# Patient Record
Sex: Male | Born: 1935 | Race: White | Hispanic: No | Smoking: Never smoker
Health system: Southern US, Community
[De-identification: ages and names within clinical notes are randomized; demographics above are authoritative.]

## PROBLEM LIST (undated history)

## (undated) DIAGNOSIS — R251 Tremor, unspecified: Secondary | ICD-10-CM

## (undated) DIAGNOSIS — R7303 Prediabetes: Secondary | ICD-10-CM

## (undated) DIAGNOSIS — M1712 Unilateral primary osteoarthritis, left knee: Secondary | ICD-10-CM

## (undated) DIAGNOSIS — Z87438 Personal history of other diseases of male genital organs: Secondary | ICD-10-CM

## (undated) DIAGNOSIS — I2109 ST elevation (STEMI) myocardial infarction involving other coronary artery of anterior wall: Secondary | ICD-10-CM

## (undated) DIAGNOSIS — K4020 Bilateral inguinal hernia, without obstruction or gangrene, not specified as recurrent: Secondary | ICD-10-CM

## (undated) DIAGNOSIS — E7800 Pure hypercholesterolemia, unspecified: Secondary | ICD-10-CM

## (undated) DIAGNOSIS — I739 Peripheral vascular disease, unspecified: Secondary | ICD-10-CM

## (undated) DIAGNOSIS — E039 Hypothyroidism, unspecified: Secondary | ICD-10-CM

## (undated) DIAGNOSIS — I2510 Atherosclerotic heart disease of native coronary artery without angina pectoris: Secondary | ICD-10-CM

## (undated) DIAGNOSIS — I071 Rheumatic tricuspid insufficiency: Secondary | ICD-10-CM

## (undated) DIAGNOSIS — N401 Enlarged prostate with lower urinary tract symptoms: Secondary | ICD-10-CM

## (undated) DIAGNOSIS — I10 Essential (primary) hypertension: Secondary | ICD-10-CM

## (undated) DIAGNOSIS — R350 Frequency of micturition: Secondary | ICD-10-CM

## (undated) DIAGNOSIS — Z862 Personal history of diseases of the blood and blood-forming organs and certain disorders involving the immune mechanism: Secondary | ICD-10-CM

## (undated) HISTORY — PX: EYE SURGERY: SHX253

## (undated) HISTORY — DX: Peripheral vascular disease, unspecified: I73.9

## (undated) HISTORY — DX: Unilateral primary osteoarthritis, left knee: M17.12

## (undated) HISTORY — PX: CATARACT EXTRACTION W/ INTRAOCULAR LENS  IMPLANT, BILATERAL: SHX1307

## (undated) HISTORY — DX: Essential (primary) hypertension: I10

## (undated) HISTORY — DX: Atherosclerotic heart disease of native coronary artery without angina pectoris: I25.10

## (undated) HISTORY — DX: Pure hypercholesterolemia, unspecified: E78.00

## (undated) HISTORY — DX: Hypothyroidism, unspecified: E03.9

## (undated) HISTORY — DX: Benign prostatic hyperplasia with lower urinary tract symptoms: N40.1

## (undated) HISTORY — DX: Personal history of diseases of the blood and blood-forming organs and certain disorders involving the immune mechanism: Z86.2

## (undated) HISTORY — DX: Prediabetes: R73.03

## (undated) HISTORY — DX: ST elevation (STEMI) myocardial infarction involving other coronary artery of anterior wall: I21.09

## (undated) HISTORY — DX: Rheumatic tricuspid insufficiency: I07.1

## (undated) HISTORY — DX: Frequency of micturition: R35.0

## (undated) HISTORY — PX: HERNIA REPAIR: SHX51

---

## 2002-05-04 HISTORY — PX: CORONARY ANGIOPLASTY: SHX604

## 2003-03-20 ENCOUNTER — Other Ambulatory Visit: Payer: Self-pay

## 2003-03-21 ENCOUNTER — Other Ambulatory Visit: Payer: Self-pay

## 2005-04-14 ENCOUNTER — Ambulatory Visit: Payer: Self-pay | Admitting: Internal Medicine

## 2005-04-14 IMAGING — US US EXTREM LOW VENOUS*L*
1 series · 17 of 24 positions shown · non-contrast
Comparison: none

REASON FOR EXAM: Left knee pain, evaluate DVT. Call Report  [PHONE_NUMBER] Ext [[phone]]
COMMENTS:

PROCEDURE:     US  - US DOPPLER LOW EXTR LEFT  - [DATE]  [DATE]
RESULT:     Phasic, augmentation, and Valsalva flow waveforms are normal.
The LEFT femoral and popliteal veins show normal compressibility.  Doppler
examination shows no occlusion or evidence of deep venous thrombosis.

[Series 1: us extrem low venous*left* · 17 of 28 slices shown]
[im 1/28]
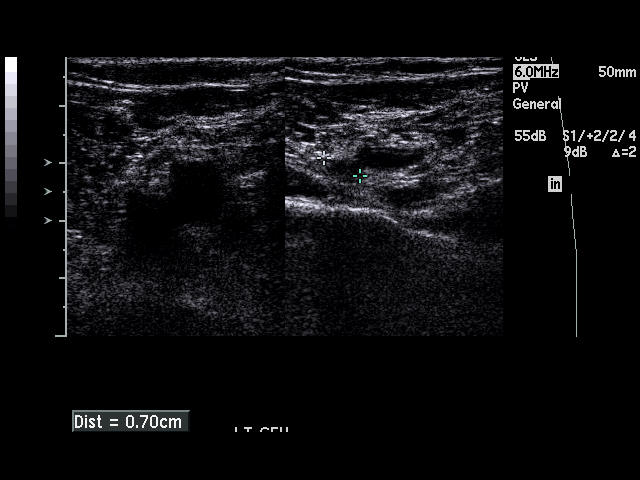
[im 3/28]
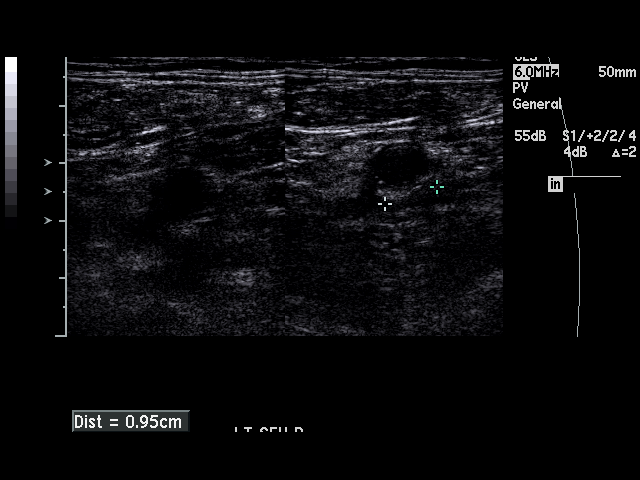
[im 4/28]
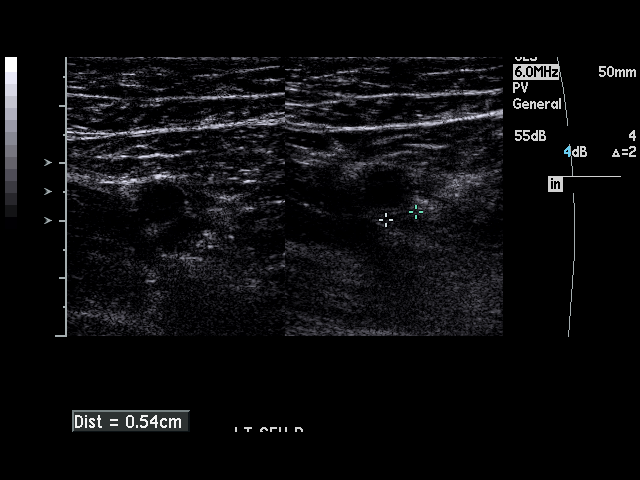
[im 5/28]
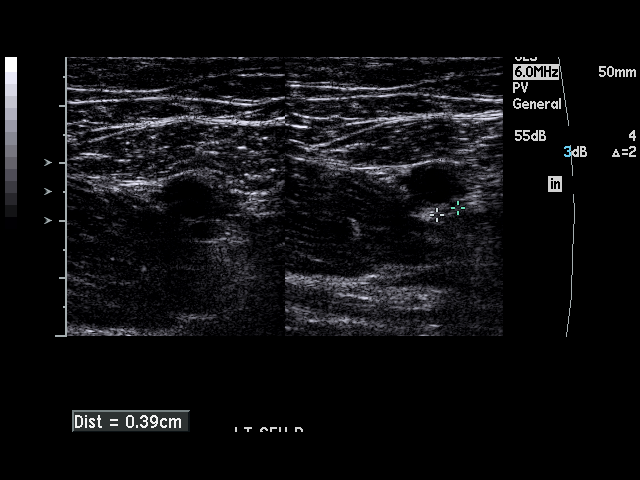
[im 8/28]
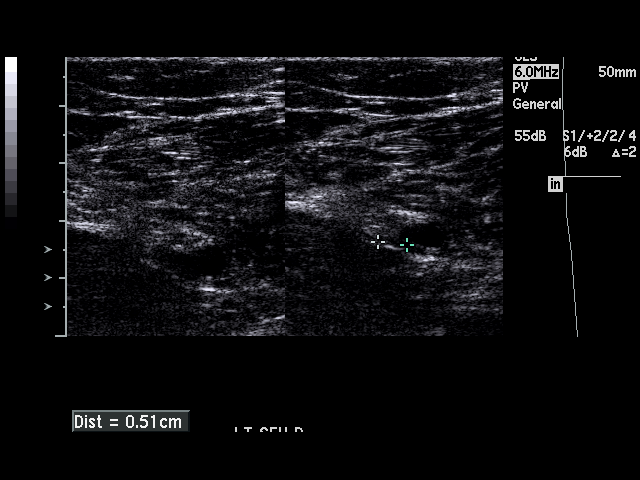
[im 9/28]
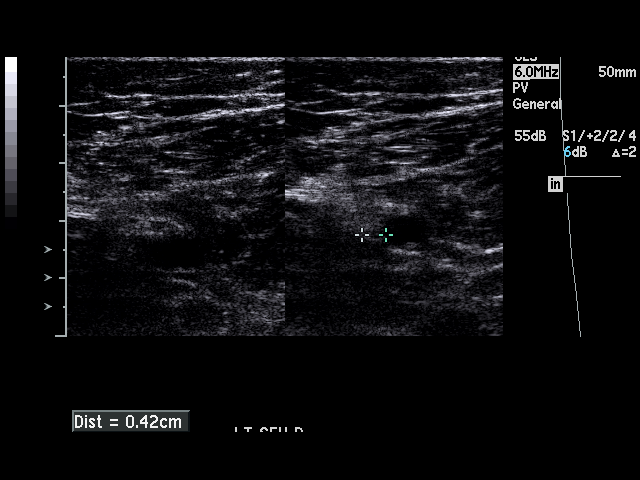
[im 11/28]
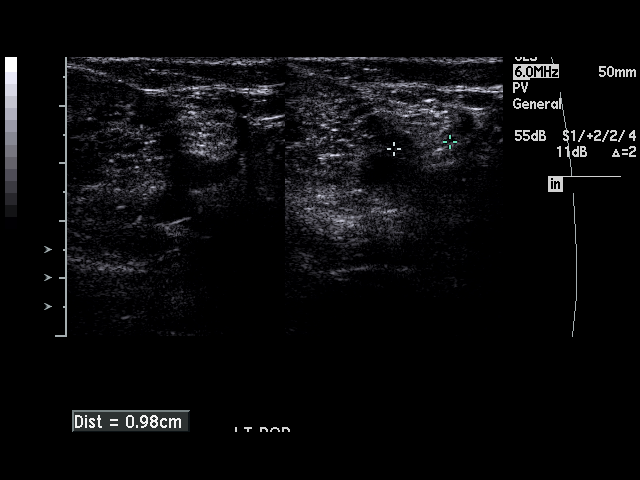
[im 12/28]
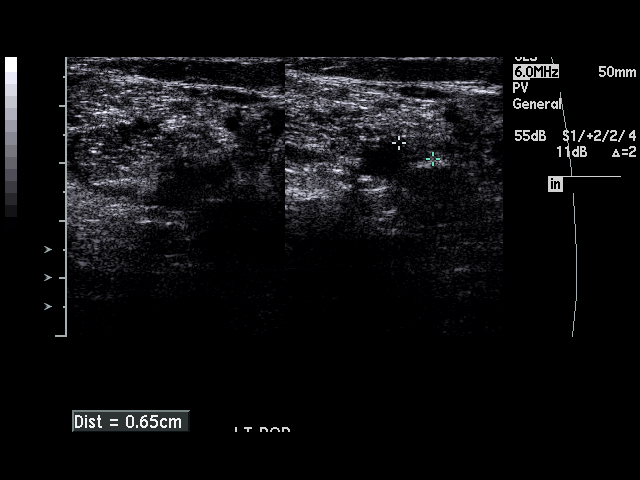
[im 15/28]
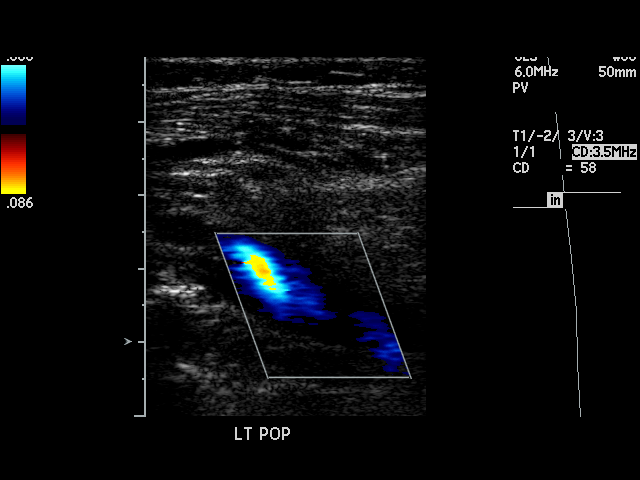
[im 16/28]
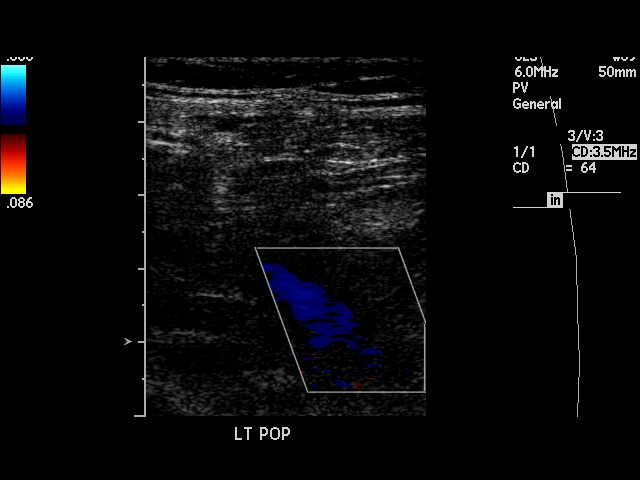
[im 17/28]
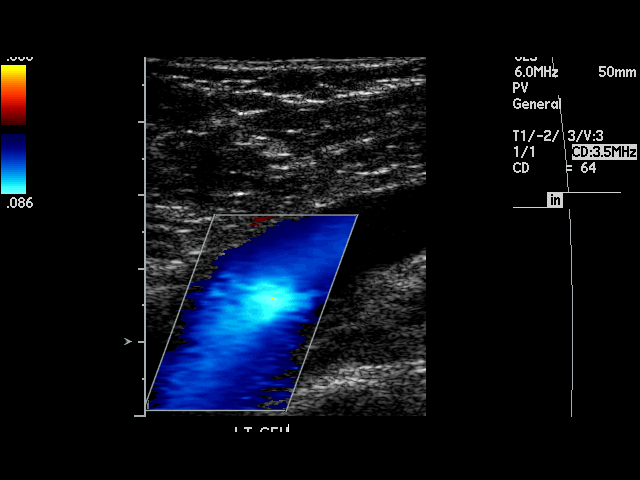
[im 19/28]
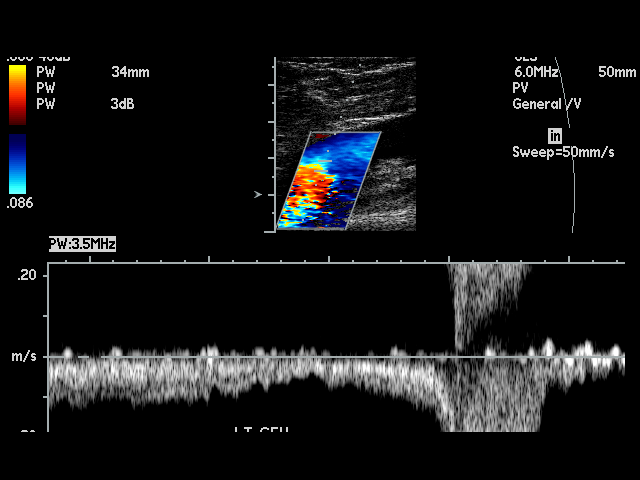
[im 20/28]
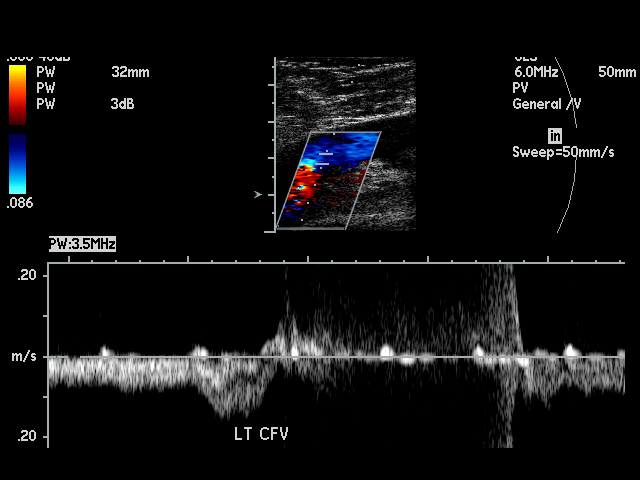
[im 23/28]
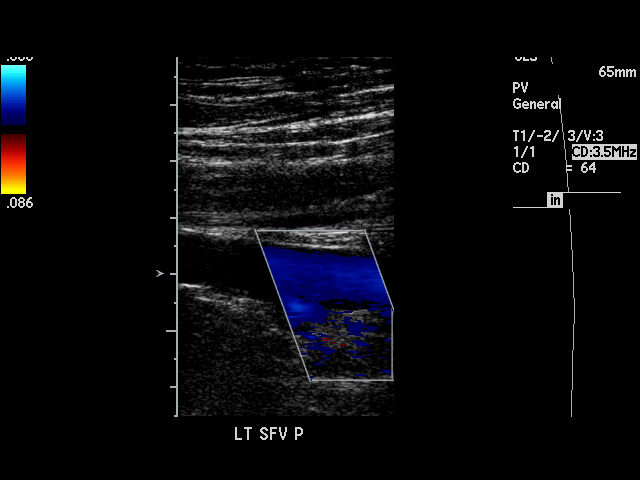
[im 24/28]
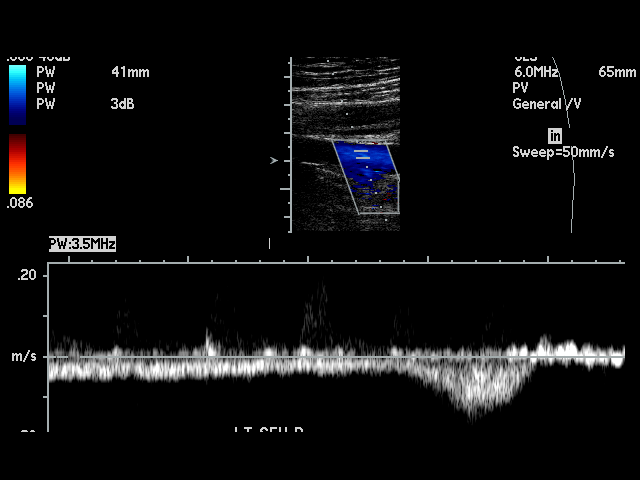
[im 25/28]
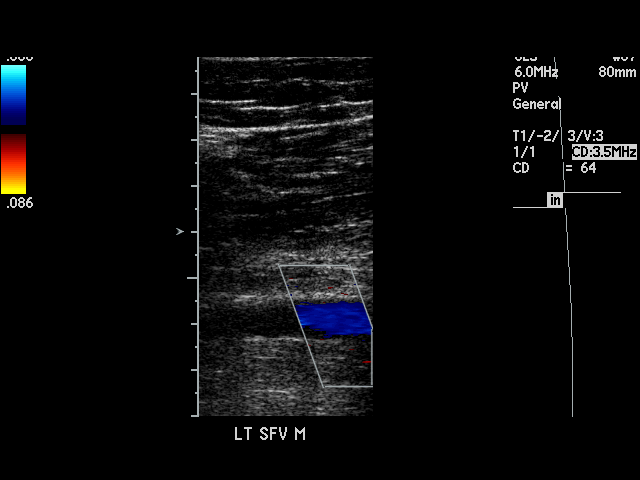
[im 28/28]
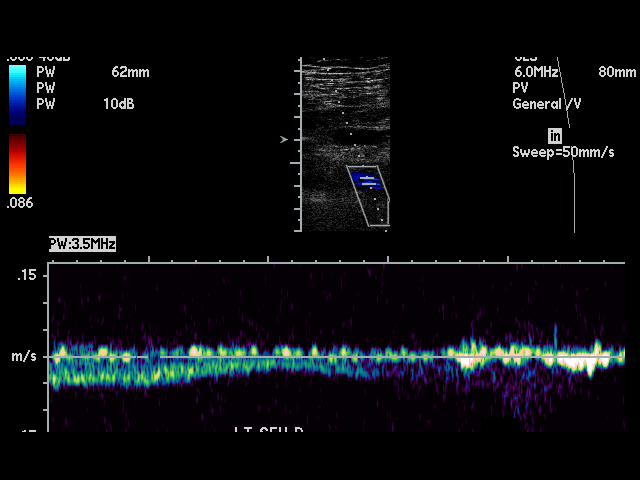

[17 of 24 positions shown; findings below may reference images not displayed]

IMPRESSION: No significant abnormalities are noted.

## 2010-04-10 ENCOUNTER — Emergency Department: Payer: Self-pay

## 2010-04-11 ENCOUNTER — Observation Stay: Payer: Self-pay | Admitting: Internal Medicine

## 2010-05-01 ENCOUNTER — Ambulatory Visit: Payer: Self-pay | Admitting: Unknown Physician Specialty

## 2010-05-01 IMAGING — CT CT ABD-PELV W/ CM
1 of 2 series · 15 of 32 positions shown, 19 images · non-contrast
Comparison: none

REASON FOR EXAM: epigastric pain nausea alone
COMMENTS:

[Series 2: abdomen · axial · 0.76mm/px · z∈[-110,+330]mm · 15 of 98 slices shown, 19 images]
[im 5/98  soft-tissue]
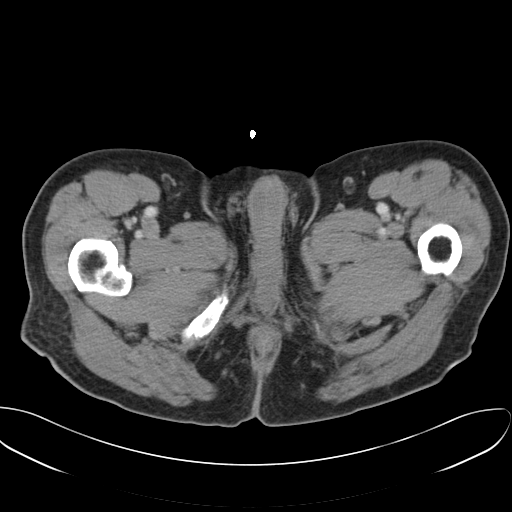
[im 5/98  bone]
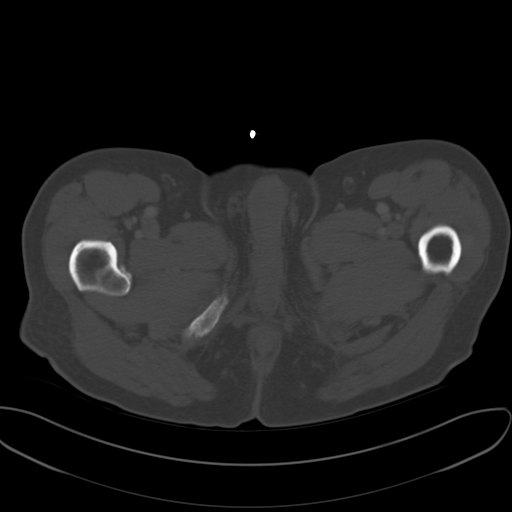
[im 13/98  soft-tissue]
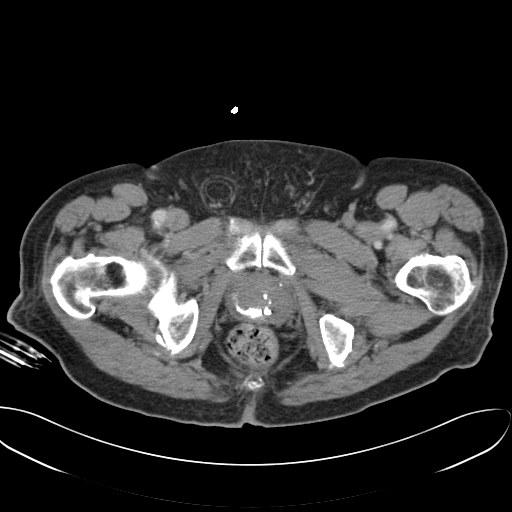
[im 21/98  soft-tissue]
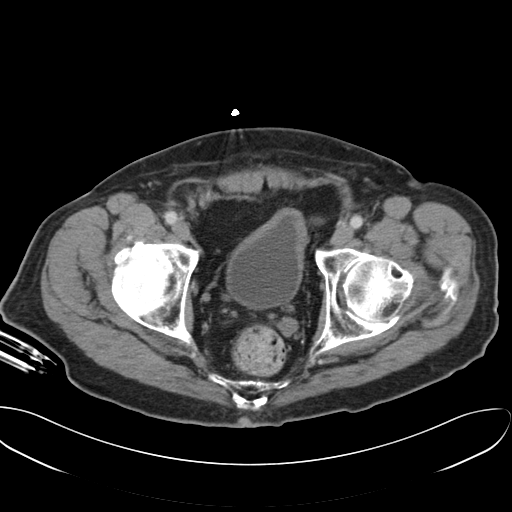
[im 29/98  soft-tissue]
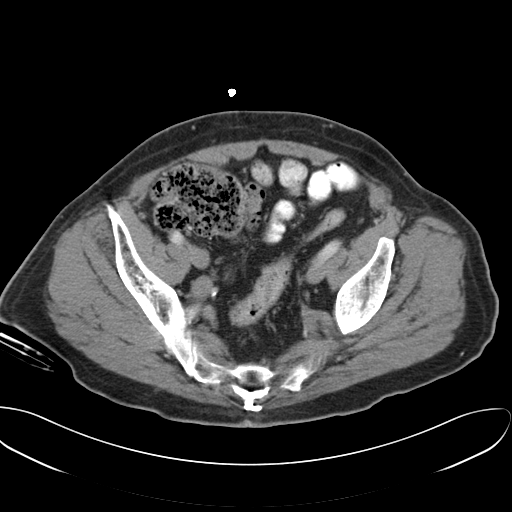
[im 33/98  soft-tissue]
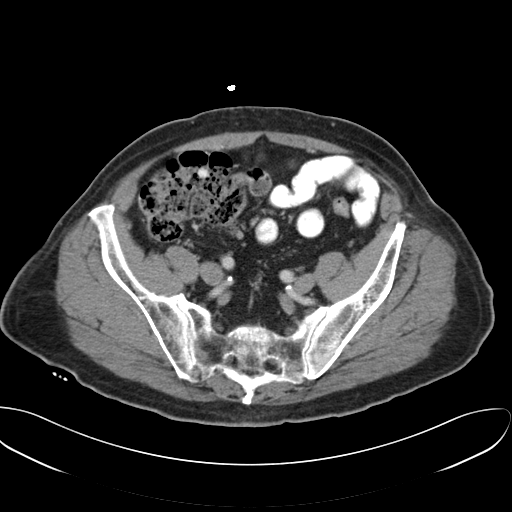
[im 41/98  soft-tissue]
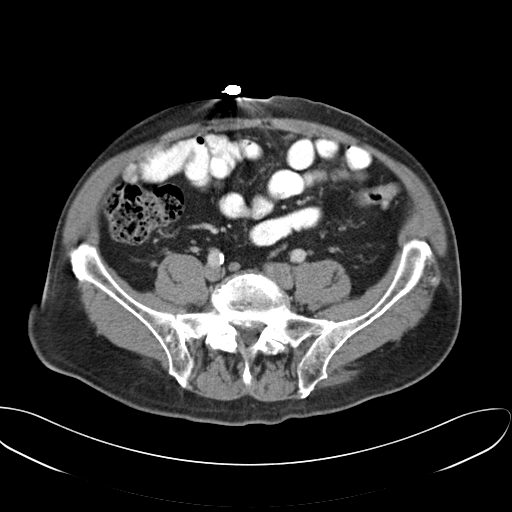
[im 49/98  soft-tissue]
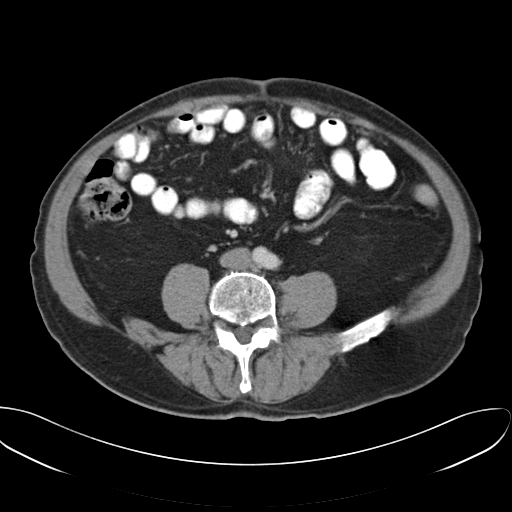
[im 57/98  soft-tissue]
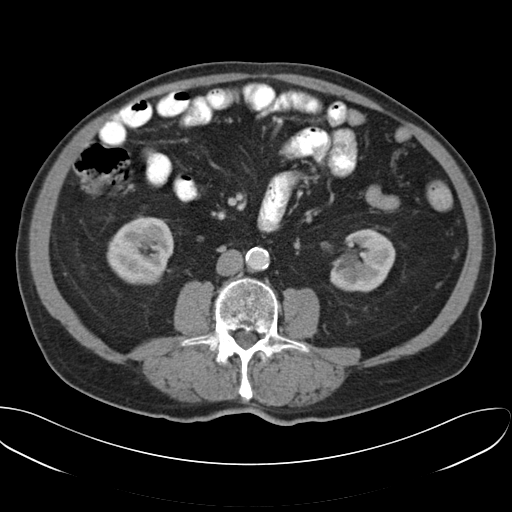
[im 65/98  soft-tissue]
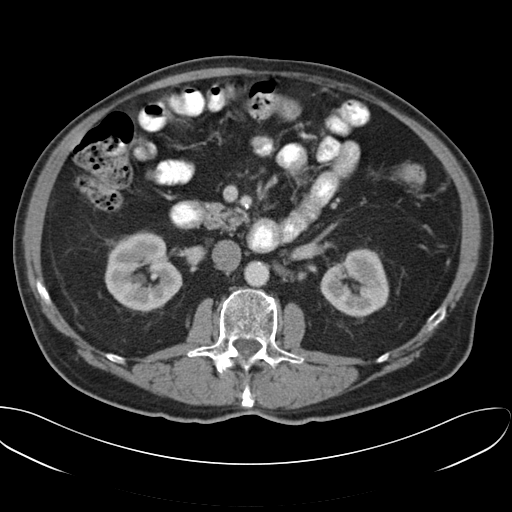
[im 65/98  bone]
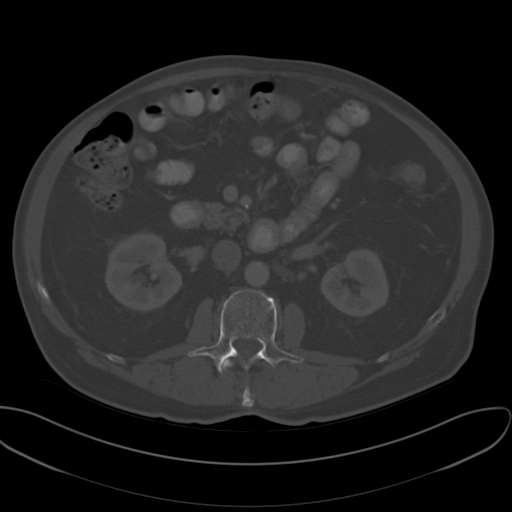
[im 69/98  soft-tissue]
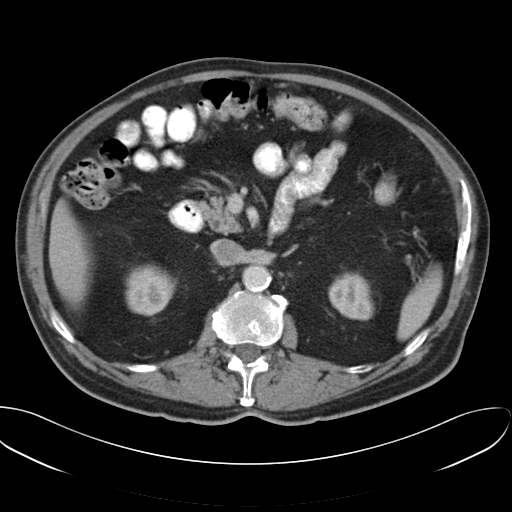
[im 77/98  soft-tissue]
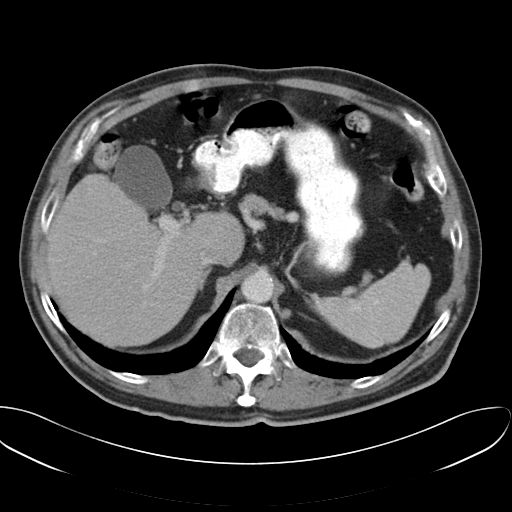
[im 81/98  lung]
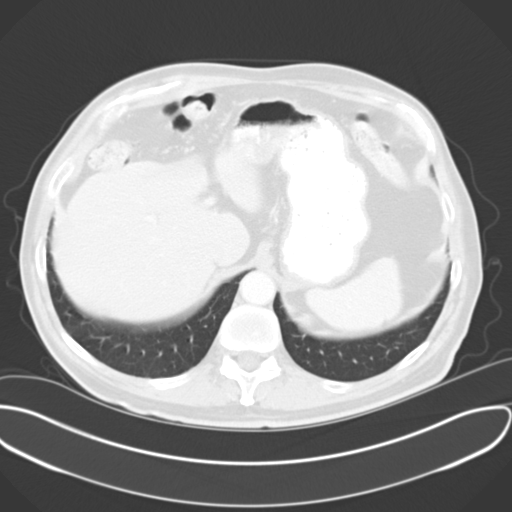
[im 85/98  soft-tissue]
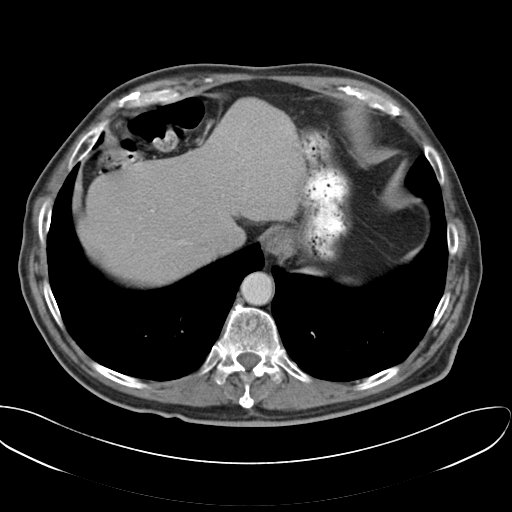
[im 85/98  lung]
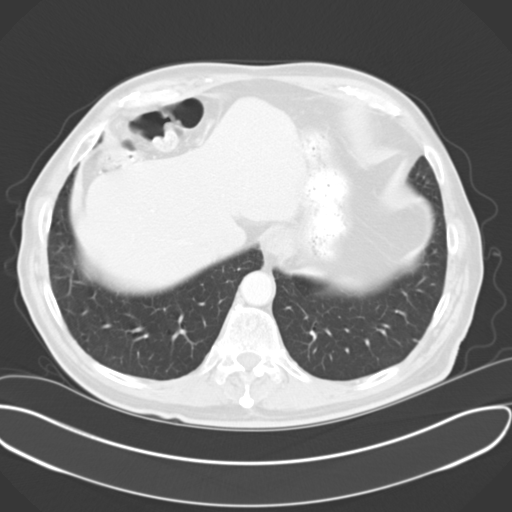
[im 89/98  lung]
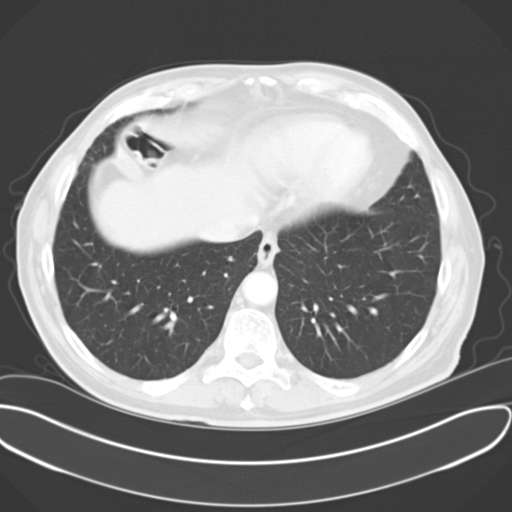
[im 93/98  soft-tissue]
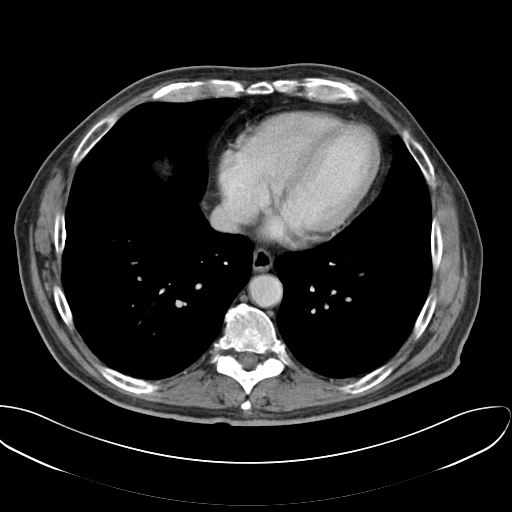
[im 93/98  lung]
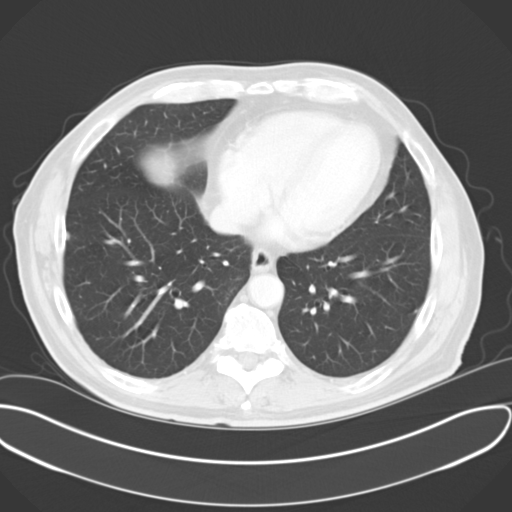

[15 of 32 positions shown; findings below may reference images not displayed]

PROCEDURE:     CT  - CT ABDOMEN / PELVIS  W  - [DATE] [DATE]

RESULT:     Axial CT scanning was performed through the chest at 5 mm
intervals and slice thicknesses following intravenous administration of 85
cc of Isovue 300 and administration of oral contrast material. Review of
multiplanar reconstructed images was performed separately on the via monitor.

The stomach is partially distended with contrast. The orally administered
contrast has traversed much of the small bowel but has not yet reached the
colon. The stool and gas pattern within the colon does not suggest
obstruction. There are bilateral inguinal hernias containing only fat. There
is no  umbilical hernia.

The liver exhibits no focal mass nor ductal dilation. The gallbladder is
adequately distended. There is subtle radiodensity in its dependent portion
which may reflect an enhancing vessel versus a tiny stone. I see no
gallbladder wall thickening or pericholecystic fluid. The pancreas, spleen,
adrenal glands, and kidneys exhibit no acute abnormality. The caliber of the
abdominal aorta is normal. I see no periaortic nor pericaval
lymphadenopathy. Within the pelvis the mildly enlarged prostate gland
produces an impression upon the urinary bladder base. I see no free pelvic
fluid. The seminal vesicles are normal in appearance for age.

The lung bases are clear. The lumbar vertebral bodies are preserved in
height. There is disc space narrowing at L4-L5.
IMPRESSION: 1. I do not see evidence of ileus or bowel obstruction.
2. There is no evidence of acute urinary tract abnormality.
3. There are small bilateral inguinal hernias containing only fat.
4. There is a tiny radiodensity in the dependent portion of the gallbladder
seen on images 27 and 28 which may reflect a tiny adherent stone versus
enhancing vessel. Gallbladder ultrasound would be useful.
4. There is no evidence of an abdominal aortic aneurysm nor of
intra-abdominal or pelvic lymphadenopathy.

## 2010-05-21 ENCOUNTER — Ambulatory Visit: Payer: Self-pay | Admitting: Unknown Physician Specialty

## 2010-05-21 IMAGING — US ABDOMEN ULTRASOUND
1 series · 17 of 25 positions shown · non-contrast
Comparison: none

REASON FOR EXAM: epigastric pain iron deficiency
COMMENTS:

[Series 1: abdomen ultrasound · 17 of 77 slices shown]
[im 1/77]
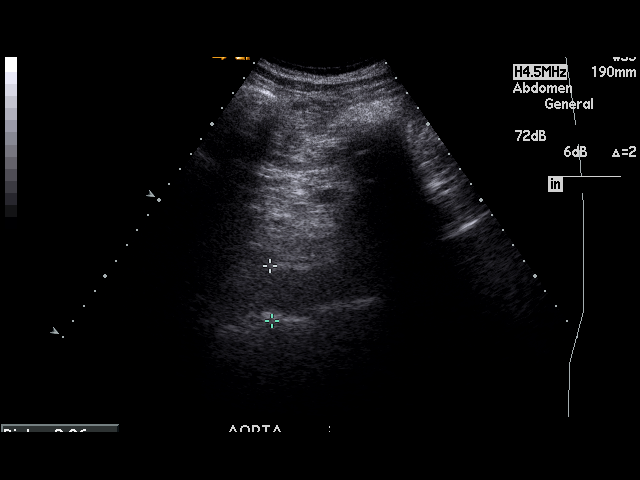
[im 7/77]
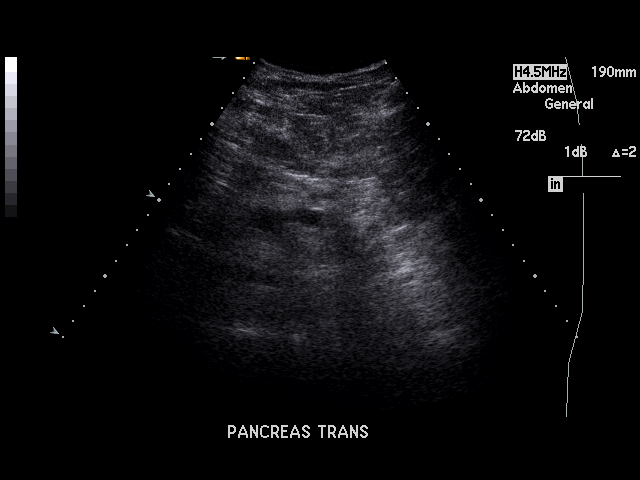
[im 10/77]
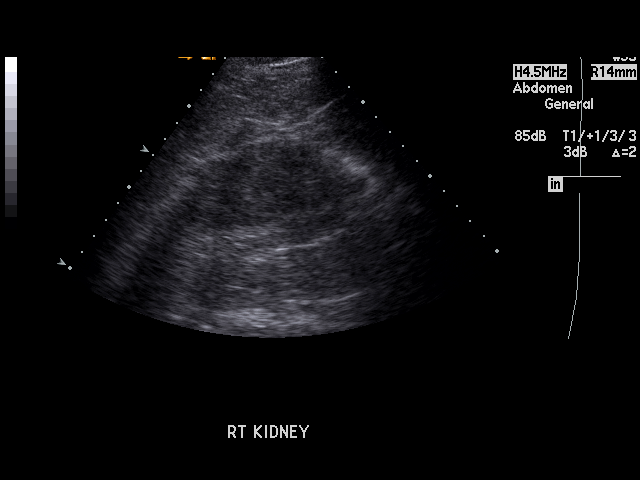
[im 16/77]
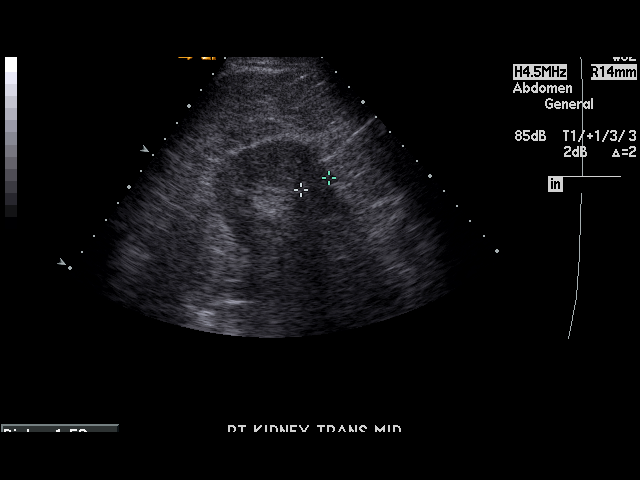
[im 20/77]
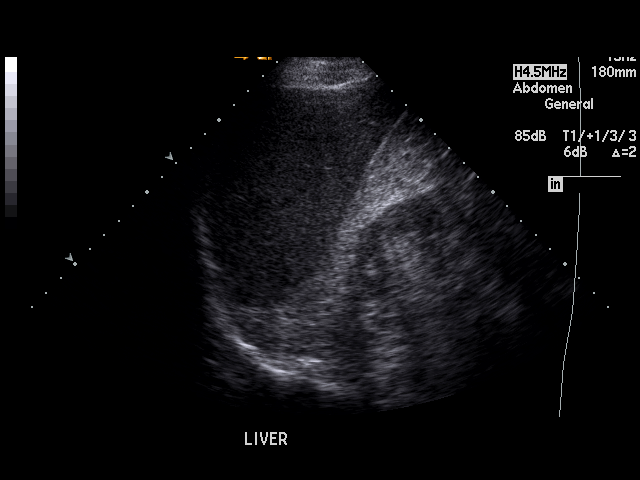
[im 26/77]
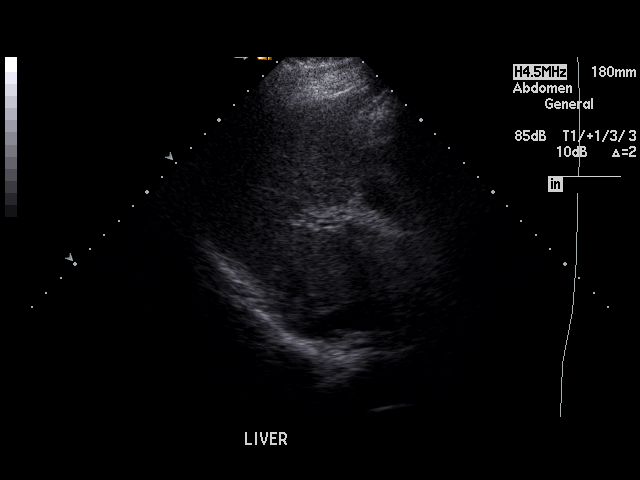
[im 29/77]
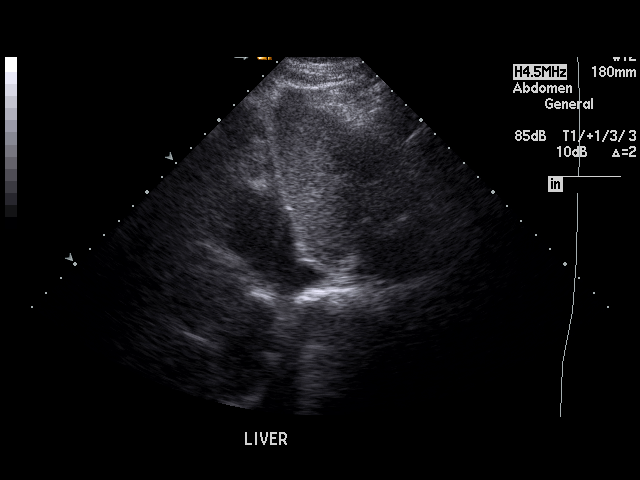
[im 35/77]
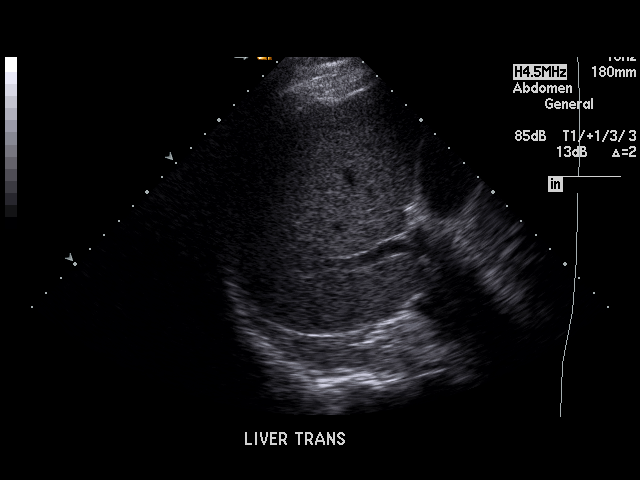
[im 39/77]
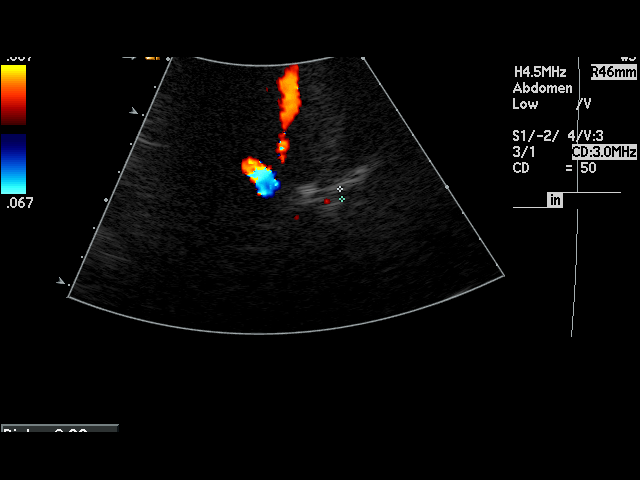
[im 42/77]
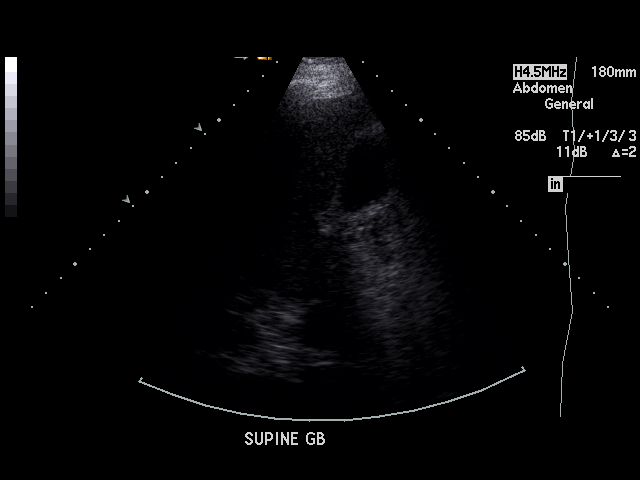
[im 48/77]
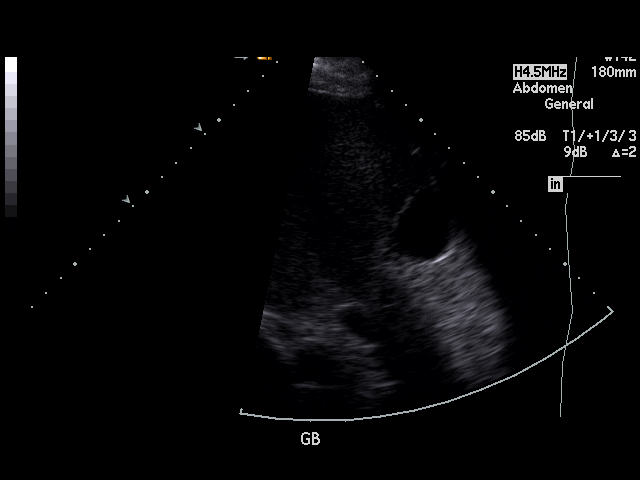
[im 51/77]
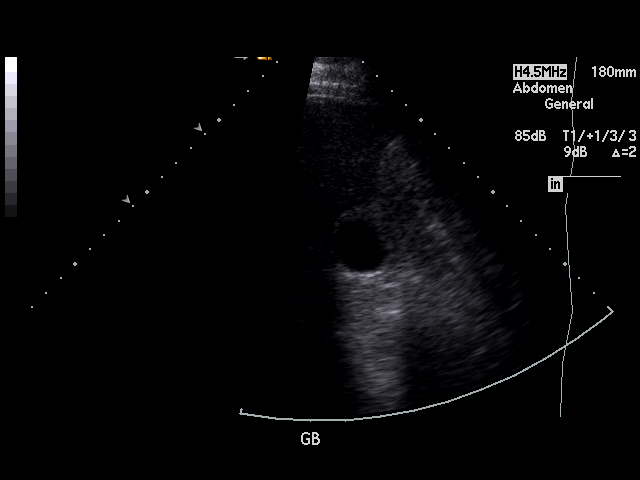
[im 58/77]
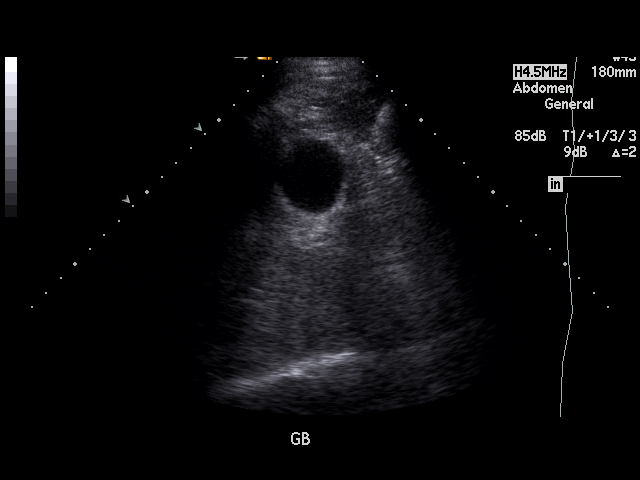
[im 61/77]
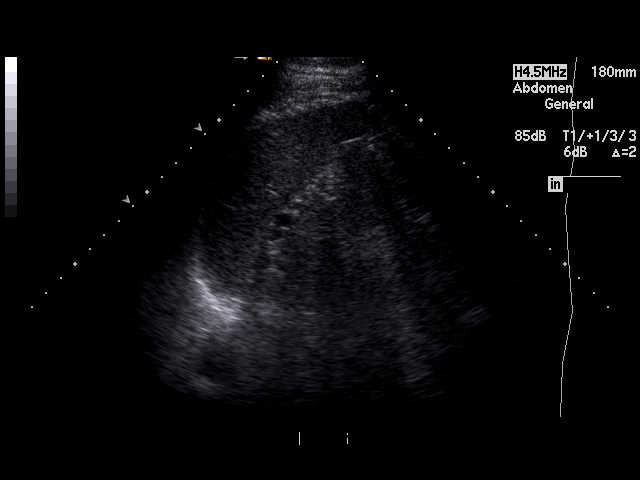
[im 67/77]
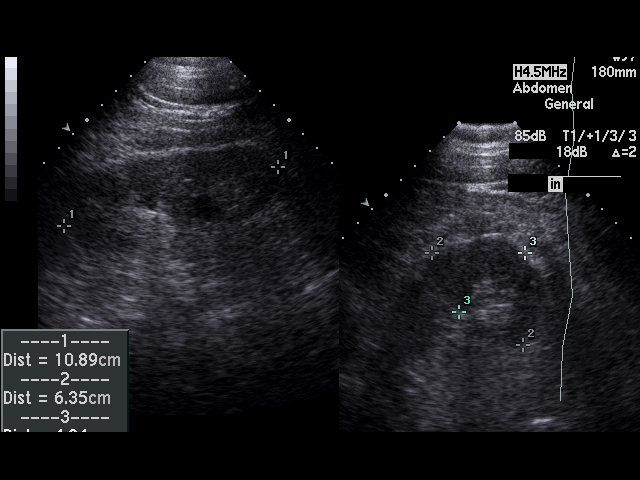
[im 70/77]
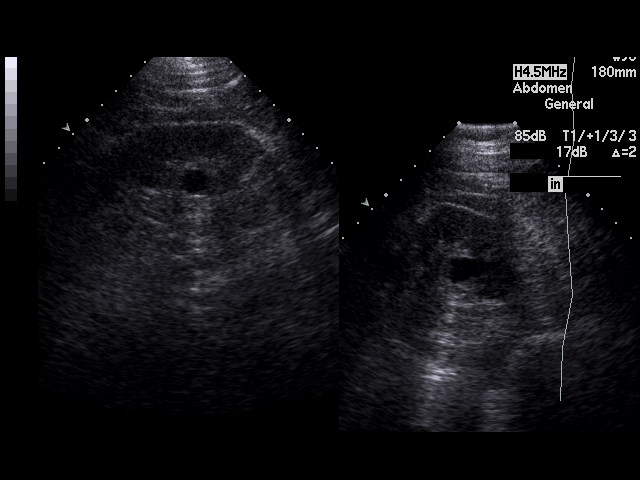
[im 77/77]
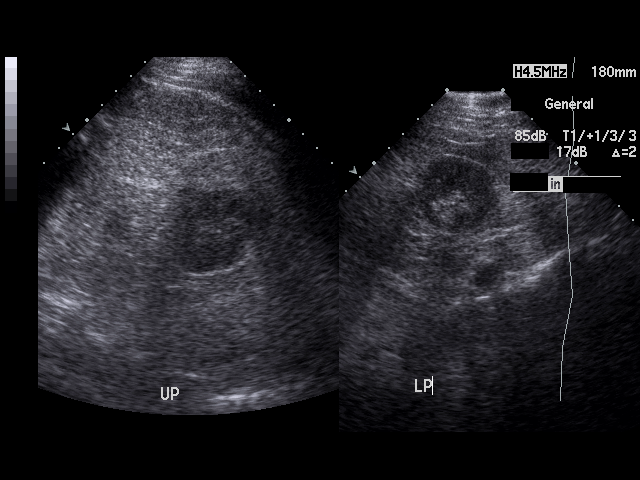

[17 of 25 positions shown; findings below may reference images not displayed]

PROCEDURE:     US  - US ABDOMEN GENERAL SURVEY  - [DATE]  [DATE]

RESULT:     Sonographic evaluation of the abdomen demonstrates the aorta
tapers normally. The pancreas is predominantly obscured by bowel gas. The
right kidney measures 11.28 cm in length possibly containing a
nonobstructing stone in the midpole region. There is a right renal cortical
thickness of 1.52 cm. The hepatic echotexture appears normal. The portal
venous flow is normal. The common bile duct diameter is 3.3 mm. No
gallstones are evident. The gallbladder wall thickness is 2.2 mm. There is
no sonographic Murphy's sign. The spleen is unremarkable. The left kidney
length is 10.89 cm with a mid left renal cyst measuring 1.48 x 1.28 x
cm. The left renal cortical thickness is 1.55 cm.
IMPRESSION: 1. Limited visualization of the pancreas.

2. Mid left renal cyst.

3. Cannot exclude a small, nonobstructing right renal stone.

## 2010-05-30 ENCOUNTER — Ambulatory Visit: Payer: Self-pay | Admitting: Unknown Physician Specialty

## 2012-02-08 ENCOUNTER — Ambulatory Visit: Payer: Self-pay | Admitting: Ophthalmology

## 2012-02-08 LAB — HEMOGLOBIN: HGB: 13.3 g/dL (ref 13.0–18.0)

## 2012-02-23 ENCOUNTER — Ambulatory Visit: Payer: Self-pay | Admitting: Ophthalmology

## 2012-03-16 ENCOUNTER — Ambulatory Visit: Payer: Self-pay | Admitting: Ophthalmology

## 2012-03-16 LAB — HEMOGLOBIN: HGB: 13.2 g/dL (ref 13.0–18.0)

## 2012-03-16 LAB — POTASSIUM: Potassium: 4.5 mmol/L (ref 3.5–5.1)

## 2012-04-05 ENCOUNTER — Ambulatory Visit: Payer: Self-pay | Admitting: Ophthalmology

## 2013-02-09 ENCOUNTER — Emergency Department: Payer: Self-pay | Admitting: Emergency Medicine

## 2013-02-09 IMAGING — US US EXTREM LOW VENOUS*L*
1 series · 14 of 24 positions shown · non-contrast
Comparison: none

REASON FOR EXAM: pain to left upper leg
COMMENTS:

PROCEDURE:     US  - US DOPPLER LOW EXTR LEFT  - [DATE]  [DATE]
RESULT:     Comparison: None

[Series 1: us extrem low venous*left* · 0.11mm/px · 14 of 24 slices shown]
[im 1/24]
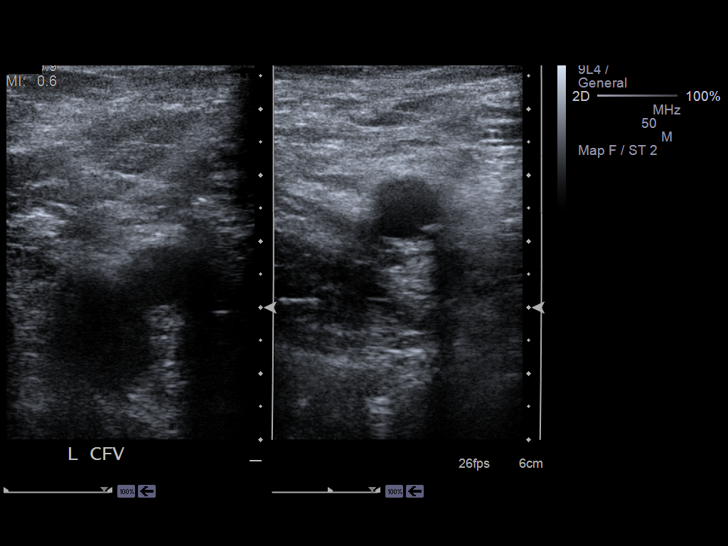
[im 3/24]
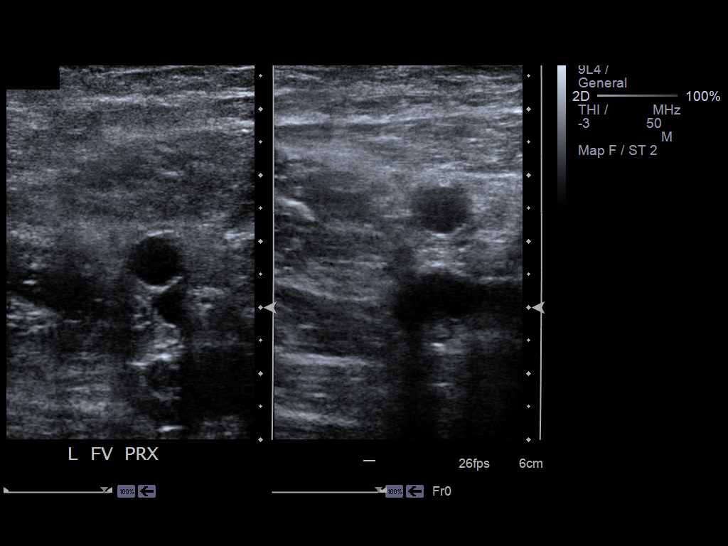
[im 5/24]
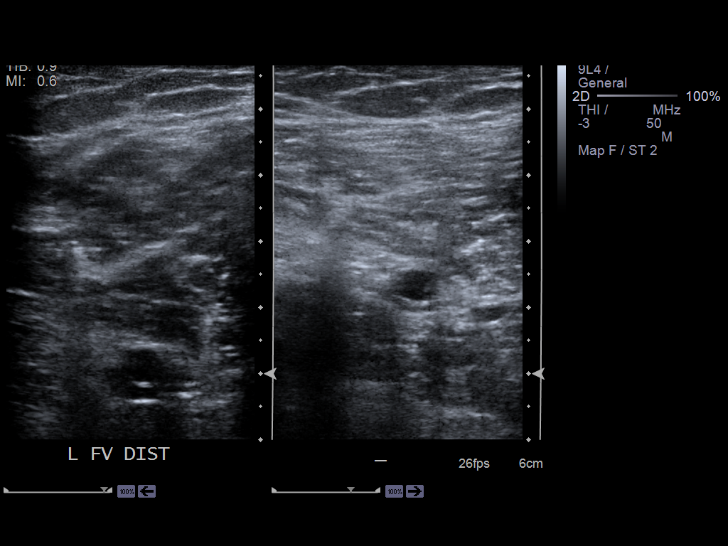
[im 7/24]
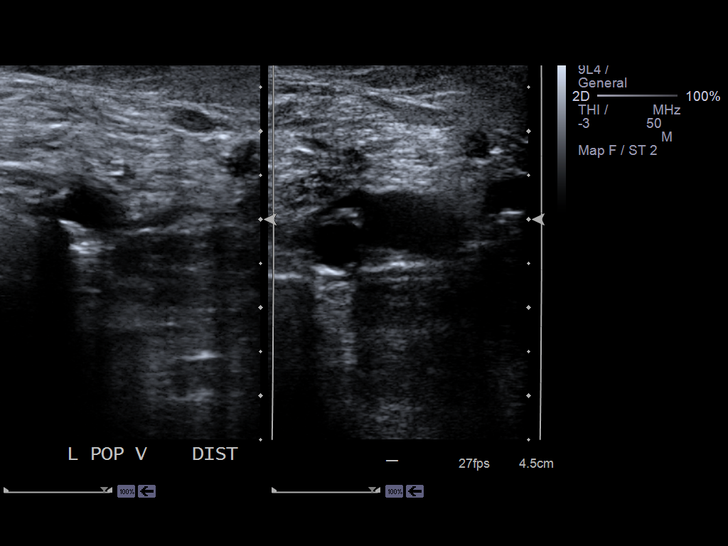
[im 8/24]
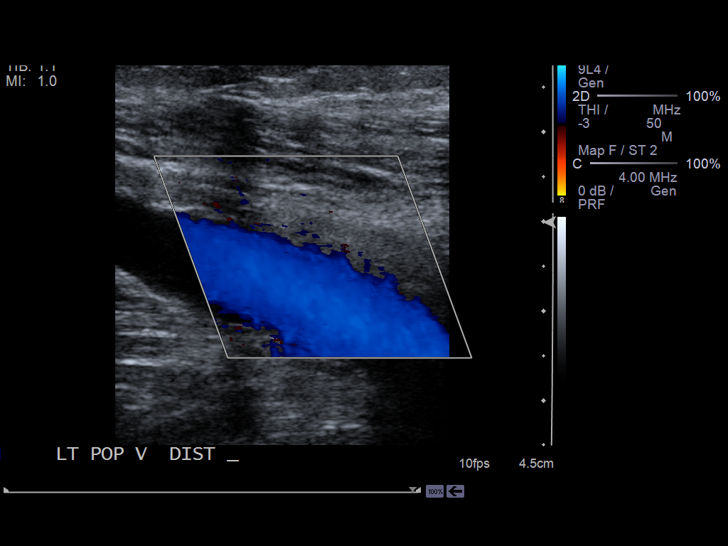
[im 10/24]
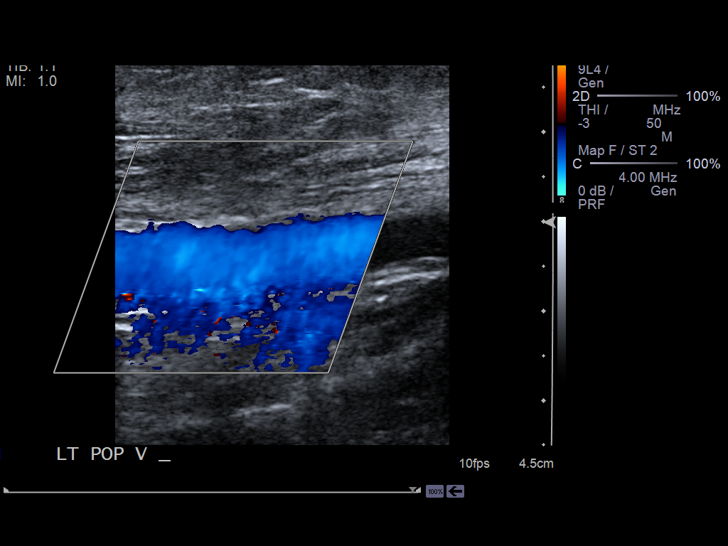
[im 12/24]
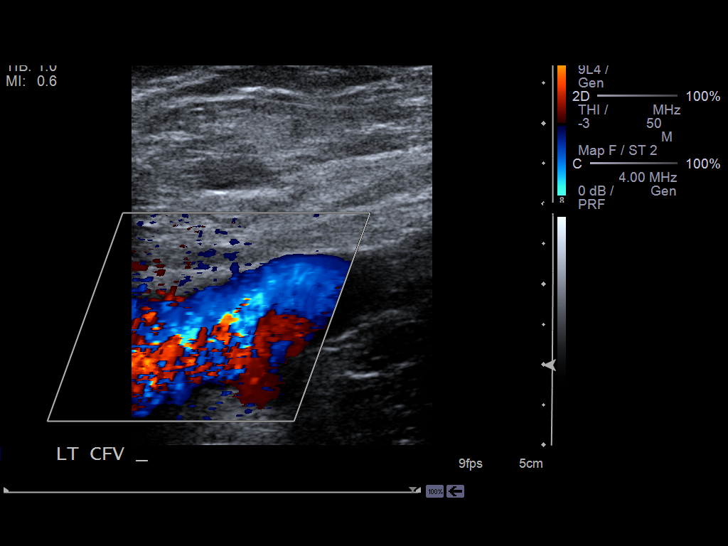
[im 13/24]
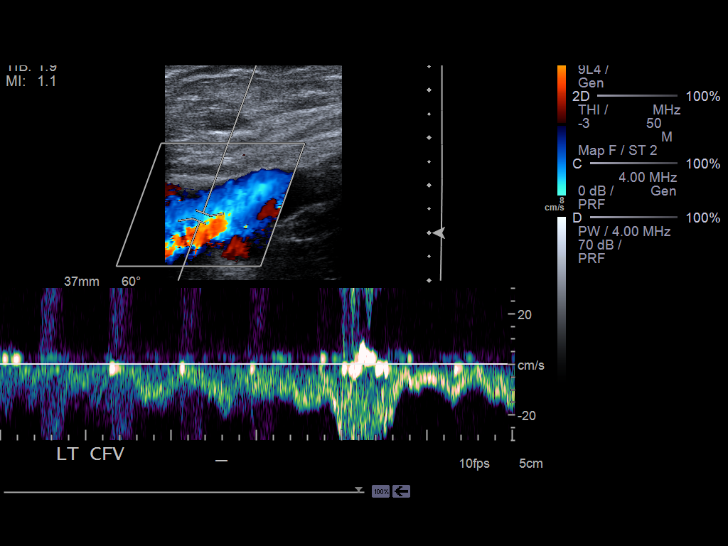
[im 15/24]
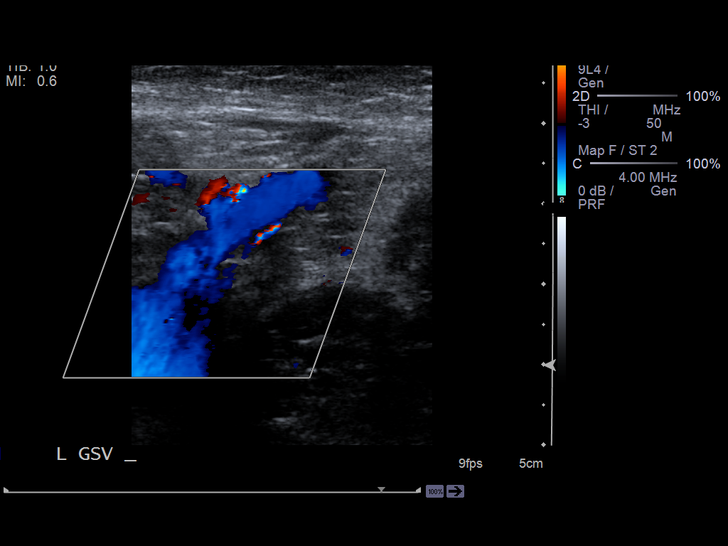
[im 17/24]
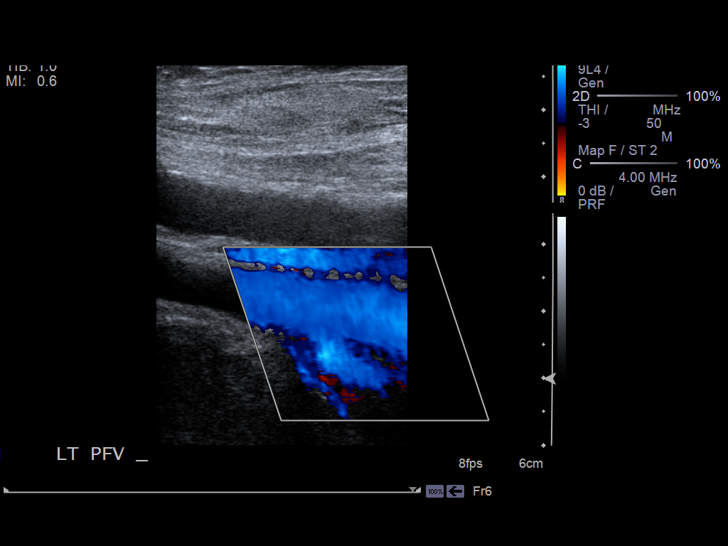
[im 19/24]
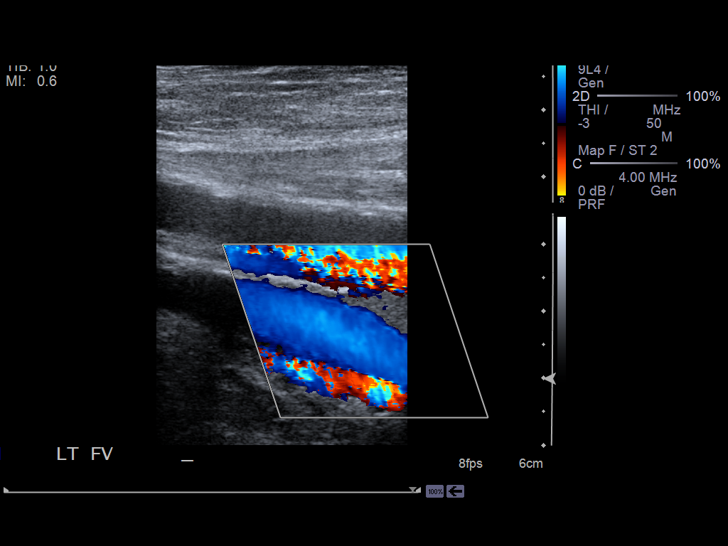
[im 20/24]
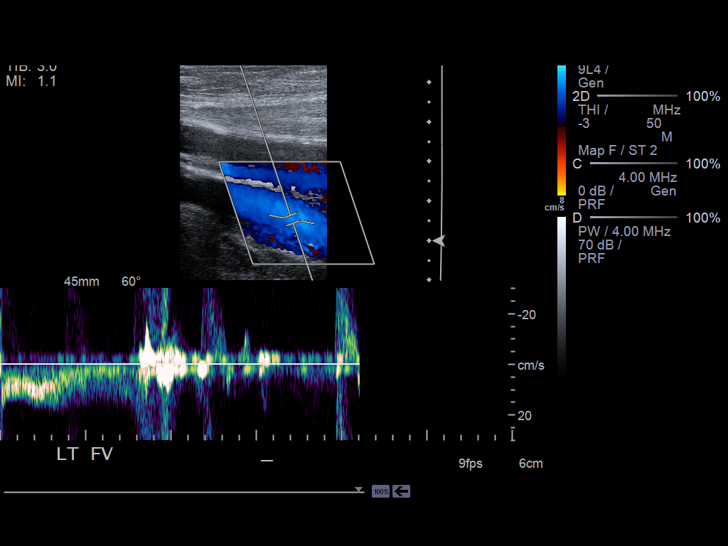
[im 22/24]
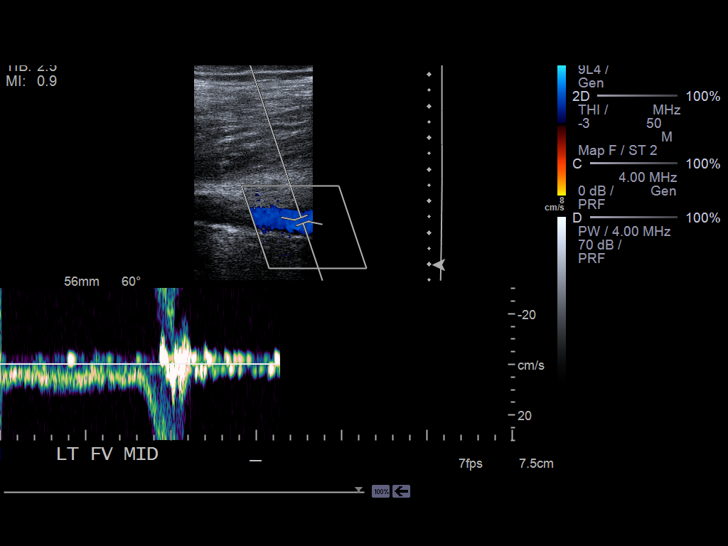
[im 24/24]
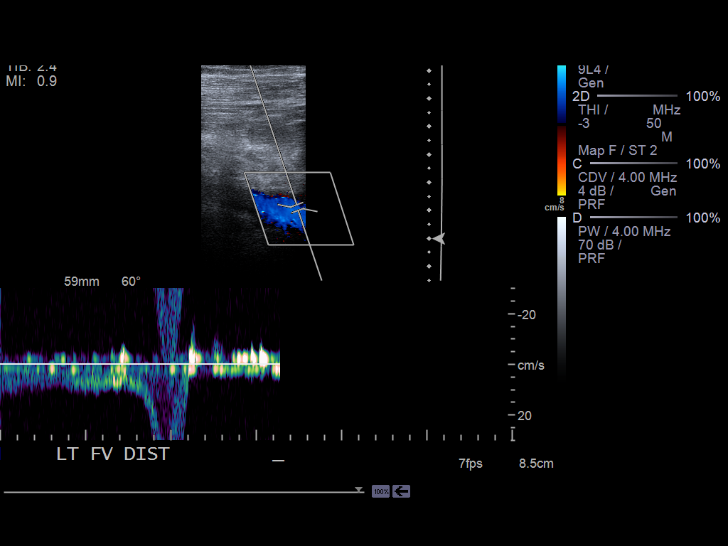

[14 of 24 positions shown; findings below may reference images not displayed]

FINDINGS: Multiple longitudinal and transverse gray-scale as well as color
and spectral Doppler images of the left lower extremity veins were obtained
from the common femoral veins through the popliteal veins.

The left common femoral, greater saphenous, femoral, popliteal veins, and
venous trifurcation are patent, demonstrating normal color-flow and
compressibility. No intraluminal thrombus is identified.There is normal
respiratory variation and augmentation demonstrated at all vein levels.
IMPRESSION: No evidence of DVT in the left lower extremity.

[REDACTED]

## 2013-12-29 DIAGNOSIS — E7800 Pure hypercholesterolemia, unspecified: Secondary | ICD-10-CM

## 2013-12-29 DIAGNOSIS — I2510 Atherosclerotic heart disease of native coronary artery without angina pectoris: Secondary | ICD-10-CM

## 2013-12-29 DIAGNOSIS — I739 Peripheral vascular disease, unspecified: Secondary | ICD-10-CM

## 2013-12-29 DIAGNOSIS — I10 Essential (primary) hypertension: Secondary | ICD-10-CM

## 2013-12-29 DIAGNOSIS — I2109 ST elevation (STEMI) myocardial infarction involving other coronary artery of anterior wall: Secondary | ICD-10-CM

## 2013-12-29 HISTORY — DX: Atherosclerotic heart disease of native coronary artery without angina pectoris: I25.10

## 2013-12-29 HISTORY — DX: Peripheral vascular disease, unspecified: I73.9

## 2013-12-29 HISTORY — DX: Pure hypercholesterolemia, unspecified: E78.00

## 2013-12-29 HISTORY — DX: ST elevation (STEMI) myocardial infarction involving other coronary artery of anterior wall: I21.09

## 2013-12-29 HISTORY — DX: Essential (primary) hypertension: I10

## 2014-08-21 NOTE — Op Note (Signed)
PATIENT NAME:  Richard Castaneda, Richard Castaneda MR#:  [id number] DATE OF BIRTH:  10/16/1929  DATE OF PROCEDURE:  04/05/2012  PREOPERATIVE DIAGNOSIS: Visually significant cataract of the left eye.   POSTOPERATIVE DIAGNOSIS: Visually significant cataract of the left eye.   OPERATIVE PROCEDURE: Cataract extraction by phacoemulsification with implant of intraocular lens to left eye.   SURGEON: Galen ManilaWilliam Ashford Clouse, MD.   ANESTHESIA:  1. Managed anesthesia care.  2. Topical tetracaine drops followed by 2% Xylocaine jelly applied in the preoperative holding area.   COMPLICATIONS: None.   TECHNIQUE:  Stop and chop.   DESCRIPTION OF PROCEDURE: The patient was examined and consented in the preoperative holding area where the aforementioned topical anesthesia was applied to the left eye and then brought back to the Operating Room where the left eye was prepped and draped in the usual sterile ophthalmic fashion and a lid speculum was placed. A paracentesis was created with the side port blade and the anterior chamber was filled with viscoelastic. A near clear corneal incision was performed with the steel keratome. A continuous curvilinear capsulorrhexis was performed with a cystotome followed by the capsulorrhexis forceps. Hydrodissection and hydrodelineation were carried out with BSS on a blunt cannula. The lens was removed in a stop and chop technique and the remaining cortical material was removed with the irrigation-aspiration handpiece. The capsular bag was inflated with viscoelastic and the Tecnis ZCB00 22.0-diopter lens, serial number [id number] was placed in the capsular bag without complication. The remaining viscoelastic was removed from the eye with the irrigation-aspiration handpiece. The wounds were hydrated. The anterior chamber was flushed with Miostat and the eye was inflated to physiologic pressure. The wounds were found to be water tight. The eye was dressed with Vigamox. The patient was given protective glasses  to wear throughout the day and a shield with which to sleep tonight. The patient was also given drops with which to begin a drop regimen today and will follow-up with me in one day.  ____________________________ Jerilee FieldWilliam L. Mashayla Lavin, MD wlp:slb D: 04/05/2012 20:14:16 ET T: 04/06/2012 08:32:18 ET JOB#: [id number]  cc: Rolonda Pontarelli L. Nijae Doyel, MD, <Dictator> Jerilee FieldWILLIAM L Evette Diclemente MD ELECTRONICALLY SIGNED 04/07/2012 15:45

## 2014-08-21 NOTE — Op Note (Signed)
PATIENT NAME:  Richard Castaneda, Richard Castaneda MR#:  [id number] DATE OF BIRTH:  1929/09/03  DATE OF PROCEDURE:  02/23/2012  PREOPERATIVE DIAGNOSIS: Visually significant cataract of the right eye.   POSTOPERATIVE DIAGNOSIS: Visually significant cataract of the right eye.   OPERATIVE PROCEDURE: Cataract extraction by phacoemulsification with implant of intraocular lens to right eye.   SURGEON: Galen ManilaWilliam Dmarcus Decicco, MD.   ANESTHESIA:  1. Managed anesthesia care.  2. Topical tetracaine drops followed by 2% Xylocaine jelly applied in the preoperative holding area.   COMPLICATIONS: None.   TECHNIQUE:  Stop-and-chop    DESCRIPTION OF PROCEDURE: The patient was examined and consented in the preoperative holding area where the aforementioned topical anesthesia was applied to the right eye and then brought back to the Operating Room where the right eye was prepped and draped in the usual sterile ophthalmic fashion and a lid speculum was placed. A paracentesis was created with the side port blade and the anterior chamber was filled with viscoelastic. A near clear corneal incision was performed with the steel keratome. A continuous curvilinear capsulorrhexis was performed with a cystotome followed by the capsulorrhexis forceps. Hydrodissection and hydrodelineation were carried out with BSS on a blunt cannula. The lens was removed in a stop-and-chop technique and the remaining cortical material was removed with the irrigation-aspiration handpiece. The capsular bag was inflated with viscoelastic and the Tecnis ZCB00 21.5-diopter lens, serial number [id number] was placed in the capsular bag without complication. The remaining viscoelastic was removed from the eye with the irrigation-aspiration handpiece. The wounds were hydrated. The anterior chamber was flushed with Miostat and the eye was inflated to physiologic pressure. The wounds were found to be water tight. The eye was dressed with Vigamox. The patient was given protective  glasses to wear throughout the day and a shield with which to sleep tonight. The patient was also given drops with which to begin a drop regimen today and will follow-up with me in one day.   ____________________________ Jerilee FieldWilliam L. Ravinder Lukehart, MD wlp:drc D: 02/23/2012 18:27:42 ET T: 02/24/2012 07:09:54 ET JOB#: [id number]  cc: Azora Bonzo L. Aven Cegielski, MD, <Dictator> Jerilee FieldWILLIAM L Cherice Glennie MD ELECTRONICALLY SIGNED 02/24/2012 15:16

## 2014-09-12 DIAGNOSIS — I071 Rheumatic tricuspid insufficiency: Secondary | ICD-10-CM

## 2014-09-12 DIAGNOSIS — I340 Nonrheumatic mitral (valve) insufficiency: Secondary | ICD-10-CM | POA: Insufficient documentation

## 2014-09-12 HISTORY — DX: Rheumatic tricuspid insufficiency: I07.1

## 2015-09-09 DIAGNOSIS — Z862 Personal history of diseases of the blood and blood-forming organs and certain disorders involving the immune mechanism: Secondary | ICD-10-CM

## 2015-09-09 HISTORY — DX: Personal history of diseases of the blood and blood-forming organs and certain disorders involving the immune mechanism: Z86.2

## 2016-03-09 DIAGNOSIS — Z7185 Encounter for immunization safety counseling: Secondary | ICD-10-CM | POA: Insufficient documentation

## 2016-03-09 DIAGNOSIS — Z7189 Other specified counseling: Secondary | ICD-10-CM | POA: Insufficient documentation

## 2016-03-09 DIAGNOSIS — Z0000 Encounter for general adult medical examination without abnormal findings: Secondary | ICD-10-CM | POA: Insufficient documentation

## 2016-08-26 DIAGNOSIS — M1712 Unilateral primary osteoarthritis, left knee: Secondary | ICD-10-CM

## 2016-08-26 HISTORY — DX: Unilateral primary osteoarthritis, left knee: M17.12

## 2016-10-19 DIAGNOSIS — N401 Enlarged prostate with lower urinary tract symptoms: Secondary | ICD-10-CM

## 2016-10-19 DIAGNOSIS — R350 Frequency of micturition: Secondary | ICD-10-CM

## 2016-10-19 HISTORY — DX: Benign prostatic hyperplasia with lower urinary tract symptoms: N40.1

## 2016-10-27 ENCOUNTER — Other Ambulatory Visit: Payer: Self-pay

## 2016-11-16 ENCOUNTER — Encounter: Payer: Self-pay | Admitting: Urology

## 2016-11-16 ENCOUNTER — Ambulatory Visit (INDEPENDENT_AMBULATORY_CARE_PROVIDER_SITE_OTHER): Payer: Medicare Other | Admitting: Urology

## 2016-11-16 VITALS — BP 160/63 | HR 70 | Ht 64.0 in | Wt 150.0 lb

## 2016-11-16 DIAGNOSIS — N3941 Urge incontinence: Secondary | ICD-10-CM

## 2016-11-16 DIAGNOSIS — E039 Hypothyroidism, unspecified: Secondary | ICD-10-CM

## 2016-11-16 DIAGNOSIS — R7303 Prediabetes: Secondary | ICD-10-CM

## 2016-11-16 HISTORY — DX: Hypothyroidism, unspecified: E03.9

## 2016-11-16 HISTORY — DX: Prediabetes: R73.03

## 2016-11-16 LAB — BLADDER SCAN AMB NON-IMAGING

## 2016-11-16 NOTE — Progress Notes (Signed)
11/16/2016 12:05 PM   Richard Castaneda [birth year] [id number]  Referring provider: Marisue Castaneda, [place], MD [phone] [facility] [place], [place] [id number]  Chief Complaint  Patient presents with  . Urinary Incontinence    New Patient    HPI: 83 year old male who was seen today for further evaluation and management of urinary frequency and urgency. His most bothersome complaint is nocturia. The patient has a history of BPH and instructed voiding symptoms. He is been seen by Dr. Lonna CobbStoioff at least 5 years ago. He takes both Flomax and finasteride for his voiding symptoms. He is only recently been on the Flomax, he feels that this is made his symptoms worse. The patient states that he at times has a difficult time getting his stream started, does not O's feels of the empty his bladder completely, and at night has severe nocturia. At times, he gets up every 30 minutes to void. He is losing significant amounts of sleep. The patient states that he feels like he makes more urine at night, and uses a urinal at the bedside at night which is often full. The patient denies any lower extremity swelling. He also denies obstructive sleep apnea or significant snoring at night. He denies any constipation. No significant Neurological issues. He does have a pending knee replacement on his left knee for osteoarthritis.   PMH: Past Medical History:  Diagnosis Date  . Acquired hypothyroidism 11/16/2016  . Anterior myocardial infarction (HCC) 12/29/2013   Overview:  2004  . Benign essential HTN 12/29/2013  . Benign prostatic hyperplasia with urinary frequency 10/19/2016  . Borderline diabetes mellitus 11/16/2016  . Coronary artery disease 12/29/2013   Overview:  Anterior MI, PCI and stent placement of LAD11/04  . H/O iron deficiency anemia 09/09/2015  . Moderate tricuspid insufficiency 09/12/2014  . Primary osteoarthritis of left knee 08/26/2016  . Pure hypercholesterolemia 12/29/2013  . PVD  (peripheral vascular disease) (HCC) 12/29/2013   Overview:  With carotid atherosclerosis     Surgical History: Past Surgical History:  Procedure Laterality Date  . HERNIA REPAIR     x3    Home Medications:  Allergies as of 11/16/2016   No Known Allergies     Medication List       Accurate as of 11/16/16 12:05 PM. Always use your most recent med list.          amLODipine 5 MG tablet Commonly known as:  NORVASC Take 5 mg by mouth daily.   aspirin EC 81 MG tablet Take by mouth.   finasteride 5 MG tablet Commonly known as:  PROSCAR TAKE 1 TABLET (5 MG TOTAL) BY MOUTH ONCE DAILY.   levothyroxine 137 MCG tablet Commonly known as:  SYNTHROID, LEVOTHROID TAKE 1 TABLET BY MOUTH ONCE DAILY ON EMPTY STOMACH WITH WATER 30-60 MIN BEFORE BREAKFAST   lisinopril 40 MG tablet Commonly known as:  PRINIVIL,ZESTRIL Take 40 mg by mouth daily.   simvastatin 20 MG tablet Commonly known as:  ZOCOR Take by mouth.   tamsulosin 0.4 MG Caps capsule Commonly known as:  FLOMAX TAKE 1 CAPSULE (0.4 MG TOTAL) BY MOUTH ONCE DAILY. TAKE 30 MINUTES AFTER SAME MEAL EACH DAY.       Allergies: No Known Allergies  Family History: Family History  Problem Relation Age of Onset  . Kidney failure Mother   . Prostate cancer Neg Hx     Social History:  reports that he has never smoked. He has never used smokeless tobacco. He reports that he  does not drink alcohol or use drugs.  ROS: UROLOGY Frequent Urination?: Yes Hard to postpone urination?: No Burning/pain with urination?: No Get up at night to urinate?: Yes Leakage of urine?: No Urine stream starts and stops?: No Trouble starting stream?: No Do you have to strain to urinate?: Yes Blood in urine?: No Urinary tract infection?: No Sexually transmitted disease?: Yes Injury to kidneys or bladder?: No Painful intercourse?: No Weak stream?: No Erection problems?: No Penile pain?: No  Gastrointestinal Nausea?: No Vomiting?:  No Indigestion/heartburn?: No Diarrhea?: No Constipation?: No  Constitutional Fever: No Night sweats?: No Weight loss?: No Fatigue?: No  Skin Skin rash/lesions?: No Itching?: No  Eyes Blurred vision?: No Double vision?: No  Ears/Nose/Throat Sore throat?: No Sinus problems?: No  Hematologic/Lymphatic Swollen glands?: No Easy bruising?: Yes  Cardiovascular Leg swelling?: No Chest pain?: No  Respiratory Cough?: No Shortness of breath?: No  Endocrine Excessive thirst?: No  Musculoskeletal Back pain?: No Joint pain?: Yes  Neurological Headaches?: No Dizziness?: No  Psychologic Depression?: No Anxiety?: No  Physical Exam: BP (!) 160/63   Pulse 70   Ht 5\' 4"  (1.626 m)   Wt 68 kg (150 lb)   BMI 25.75 kg/m   Constitutional:  Alert and oriented, No acute distress. HEENT: [place] AT, moist mucus membranes.  Trachea midline, no masses. Cardiovascular: No clubbing, cyanosis, or edema. Respiratory: Normal respiratory effort, no increased work of breathing. GI: Abdomen is soft, nontender, nondistended, no abdominal masses GU: No CVA tenderness.  DRE: Small gland, symmetric Skin: No rashes, bruises or suspicious lesions. Lymph: No cervical or inguinal adenopathy. Neurologic: Grossly intact, no focal deficits, moving all 4 extremities. Psychiatric: Normal mood and affect.  Laboratory Data: Lab Results  Component Value Date   HGB 13.2 03/16/2012    No results found for: CREATININE  No results found for: PSA  No results found for: TESTOSTERONE  No results found for: HGBA1C  Urinalysis No results found for: COLORURINE, APPEARANCEUR, LABSPEC, PHURINE, GLUCOSEU, HGBUR, BILIRUBINUR, KETONESUR, PROTEINUR, UROBILINOGEN, NITRITE, LEUKOCYTESUR  Pertinent Imaging: PVR obtained in the supine position using ultrasound.  PVR: 5mL  Assessment & Plan:  The patient has significant nocturia and maybe nocturnal polyuria based on his history.  HE also has  frequency/urgency during the day.  He does not appear to have benefited from the tamsulosin that he was started on, and as such I recommended that he stop taking this today. I then have given him a voiding diary which he will start in 1 week for 3 days. We will then have him return to clinic at the end of next week to go over the voiding diary and his symptoms off the tamsulosin. At that appointment we will also perform cystoscopy to evaluate for bladder outlet obstruction.  He may benefit from either some form of DDAVP or even overactive bladder medication like myrbetriq depending on the results of his diary as well as cystoscopy. He may also be a good candidate for bladder outlet procedure including TURP or Urolift. The patient is eager to start on some form of treatment that'll help so that he can move onto his knee replacement surgery.  1. Urge incontinence of urine  - Urinalysis, Complete - BLADDER SCAN AMB NON-IMAGING   No Follow-up on file.  Crist Fat, MD  [facility] [address], [address] [place], [place] [address] [phone]

## 2016-11-17 LAB — URINALYSIS, COMPLETE
Bilirubin, UA: NEGATIVE
GLUCOSE, UA: NEGATIVE
KETONES UA: NEGATIVE
NITRITE UA: NEGATIVE
Protein, UA: NEGATIVE
RBC, UA: NEGATIVE
SPEC GRAV UA: 1.01 (ref 1.005–1.030)
Urobilinogen, Ur: 1 mg/dL (ref 0.2–1.0)
pH, UA: 6 (ref 5.0–7.5)

## 2016-11-17 LAB — MICROSCOPIC EXAMINATION
Bacteria, UA: NONE SEEN
RBC, UA: NONE SEEN /hpf (ref 0–?)

## 2016-11-26 ENCOUNTER — Ambulatory Visit: Payer: Medicare Other | Admitting: Urology

## 2016-11-26 VITALS — BP 115/60 | HR 67 | Ht 64.0 in | Wt 150.0 lb

## 2016-11-26 DIAGNOSIS — N3281 Overactive bladder: Secondary | ICD-10-CM

## 2016-11-26 DIAGNOSIS — R351 Nocturia: Secondary | ICD-10-CM

## 2016-11-26 LAB — MICROSCOPIC EXAMINATION

## 2016-11-26 LAB — URINALYSIS, COMPLETE
Bilirubin, UA: NEGATIVE
Glucose, UA: NEGATIVE
KETONES UA: NEGATIVE
NITRITE UA: NEGATIVE
PH UA: 5.5 (ref 5.0–7.5)
Protein, UA: NEGATIVE
Specific Gravity, UA: 1.01 (ref 1.005–1.030)
Urobilinogen, Ur: 1 mg/dL (ref 0.2–1.0)

## 2016-11-26 MED ORDER — MIRABEGRON ER 25 MG PO TB24: 25.0000 mg | ORAL_TABLET | Freq: Every day | ORAL | 11 refills | Status: DC

## 2016-11-26 MED ORDER — CIPROFLOXACIN HCL 500 MG PO TABS
500.0000 mg | ORAL_TABLET | Freq: Once | ORAL | Status: AC
  Administered 2016-11-26: 500 mg via ORAL

## 2016-11-26 MED ORDER — LIDOCAINE HCL 2 % EX GEL
1.0000 "application " | Freq: Once | CUTANEOUS | Status: AC
  Administered 2016-11-26: 1 via URETHRAL

## 2016-11-26 NOTE — Progress Notes (Signed)
   11/26/16  CC:  Chief Complaint  Patient presents with  . Cysto    HPI: The patient is 83 year old male who presents for cystoscpy for further evaluation and management of urinary frequency and urgency. His most bothersome complaint is nocturia. The patient has a history of BPH and instructed voiding symptoms. He is been seen by Dr. Lonna CobbStoioff at least 5 years ago. He takes both Flomax and finasteride for his voiding symptoms. He is only recently been on the Flomax, he feels that this is made his symptoms worse. Therefore this was stopped at his last appointment. The patient states that he at times has a difficult time getting his stream started, does not feel like he empties his bladder completely, and at night has severe nocturia. At times, he gets up every 30 minutes to void. He is losing significant amounts of sleep. The patient states that he feels like he makes more urine at night and uses a urinal at the bedside at night which is often full. The patient denies any lower extremity swelling. He also denies obstructive sleep apnea or significant snoring at night. He denies any constipation. No significant Neurological issues. He does have a pending knee replacement on his left knee for osteoarthritis. PVR: 5 cc  Voiding diary showed voids between 2 and 4 ounces every 3-4 hours during the day. He did tend to void 10-15 ounces once or twice at night as well as once in the morning. He does have some component of nocturnal polyuria.  He presents today for cystoscopy.   There were no vitals taken for this visit. NED. A&Ox3.   No respiratory distress   Abd soft, NT, ND Normal phallus with bilateral descended testicles  Cystoscopy Procedure Note  Patient identification was confirmed, informed consent was obtained, and patient was prepped using Betadine solution.  Lidocaine jelly was administered per urethral meatus.    Preoperative abx where received prior to procedure.     Pre-Procedure: -  Inspection reveals a normal caliber ureteral meatus.  Procedure: The flexible cystoscope was introduced without difficulty - No urethral strictures/lesions are present. - Enlarged prostate Only 2-3 cm in length. Minimally obstructive. - Normal bladder neck - Bilateral ureteral orifices identified - Bladder mucosa  reveals no ulcers, tumors, or lesions - No bladder stones - No trabeculation  Retroflexion shows no intravesical lobe   Post-Procedure: - Patient tolerated the procedure well  Assessment/ Plan:  1. BPH 2. OAB 3. Nocturia I discussed the patient after reviewing his cystoscopy and voiding diary that is symptoms are likely combination of both an overactive bladder and nocturia. For now I recommend starting a medication for overactive bladder. We will start him Mybetriq 25 mg daily. I would like to avoid anticholinergics in the patient due to his age. He does have some component of nocturnal polyuria, so if he does not respond to the Mybetriq we may consider DDAVP in the future. We have to being currently cautious with this medication as he does have a history of hyponatremia. He will also continue the finasteride at this time.

## 2016-12-24 ENCOUNTER — Encounter
Admission: RE | Admit: 2016-12-24 | Discharge: 2016-12-24 | Disposition: A | Discharge status: Home / Self Care | Payer: Medicare Other | Source: Ambulatory Visit | Attending: Surgery | Admitting: Surgery

## 2016-12-24 ENCOUNTER — Ambulatory Visit
Admission: RE | Admit: 2016-12-24 | Discharge: 2016-12-24 | Disposition: A | Discharge status: Home / Self Care | Payer: Medicare Other | Source: Ambulatory Visit | Attending: Surgery | Admitting: Surgery

## 2016-12-24 DIAGNOSIS — Z01818 Encounter for other preprocedural examination: Secondary | ICD-10-CM | POA: Insufficient documentation

## 2016-12-24 DIAGNOSIS — I10 Essential (primary) hypertension: Secondary | ICD-10-CM

## 2016-12-24 DIAGNOSIS — M1712 Unilateral primary osteoarthritis, left knee: Secondary | ICD-10-CM | POA: Insufficient documentation

## 2016-12-24 HISTORY — DX: Personal history of other diseases of male genital organs: Z87.438

## 2016-12-24 HISTORY — DX: Tremor, unspecified: R25.1

## 2016-12-24 HISTORY — DX: Prediabetes: R73.03

## 2016-12-24 LAB — URINALYSIS, COMPLETE (UACMP) WITH MICROSCOPIC
BILIRUBIN URINE: NEGATIVE
Bacteria, UA: NONE SEEN
GLUCOSE, UA: NEGATIVE mg/dL
Ketones, ur: NEGATIVE mg/dL
Leukocytes, UA: NEGATIVE
NITRITE: NEGATIVE
PH: 5 (ref 5.0–8.0)
Protein, ur: NEGATIVE mg/dL
RBC / HPF: NONE SEEN RBC/hpf (ref 0–5)
SPECIFIC GRAVITY, URINE: 1.01 (ref 1.005–1.030)

## 2016-12-24 LAB — TYPE AND SCREEN
ABO/RH(D): A POS
Antibody Screen: NEGATIVE

## 2016-12-24 LAB — SURGICAL PCR SCREEN
MRSA, PCR: NEGATIVE
Staphylococcus aureus: NEGATIVE

## 2016-12-24 LAB — CBC
HCT: 38.8 % — ABNORMAL LOW (ref 40.0–52.0)
Hemoglobin: 13.5 g/dL (ref 13.0–18.0)
MCH: 33.8 pg (ref 26.0–34.0)
MCHC: 34.8 g/dL (ref 32.0–36.0)
MCV: 97.4 fL (ref 80.0–100.0)
PLATELETS: 222 10*3/uL (ref 150–440)
RBC: 3.99 MIL/uL — ABNORMAL LOW (ref 4.40–5.90)
RDW: 12.7 % (ref 11.5–14.5)
WBC: 6 10*3/uL (ref 3.8–10.6)

## 2016-12-24 LAB — BASIC METABOLIC PANEL
ANION GAP: 7 (ref 5–15)
BUN: 25 mg/dL — ABNORMAL HIGH (ref 6–20)
CO2: 26 mmol/L (ref 22–32)
Calcium: 9.1 mg/dL (ref 8.9–10.3)
Chloride: 104 mmol/L (ref 101–111)
Creatinine, Ser: 0.98 mg/dL (ref 0.61–1.24)
GLUCOSE: 105 mg/dL — AB (ref 65–99)
Potassium: 4.2 mmol/L (ref 3.5–5.1)
Sodium: 137 mmol/L (ref 135–145)

## 2016-12-24 LAB — PROTIME-INR
INR: 1.09
Prothrombin Time: 14.1 seconds (ref 11.4–15.2)

## 2016-12-24 IMAGING — CR DG CHEST 2V
1 series · 2 of 2 positions shown · non-contrast
Comparison: None.

CLINICAL DATA: Preadmission evaluation for upcoming knee
replacement, initial encounter

EXAM:
CHEST  2 VIEW

[Series 1: dg chest 2 view · 0.14mm/px · 2 of 2 slices shown]
[im 1/2]
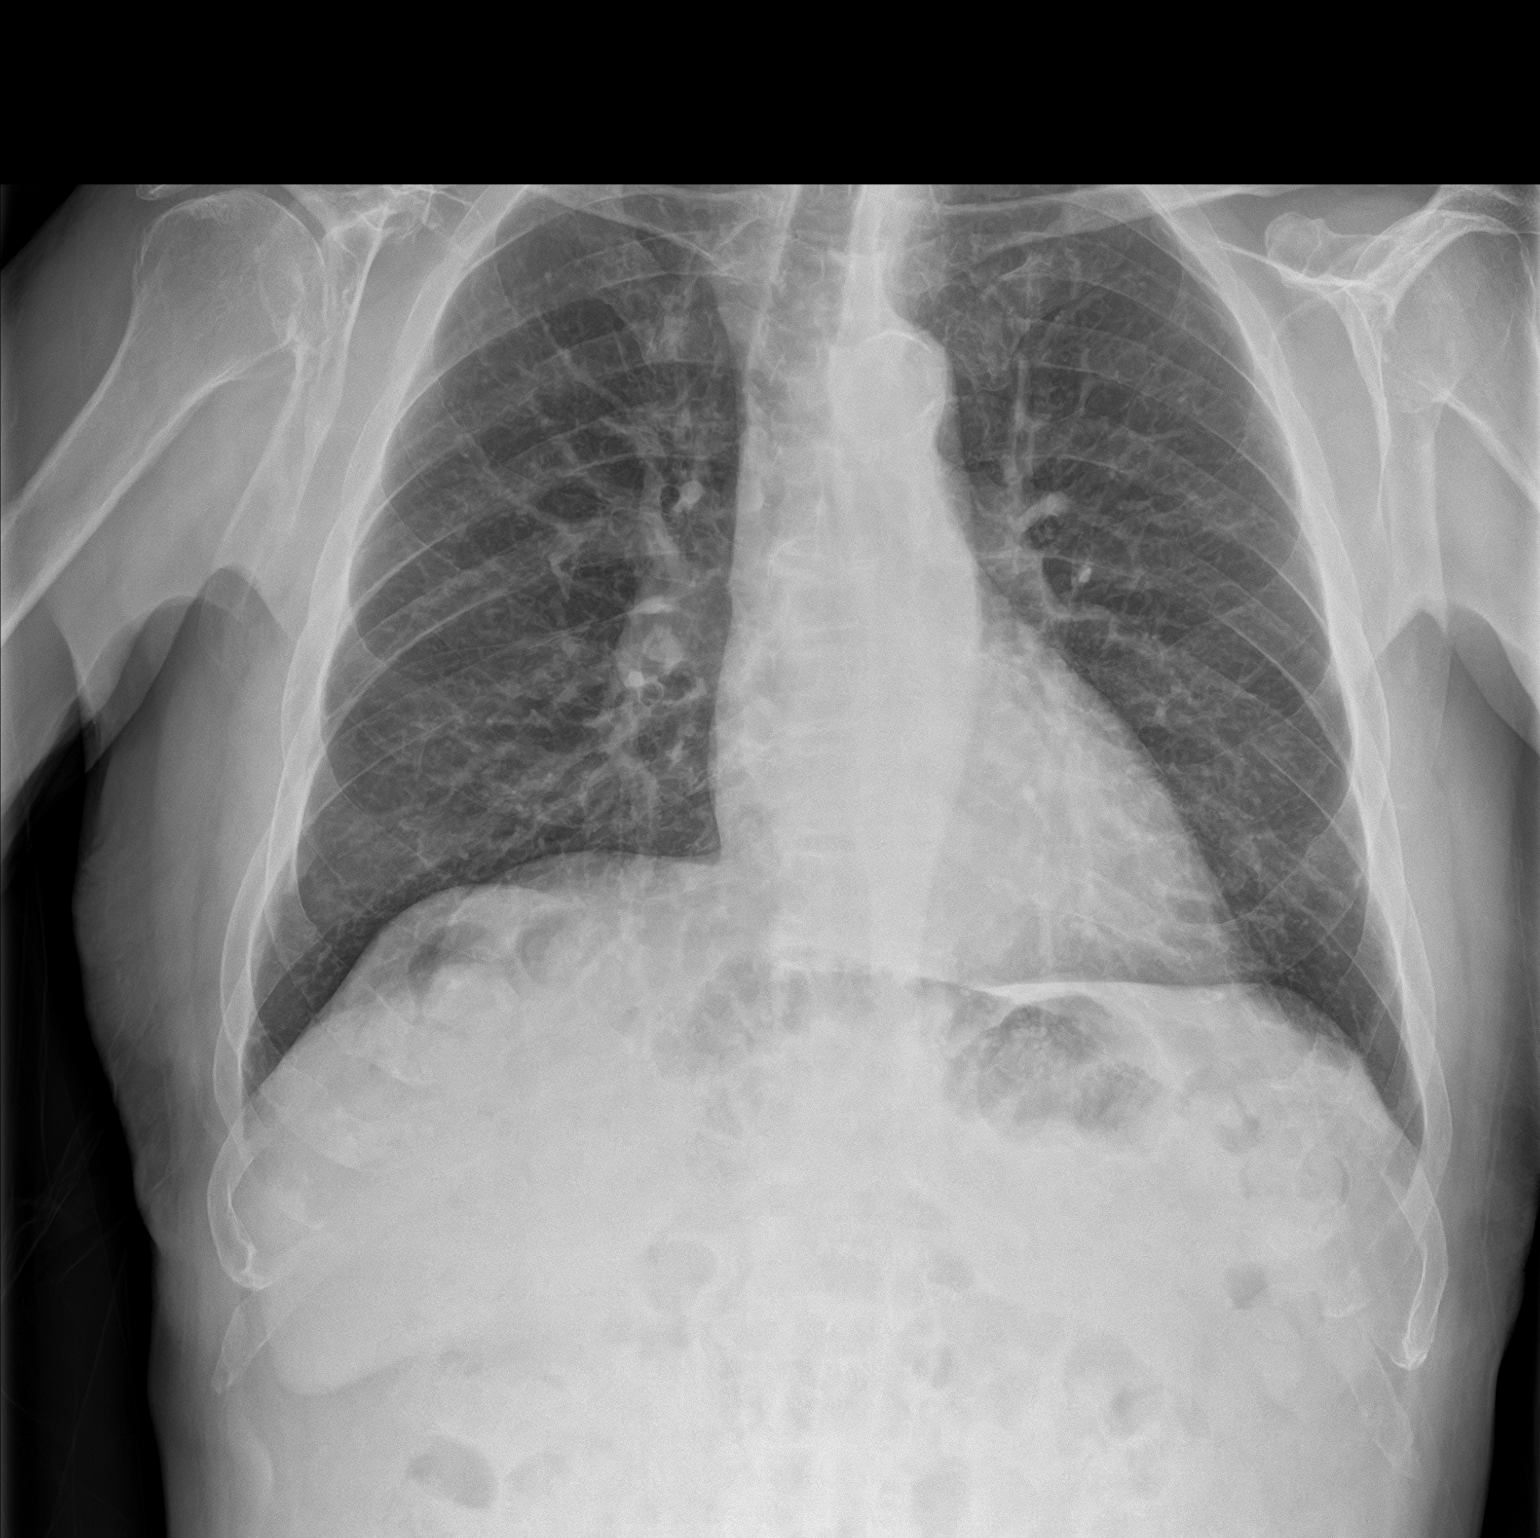
[im 2/2]
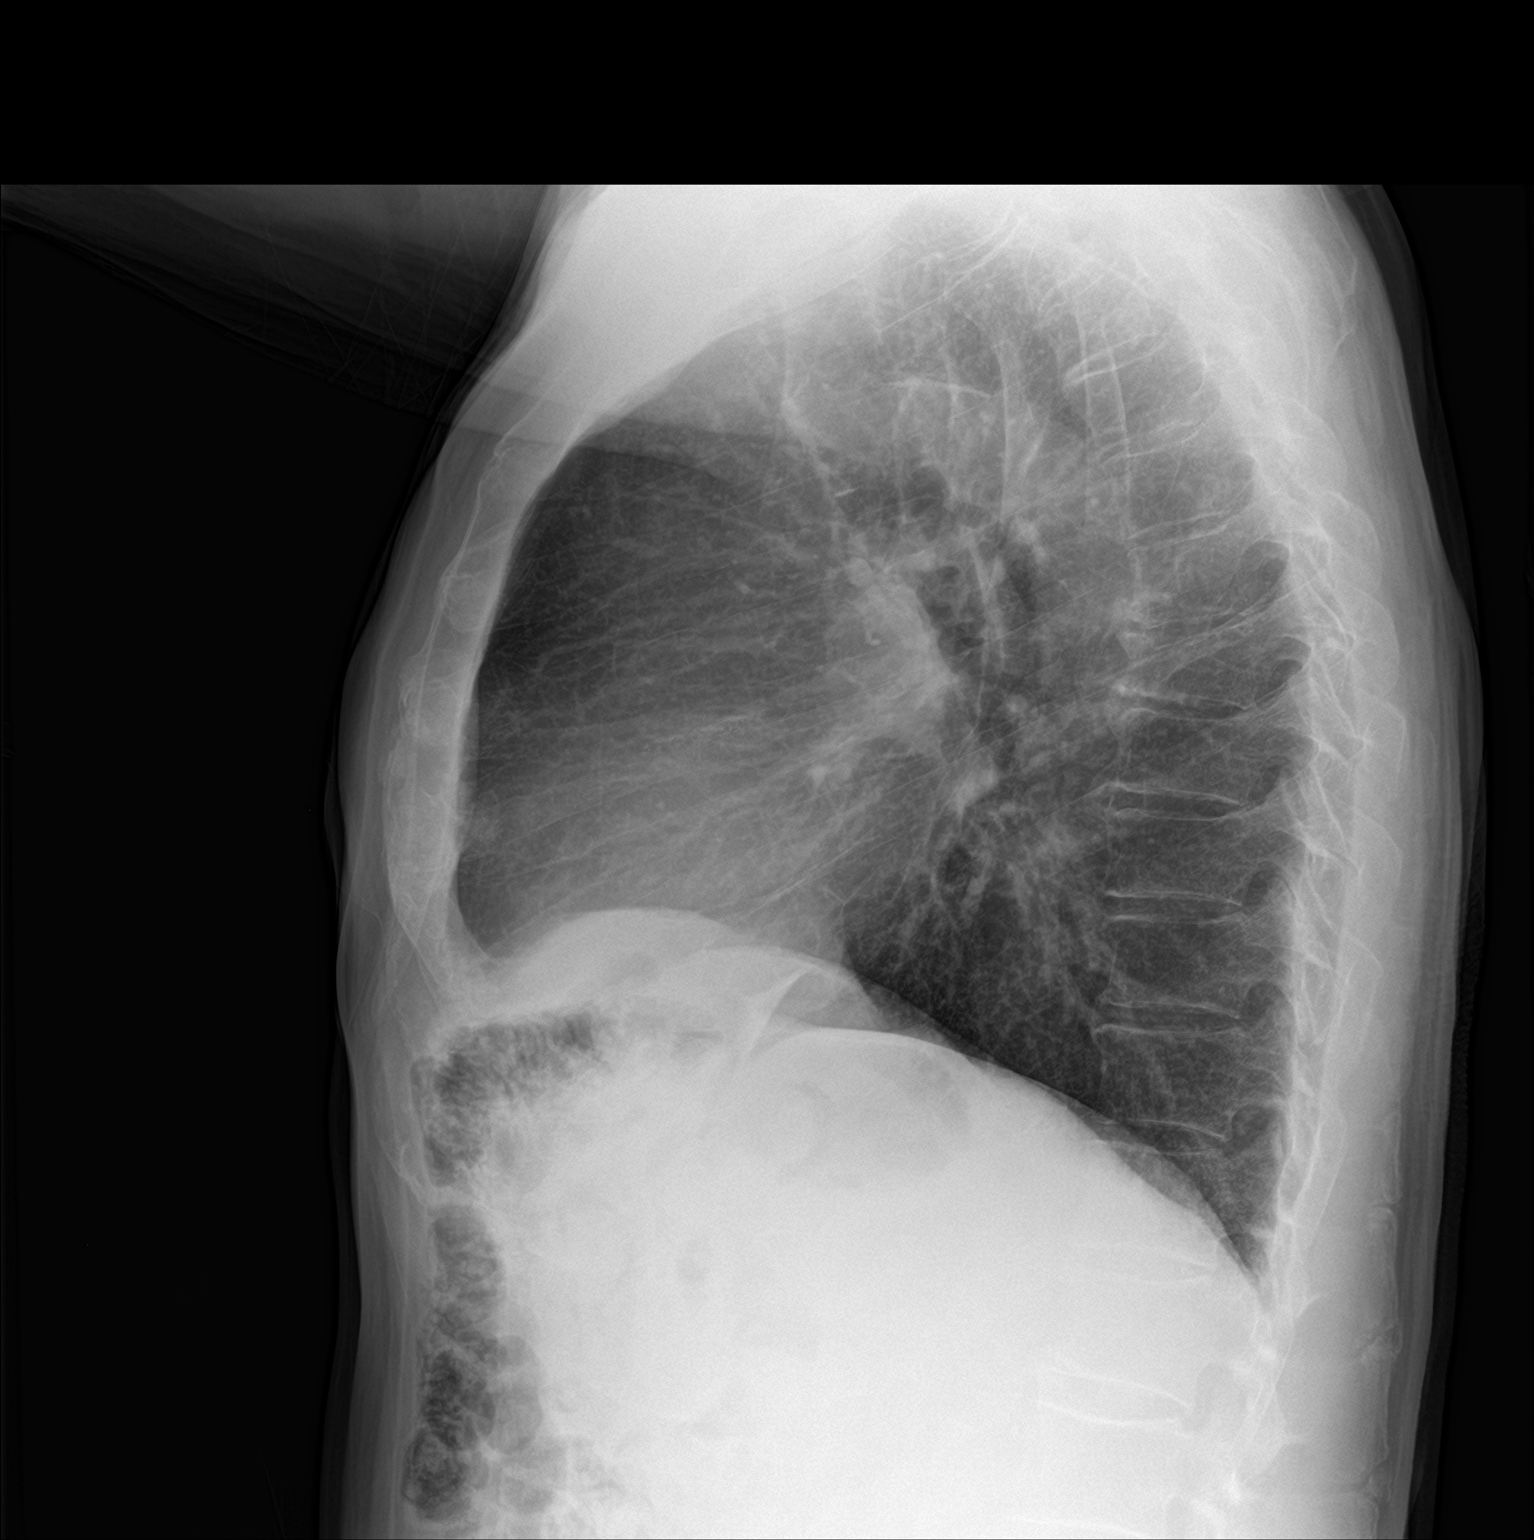

[2 of 2 positions shown; findings below may reference images not displayed]

FINDINGS: The heart size and mediastinal contours are within normal limits.
Both lungs are clear. The visualized skeletal structures are
unremarkable.
IMPRESSION: No active cardiopulmonary disease.

## 2016-12-24 NOTE — Patient Instructions (Signed)
  Your procedure is scheduled on: December 31, 2016 [facility] ) Report to Same Day Surgery 2nd floor medical mall (Medical Mall Entrance-take elevator on left to 2nd floor.  Check in with surgery information desk.) To find out your arrival time please call [phone] between 1PM - 3PM on December 30, 2016 [facility] ) Remember: Instructions that are not followed completely may result in serious medical risk, up to and including death, or upon the discretion of your surgeon and anesthesiologist your surgery may need to be rescheduled.    _x___ 1. Do not eat food or drink liquids after midnight. No gum chewing or hard candies                                  __x__ 2. No Alcohol for 24 hours before or after surgery.   __x__3. No Smoking for 24 prior to surgery.   ____  4. Bring all medications with you on the day of surgery if instructed.    __x__ 5. Notify your doctor if there is any change in your medical condition     (cold, fever, infections).     Do not wear jewelry, make-up, hairpins, clips or nail polish.  Do not wear lotions, powders, or perfumes.   Do not shave 48 hours prior to surgery. Men may shave face and neck.  Do not bring valuables to the hospital.    [facility] is not responsible for any belongings or valuables.               Contacts, dentures or bridgework may not be worn into surgery.  Leave your suitcase in the car. After surgery it may be brought to your room.  For patients admitted to the hospital, discharge time is determined by your  treatment team                    Patients discharged the day of surgery will not be allowed to drive home.  You will need someone to drive you home and stay with you the night of your procedure.    Please read over the following fact sheets that you were given:   [facility] Preparing for Surgery and or MRSA Information   TAKE THE FOLLOWING MEDICATIONS THE MORNING OF SURGERY WITH A SIP OF WATER :  1. AMLODIPINE   2.  LISINOPRIL  3.LEVOTHYROXINE  4.  5.  6.  ____Fleets enema or Magnesium Citrate as directed.   _x___ Use CHG Soap or sage wipes as directed on instruction sheet   ____ Use inhalers on the day of surgery and bring to hospital day of surgery  ____ Stop Metformin and Janumet 2 days prior to surgery.    ____ Take 1/2 of usual insulin dose the night before surgery and none on the morning surgery  .   _x___ Follow recommendations from Cardiologist, Pulmonologist or PCP regarding          stopping Aspirin, Coumadin, Plavix ,Eliquis, Effient, or Pradaxa, and Pletal. (ASK DR. POGGI ABOUT STOPPING ASPIRIN )  X____Stop Anti-inflammatories such as Advil, Aleve, Ibuprofen, Motrin, Naproxen, Naprosyn, Goodies powders or aspirin products. OK to take Tylenol    _x___ Stop supplements until after surgery.  But may continue Vitamin D, Vitamin B, and multivitamin (STOP OSTEO-BI-FLEX AND FISH OIL NOW )    .   ____ Bring C-Pap to the hospital.

## 2016-12-24 NOTE — Pre-Procedure Instructions (Signed)
PATIENT STATED DR POGGI TOLD HIM TO LET CARDIOLOGY KNOW HE IS HAVING SURGERY. SENT REQUEST WITH PREOP EKG TO DR Gwen Pounds AND SPOKE WITH PATRICIA

## 2016-12-28 NOTE — Pre-Procedure Instructions (Signed)
Cardiac clearance received from Dr. Gwen Pounds, pt is low risk for surgery.  See clearance in front of pt's chart.

## 2016-12-31 ENCOUNTER — Encounter: Payer: Self-pay | Admitting: *Deleted

## 2016-12-31 ENCOUNTER — Inpatient Hospital Stay: Payer: Medicare Other

## 2016-12-31 ENCOUNTER — Encounter
Admission: RE | Disposition: A | Discharge status: Home / Self Care | Payer: Self-pay | Source: Ambulatory Visit | Attending: Surgery

## 2016-12-31 ENCOUNTER — Inpatient Hospital Stay: Payer: Medicare Other | Admitting: Registered Nurse

## 2016-12-31 ENCOUNTER — Observation Stay
Admission: RE | Admit: 2016-12-31 | Discharge: 2017-01-01 | Disposition: A | Discharge status: Home / Self Care | Payer: Medicare Other | Source: Ambulatory Visit | Attending: Surgery | Admitting: Surgery

## 2016-12-31 DIAGNOSIS — Z8042 Family history of malignant neoplasm of prostate: Secondary | ICD-10-CM | POA: Insufficient documentation

## 2016-12-31 DIAGNOSIS — I7090 Unspecified atherosclerosis: Secondary | ICD-10-CM | POA: Insufficient documentation

## 2016-12-31 DIAGNOSIS — E039 Hypothyroidism, unspecified: Secondary | ICD-10-CM | POA: Diagnosis not present

## 2016-12-31 DIAGNOSIS — E119 Type 2 diabetes mellitus without complications: Secondary | ICD-10-CM | POA: Diagnosis not present

## 2016-12-31 DIAGNOSIS — M1712 Unilateral primary osteoarthritis, left knee: Principal | ICD-10-CM | POA: Insufficient documentation

## 2016-12-31 DIAGNOSIS — M1990 Unspecified osteoarthritis, unspecified site: Secondary | ICD-10-CM | POA: Diagnosis present

## 2016-12-31 DIAGNOSIS — N400 Enlarged prostate without lower urinary tract symptoms: Secondary | ICD-10-CM | POA: Insufficient documentation

## 2016-12-31 DIAGNOSIS — Z955 Presence of coronary angioplasty implant and graft: Secondary | ICD-10-CM | POA: Diagnosis not present

## 2016-12-31 DIAGNOSIS — Z803 Family history of malignant neoplasm of breast: Secondary | ICD-10-CM | POA: Diagnosis not present

## 2016-12-31 DIAGNOSIS — Z7982 Long term (current) use of aspirin: Secondary | ICD-10-CM | POA: Diagnosis not present

## 2016-12-31 DIAGNOSIS — D509 Iron deficiency anemia, unspecified: Secondary | ICD-10-CM | POA: Diagnosis not present

## 2016-12-31 DIAGNOSIS — E7800 Pure hypercholesterolemia, unspecified: Secondary | ICD-10-CM | POA: Insufficient documentation

## 2016-12-31 DIAGNOSIS — Z9841 Cataract extraction status, right eye: Secondary | ICD-10-CM | POA: Insufficient documentation

## 2016-12-31 DIAGNOSIS — Z79899 Other long term (current) drug therapy: Secondary | ICD-10-CM | POA: Diagnosis not present

## 2016-12-31 DIAGNOSIS — Z96652 Presence of left artificial knee joint: Secondary | ICD-10-CM

## 2016-12-31 DIAGNOSIS — Z8249 Family history of ischemic heart disease and other diseases of the circulatory system: Secondary | ICD-10-CM | POA: Insufficient documentation

## 2016-12-31 DIAGNOSIS — I739 Peripheral vascular disease, unspecified: Secondary | ICD-10-CM | POA: Insufficient documentation

## 2016-12-31 DIAGNOSIS — I2510 Atherosclerotic heart disease of native coronary artery without angina pectoris: Secondary | ICD-10-CM | POA: Insufficient documentation

## 2016-12-31 DIAGNOSIS — I10 Essential (primary) hypertension: Secondary | ICD-10-CM | POA: Insufficient documentation

## 2016-12-31 DIAGNOSIS — I252 Old myocardial infarction: Secondary | ICD-10-CM | POA: Diagnosis not present

## 2016-12-31 DIAGNOSIS — I340 Nonrheumatic mitral (valve) insufficiency: Secondary | ICD-10-CM | POA: Diagnosis not present

## 2016-12-31 HISTORY — PX: PARTIAL KNEE ARTHROPLASTY: SHX2174

## 2016-12-31 LAB — ABO/RH: ABO/RH(D): A POS

## 2016-12-31 LAB — GLUCOSE, CAPILLARY
Glucose-Capillary: 116 mg/dL — ABNORMAL HIGH (ref 65–99)
Glucose-Capillary: 108 mg/dL — ABNORMAL HIGH (ref 65–99)

## 2016-12-31 IMAGING — DX DG KNEE 1-2V PORT*L*
2 series · 2 of 2 positions shown · non-contrast
Comparison: None.

CLINICAL DATA: Postop day lateral unicompartmental left knee
arthroplasty.

EXAM:
PORTABLE LEFT KNEE - 1-2 VIEW

[knee ap]
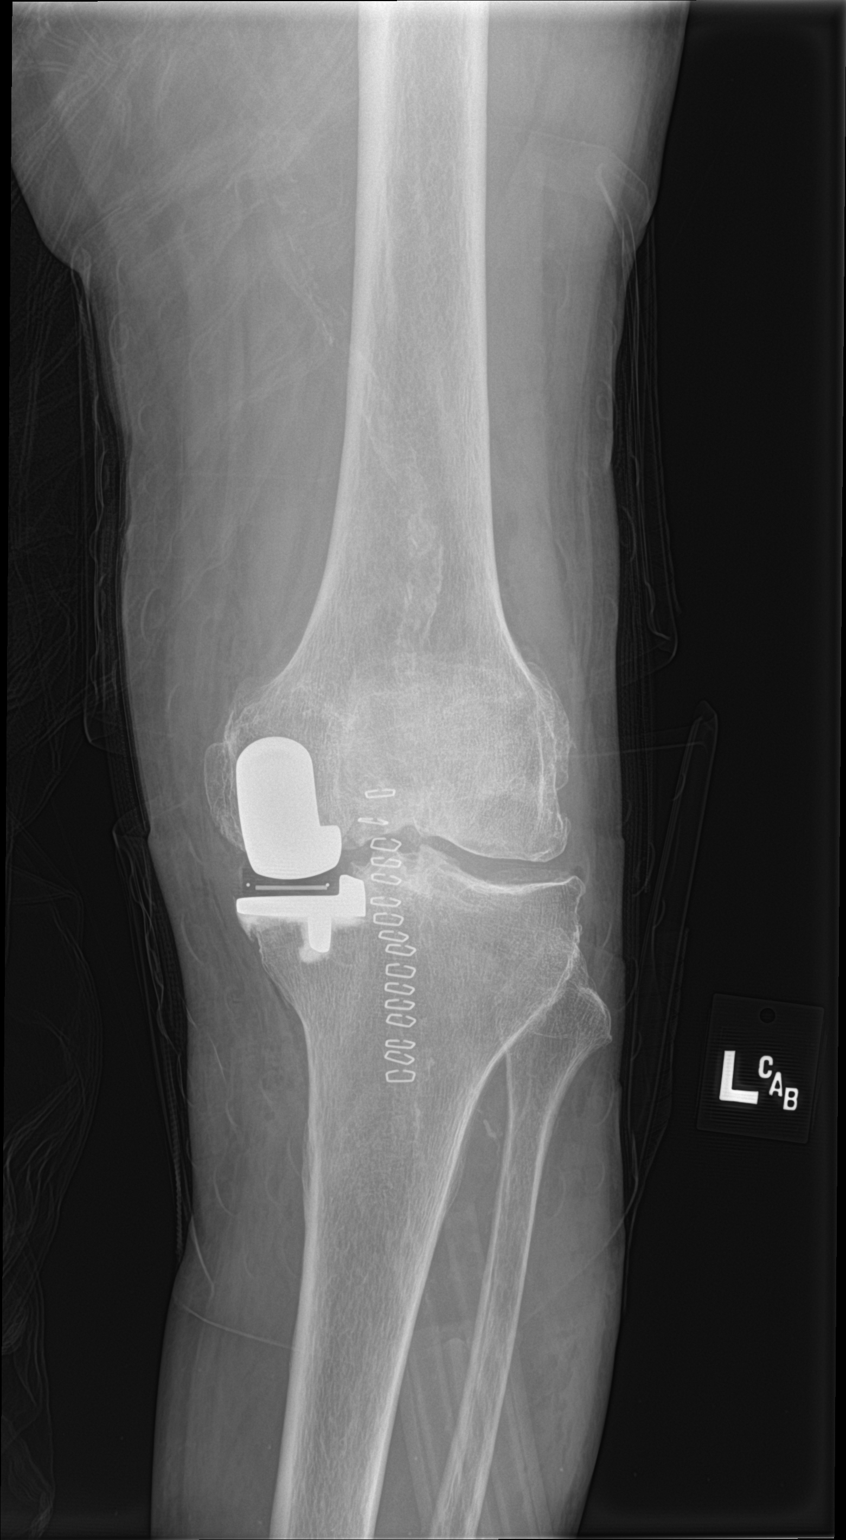

[knee lat]
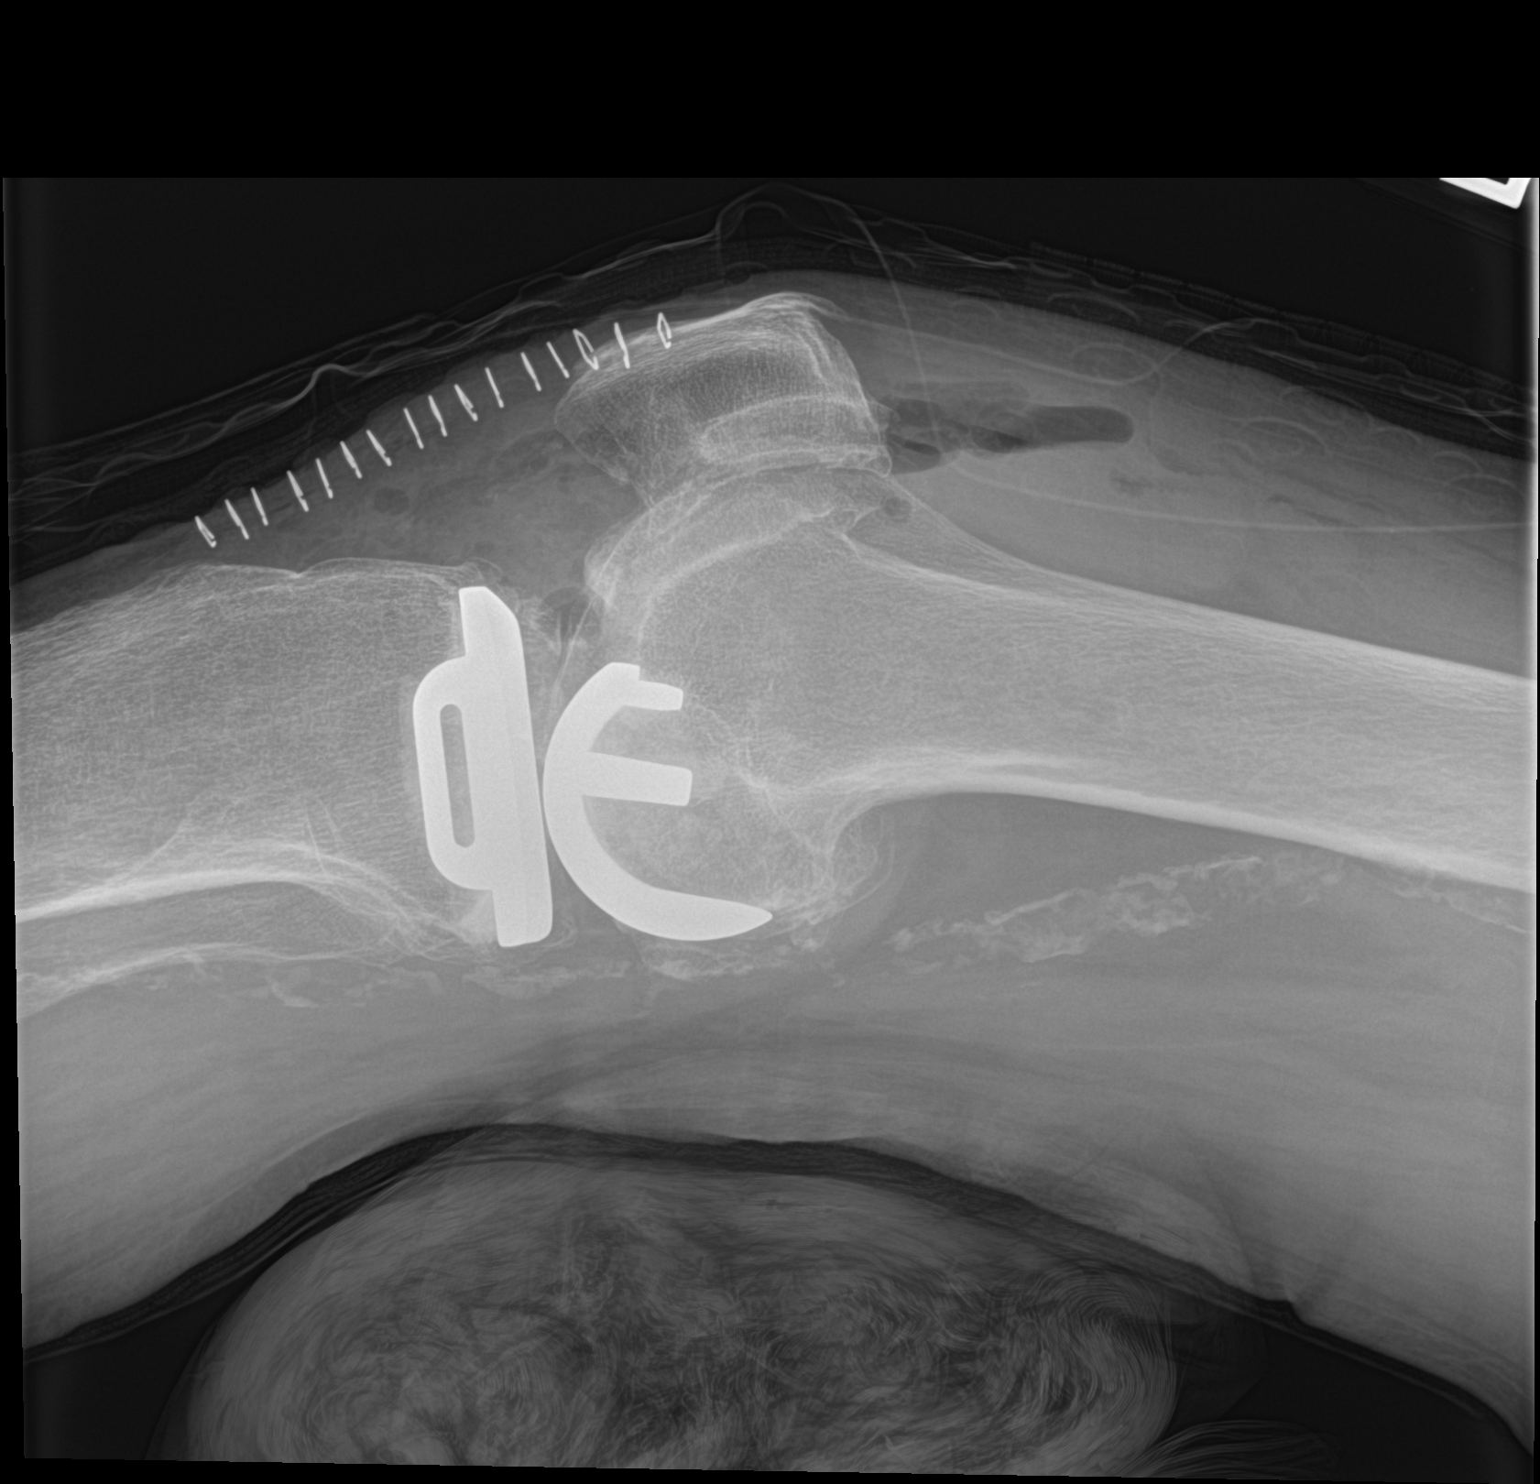

[2 of 2 positions shown; findings below may reference images not displayed]

FINDINGS: Anatomic alignment post lateral unicompartmental left knee
arthroplasty. No complicating features. Well-preserved lateral
compartment joint space. Marked patellofemoral compartment joint
space narrowing. Extensive femoropopliteal and tibioperoneal artery
atherosclerosis.
IMPRESSION: Anatomic alignment post lateral unicompartmental left knee
arthroplasty without acute complicating features.

## 2016-12-31 SURGERY — ARTHROPLASTY, KNEE, UNICOMPARTMENTAL
Anesthesia: Spinal | Laterality: Left | Wound class: Clean

## 2016-12-31 MED ORDER — FENTANYL CITRATE (PF) 100 MCG/2ML IJ SOLN
INTRAMUSCULAR | Status: AC
  Filled 2016-12-31: qty 2

## 2016-12-31 MED ORDER — METOCLOPRAMIDE HCL 10 MG PO TABS: 5.0000 mg | ORAL_TABLET | Freq: Three times a day (TID) | ORAL | Status: DC | PRN

## 2016-12-31 MED ORDER — GLYCOPYRROLATE 0.2 MG/ML IJ SOLN
INTRAMUSCULAR | Status: DC | PRN
  Administered 2016-12-31: 0.2 mg via INTRAVENOUS

## 2016-12-31 MED ORDER — CEFAZOLIN SODIUM-DEXTROSE 2-4 GM/100ML-% IV SOLN
2.0000 g | Freq: Four times a day (QID) | INTRAVENOUS | Status: AC
  Administered 2016-12-31 – 2017-01-01 (×3): 2 g via INTRAVENOUS
  Filled 2016-12-31 (×4): qty 100

## 2016-12-31 MED ORDER — SODIUM CHLORIDE 0.9 % IV SOLN
INTRAVENOUS | Status: DC
  Administered 2016-12-31: 10:00:00 via INTRAVENOUS

## 2016-12-31 MED ORDER — KCL IN DEXTROSE-NACL 20-5-0.9 MEQ/L-%-% IV SOLN
INTRAVENOUS | Status: DC
  Administered 2016-12-31 – 2017-01-01 (×2): via INTRAVENOUS
  Filled 2016-12-31 (×3): qty 1000

## 2016-12-31 MED ORDER — ONDANSETRON HCL 4 MG/2ML IJ SOLN: 4.0000 mg | Freq: Four times a day (QID) | INTRAMUSCULAR | Status: DC | PRN

## 2016-12-31 MED ORDER — LIDOCAINE HCL (PF) 2 % IJ SOLN
INTRAMUSCULAR | Status: AC
  Filled 2016-12-31: qty 2

## 2016-12-31 MED ORDER — DIPHENHYDRAMINE HCL 12.5 MG/5ML PO ELIX: 12.5000 mg | ORAL_SOLUTION | ORAL | Status: DC | PRN

## 2016-12-31 MED ORDER — DOCUSATE SODIUM 100 MG PO CAPS
100.0000 mg | ORAL_CAPSULE | Freq: Two times a day (BID) | ORAL | Status: DC
  Administered 2016-12-31 – 2017-01-01 (×3): 100 mg via ORAL
  Filled 2016-12-31 (×4): qty 1

## 2016-12-31 MED ORDER — OXYCODONE HCL 5 MG PO TABS
5.0000 mg | ORAL_TABLET | ORAL | Status: DC | PRN
  Administered 2016-12-31: 5 mg via ORAL
  Administered 2016-12-31 – 2017-01-01 (×3): 10 mg via ORAL
  Filled 2016-12-31 (×3): qty 2
  Filled 2016-12-31: qty 1

## 2016-12-31 MED ORDER — ACETAMINOPHEN 500 MG PO TABS
1000.0000 mg | ORAL_TABLET | Freq: Four times a day (QID) | ORAL | Status: DC
  Administered 2016-12-31 – 2017-01-01 (×3): 1000 mg via ORAL
  Filled 2016-12-31 (×3): qty 2

## 2016-12-31 MED ORDER — ENOXAPARIN SODIUM 40 MG/0.4ML ~~LOC~~ SOLN
40.0000 mg | SUBCUTANEOUS | Status: DC
  Administered 2017-01-01: 40 mg via SUBCUTANEOUS
  Filled 2016-12-31: qty 0.4

## 2016-12-31 MED ORDER — FENTANYL CITRATE (PF) 100 MCG/2ML IJ SOLN: 25.0000 ug | INTRAMUSCULAR | Status: DC | PRN

## 2016-12-31 MED ORDER — METOCLOPRAMIDE HCL 5 MG/ML IJ SOLN: 5.0000 mg | Freq: Three times a day (TID) | INTRAMUSCULAR | Status: DC | PRN

## 2016-12-31 MED ORDER — MIDAZOLAM HCL 5 MG/5ML IJ SOLN
INTRAMUSCULAR | Status: DC | PRN
  Administered 2016-12-31: 1 mg via INTRAVENOUS

## 2016-12-31 MED ORDER — SIMVASTATIN 20 MG PO TABS
20.0000 mg | ORAL_TABLET | Freq: Every day | ORAL | Status: DC
  Administered 2016-12-31: 20 mg via ORAL

## 2016-12-31 MED ORDER — FAMOTIDINE 20 MG PO TABS
20.0000 mg | ORAL_TABLET | Freq: Once | ORAL | Status: AC
  Administered 2016-12-31: 20 mg via ORAL

## 2016-12-31 MED ORDER — MAGNESIUM HYDROXIDE 400 MG/5ML PO SUSP
30.0000 mL | Freq: Every day | ORAL | Status: DC | PRN
  Administered 2017-01-01: 30 mL via ORAL
  Filled 2016-12-31: qty 30

## 2016-12-31 MED ORDER — MIRABEGRON ER 25 MG PO TB24
25.0000 mg | ORAL_TABLET | Freq: Every day | ORAL | Status: DC
  Administered 2016-12-31 – 2017-01-01 (×2): 25 mg via ORAL
  Filled 2016-12-31 (×2): qty 1

## 2016-12-31 MED ORDER — PROPOFOL 500 MG/50ML IV EMUL
INTRAVENOUS | Status: AC
  Filled 2016-12-31: qty 50

## 2016-12-31 MED ORDER — OMEGA-3-ACID ETHYL ESTERS 1 G PO CAPS
1.0000 g | ORAL_CAPSULE | Freq: Every day | ORAL | Status: DC
  Administered 2016-12-31 – 2017-01-01 (×2): 1 g via ORAL
  Filled 2016-12-31 (×2): qty 1

## 2016-12-31 MED ORDER — ONDANSETRON HCL 4 MG/2ML IJ SOLN
INTRAMUSCULAR | Status: AC
  Filled 2016-12-31: qty 2

## 2016-12-31 MED ORDER — PANTOPRAZOLE SODIUM 40 MG PO TBEC
40.0000 mg | DELAYED_RELEASE_TABLET | Freq: Every day | ORAL | Status: DC
  Administered 2016-12-31 – 2017-01-01 (×2): 40 mg via ORAL
  Filled 2016-12-31 (×3): qty 1

## 2016-12-31 MED ORDER — BUPIVACAINE LIPOSOME 1.3 % IJ SUSP
INTRAMUSCULAR | Status: AC
  Filled 2016-12-31: qty 20

## 2016-12-31 MED ORDER — LISINOPRIL 20 MG PO TABS
40.0000 mg | ORAL_TABLET | Freq: Every day | ORAL | Status: DC
  Administered 2017-01-01: 40 mg via ORAL
  Filled 2016-12-31: qty 1
  Filled 2016-12-31 (×3): qty 2

## 2016-12-31 MED ORDER — BISACODYL 10 MG RE SUPP: 10.0000 mg | Freq: Every day | RECTAL | Status: DC | PRN

## 2016-12-31 MED ORDER — ASPIRIN EC 81 MG PO TBEC
81.0000 mg | DELAYED_RELEASE_TABLET | Freq: Every day | ORAL | Status: DC
  Administered 2016-12-31 – 2017-01-01 (×2): 81 mg via ORAL
  Filled 2016-12-31 (×3): qty 1

## 2016-12-31 MED ORDER — FENTANYL CITRATE (PF) 100 MCG/2ML IJ SOLN
INTRAMUSCULAR | Status: DC | PRN
  Administered 2016-12-31: 12.5 ug via INTRAVENOUS
  Administered 2016-12-31: 25 ug via INTRAVENOUS

## 2016-12-31 MED ORDER — FINASTERIDE 5 MG PO TABS
5.0000 mg | ORAL_TABLET | Freq: Every day | ORAL | Status: DC
  Administered 2016-12-31 – 2017-01-01 (×2): 5 mg via ORAL
  Filled 2016-12-31 (×3): qty 1

## 2016-12-31 MED ORDER — MIDAZOLAM HCL 2 MG/2ML IJ SOLN
INTRAMUSCULAR | Status: AC
  Filled 2016-12-31: qty 2

## 2016-12-31 MED ORDER — SODIUM CHLORIDE 0.9 % IV SOLN
INTRAVENOUS | Status: DC | PRN
  Administered 2016-12-31: 60 mL

## 2016-12-31 MED ORDER — ACETAMINOPHEN 650 MG RE SUPP: 650.0000 mg | Freq: Four times a day (QID) | RECTAL | Status: DC | PRN

## 2016-12-31 MED ORDER — ONDANSETRON HCL 4 MG PO TABS: 4.0000 mg | ORAL_TABLET | Freq: Four times a day (QID) | ORAL | Status: DC | PRN

## 2016-12-31 MED ORDER — BUPIVACAINE-EPINEPHRINE (PF) 0.5% -1:200000 IJ SOLN
INTRAMUSCULAR | Status: DC | PRN
  Administered 2016-12-31: 30 mL via PERINEURAL

## 2016-12-31 MED ORDER — AMLODIPINE BESYLATE 5 MG PO TABS
5.0000 mg | ORAL_TABLET | Freq: Every day | ORAL | Status: DC
  Administered 2017-01-01: 5 mg via ORAL
  Filled 2016-12-31 (×2): qty 1

## 2016-12-31 MED ORDER — ACETAMINOPHEN 325 MG PO TABS
650.0000 mg | ORAL_TABLET | Freq: Four times a day (QID) | ORAL | Status: DC | PRN
  Administered 2016-12-31: 650 mg via ORAL
  Filled 2016-12-31: qty 2

## 2016-12-31 MED ORDER — SODIUM CHLORIDE 0.9 % IV SOLN
INTRAVENOUS | Status: DC | PRN
  Administered 2016-12-31: 20 ug/min via INTRAVENOUS

## 2016-12-31 MED ORDER — ONDANSETRON HCL 4 MG/2ML IJ SOLN: 4.0000 mg | Freq: Once | INTRAMUSCULAR | Status: DC | PRN

## 2016-12-31 MED ORDER — SODIUM CHLORIDE 0.9 % IJ SOLN
INTRAMUSCULAR | Status: AC
  Filled 2016-12-31: qty 50

## 2016-12-31 MED ORDER — NEOMYCIN-POLYMYXIN B GU 40-200000 IR SOLN
Status: DC | PRN
  Administered 2016-12-31: 2 mL

## 2016-12-31 MED ORDER — PROPOFOL 10 MG/ML IV BOLUS
INTRAVENOUS | Status: AC
  Filled 2016-12-31: qty 40

## 2016-12-31 MED ORDER — TRANEXAMIC ACID 1000 MG/10ML IV SOLN
INTRAVENOUS | Status: AC
  Filled 2016-12-31: qty 10

## 2016-12-31 MED ORDER — BUPIVACAINE-EPINEPHRINE (PF) 0.5% -1:200000 IJ SOLN
INTRAMUSCULAR | Status: AC
  Filled 2016-12-31: qty 30

## 2016-12-31 MED ORDER — ONDANSETRON HCL 4 MG/2ML IJ SOLN
INTRAMUSCULAR | Status: DC | PRN
  Administered 2016-12-31: 4 mg via INTRAVENOUS

## 2016-12-31 MED ORDER — PROPOFOL 500 MG/50ML IV EMUL
INTRAVENOUS | Status: DC | PRN
  Administered 2016-12-31: 75 ug/kg/min via INTRAVENOUS

## 2016-12-31 MED ORDER — LEVOTHYROXINE SODIUM 137 MCG PO TABS
137.0000 ug | ORAL_TABLET | Freq: Every day | ORAL | Status: DC
  Administered 2017-01-01: 137 ug via ORAL
  Filled 2016-12-31: qty 1

## 2016-12-31 MED ORDER — BUPIVACAINE HCL (PF) 0.5 % IJ SOLN
INTRAMUSCULAR | Status: DC | PRN
  Administered 2016-12-31: 3 mL

## 2016-12-31 MED ORDER — FLEET ENEMA 7-19 GM/118ML RE ENEM: 1.0000 | ENEMA | Freq: Once | RECTAL | Status: DC | PRN

## 2016-12-31 MED ORDER — CEFAZOLIN SODIUM-DEXTROSE 2-4 GM/100ML-% IV SOLN
2.0000 g | Freq: Once | INTRAVENOUS | Status: AC
  Administered 2016-12-31: 2 g via INTRAVENOUS

## 2016-12-31 MED ORDER — NEOMYCIN-POLYMYXIN B GU 40-200000 IR SOLN
Status: AC
  Filled 2016-12-31: qty 20

## 2016-12-31 MED ORDER — HYDROMORPHONE HCL 1 MG/ML IJ SOLN: 0.5000 mg | INTRAMUSCULAR | Status: DC | PRN

## 2016-12-31 MED ORDER — BUPIVACAINE HCL (PF) 0.5 % IJ SOLN
INTRAMUSCULAR | Status: AC
  Filled 2016-12-31: qty 10

## 2016-12-31 SURGICAL SUPPLY — 57 items
BANDAGE ACE 6X5 VEL STRL LF (GAUZE/BANDAGES/DRESSINGS) ×3 IMPLANT
BONE CEMENT PALACOSE (Cement) ×3 IMPLANT
CANISTER SUCT 1200ML W/VALVE (MISCELLANEOUS) ×3 IMPLANT
CANISTER SUCT 3000ML PPV (MISCELLANEOUS) ×3 IMPLANT
CAPT KNEE PARTIAL 2 ×3 IMPLANT
CATH FOL LEG HOLDER (MISCELLANEOUS) ×3 IMPLANT
CATH TRAY METER 16FR LF (MISCELLANEOUS) ×3 IMPLANT
CEMENT BONE PALACOSE (Cement) ×1 IMPLANT
CHLORAPREP W/TINT 26ML (MISCELLANEOUS) ×6 IMPLANT
COOLER POLAR GLACIER W/PUMP (MISCELLANEOUS) ×3 IMPLANT
COVER MAYO STAND STRL (DRAPES) ×3 IMPLANT
CUFF TOURN 24 STER (MISCELLANEOUS) IMPLANT
CUFF TOURN 30 STER DUAL PORT (MISCELLANEOUS) IMPLANT
DRAPE C-ARM XRAY 36X54 (DRAPES) IMPLANT
DRSG OPSITE POSTOP 4X12 (GAUZE/BANDAGES/DRESSINGS) IMPLANT
DRSG OPSITE POSTOP 4X14 (GAUZE/BANDAGES/DRESSINGS) ×3 IMPLANT
DRSG OPSITE POSTOP 4X6 (GAUZE/BANDAGES/DRESSINGS) ×3 IMPLANT
ELECT CAUTERY BLADE 6.4 (BLADE) ×3 IMPLANT
ELECT REM PT RETURN 9FT ADLT (ELECTROSURGICAL) ×3
ELECTRODE REM PT RTRN 9FT ADLT (ELECTROSURGICAL) ×1 IMPLANT
GAUZE PETRO XEROFOAM 1X8 (MISCELLANEOUS) ×3 IMPLANT
GAUZE SPONGE 4X4 12PLY STRL (GAUZE/BANDAGES/DRESSINGS) ×3 IMPLANT
GLOVE BIO SURGEON STRL SZ7.5 (GLOVE) ×12 IMPLANT
GLOVE BIO SURGEON STRL SZ8 (GLOVE) ×12 IMPLANT
GLOVE BIOGEL PI IND STRL 8 (GLOVE) ×1 IMPLANT
GLOVE BIOGEL PI INDICATOR 8 (GLOVE) ×2
GLOVE INDICATOR 8.0 STRL GRN (GLOVE) ×3 IMPLANT
GOWN STRL REUS W/ TWL LRG LVL3 (GOWN DISPOSABLE) ×1 IMPLANT
GOWN STRL REUS W/ TWL XL LVL3 (GOWN DISPOSABLE) ×1 IMPLANT
GOWN STRL REUS W/TWL LRG LVL3 (GOWN DISPOSABLE) ×2
GOWN STRL REUS W/TWL XL LVL3 (GOWN DISPOSABLE) ×2
HOOD PEEL AWAY FLYTE STAYCOOL (MISCELLANEOUS) ×9 IMPLANT
KIT RM TURNOVER STRD PROC AR (KITS) ×3 IMPLANT
MAT BLUE FLOOR 46X72 FLO (MISCELLANEOUS) ×3 IMPLANT
NDL SAFETY 18GX1.5 (NEEDLE) ×3 IMPLANT
NEEDLE SPNL 20GX3.5 QUINCKE YW (NEEDLE) ×3 IMPLANT
NS IRRIG 1000ML POUR BTL (IV SOLUTION) ×3 IMPLANT
PACK BLADE SAW RECIP 70 3 PT (BLADE) ×3 IMPLANT
PACK TOTAL KNEE (MISCELLANEOUS) ×3 IMPLANT
PAD WRAPON POLAR KNEE (MISCELLANEOUS) ×1 IMPLANT
PULSAVAC PLUS IRRIG FAN TIP (DISPOSABLE) ×3
SOL .9 NS 3000ML IRR  AL (IV SOLUTION) ×2
SOL .9 NS 3000ML IRR UROMATIC (IV SOLUTION) ×1 IMPLANT
SPONGE XRAY 4X4 16PLY STRL (MISCELLANEOUS) ×3 IMPLANT
STAPLER SKIN PROX 35W (STAPLE) ×3 IMPLANT
STRAP SAFETY BODY (MISCELLANEOUS) ×3 IMPLANT
SUCTION FRAZIER HANDLE 10FR (MISCELLANEOUS) ×2
SUCTION TUBE FRAZIER 10FR DISP (MISCELLANEOUS) ×1 IMPLANT
SUT VIC AB 2-0 CT1 27 (SUTURE) ×8
SUT VIC AB 2-0 CT1 TAPERPNT 27 (SUTURE) ×4 IMPLANT
SYR 20CC LL (SYRINGE) ×3 IMPLANT
SYR 30ML LL (SYRINGE) ×9 IMPLANT
SYRINGE 10CC LL (SYRINGE) ×3 IMPLANT
SYSTEM VACUUM CEMENT MIXING (MISCELLANEOUS) ×3 IMPLANT
TAPE TRANSPORE STRL 2 31045 (GAUZE/BANDAGES/DRESSINGS) ×3 IMPLANT
TIP FAN IRRIG PULSAVAC PLUS (DISPOSABLE) ×1 IMPLANT
WRAPON POLAR PAD KNEE (MISCELLANEOUS) ×3

## 2016-12-31 NOTE — Op Note (Signed)
12/31/2016  12:54 PM  Patient:   Richard Castaneda  Pre-Op Diagnosis:   Osteoarthritis of medial compartment, left knee.  Post-Op Diagnosis:   Same  Procedure:   Left unicondylar knee arthroplasty.  Surgeon:   Maryagnes Amos, MD  Assistant:   Horris Latino, PA-C  Anesthesia:   Spinal  Findings:   As above.  Complications:   None  EBL:   10 cc  Fluids:   500 cc crystalloid  UOP:   100 cc  TT:   70 minutes at 300 mmHg  Drains:   None  Closure:   Staples  Implants:   All-cemented Biomet Oxford system with a small femoral component, a "C" sized tibial tray, and a 4 mm meniscal bearing insert.  Brief Clinical Note:   The patient is an 83 year old male with a long history of gradually worsening left knee pain. His symptoms have progressed despite medications, activity modification, injections, etc. His history and examination are consistent with advanced degenerative joint disease, primarily involving the medial compartment. The patient presents at this time for a left partial knee replacement.  Procedure:   The patient was brought into the operating room and a spinal placed by the anesthesiologist. The patient was lain in the supine position and a Foley catheter inserted. The patient was repositioned so that the non-surgical leg was placed in a flexed and abducted position in the yellow fin leg holder while the surgical extremity was placed over the Biomet leg holder. The left lower extremity was prepped with ChloraPrep solution before being draped sterilely. Preoperative antibiotics were administered. After performing a timeout to verify the appropriate surgical site, the limb was exsanguinated with an Esmarch and the tourniquet inflated to 300 mmHg. A standard anterior approach to the knee was made through an approximately 3-3.5 inch incision. The incision was carried down through the subcutaneous tissues to expose the superficial retinaculum. This was split the length the incision  and medial flap elevated sufficiently to expose the medial retinaculum. This was incised along the medial border of the patella tendon and extended proximally along the medial border of the patella, leaving a 3-4 mm cuff of tissue. The soft tissues were elevated off the anteromedial aspect of the proximal tibia. The anterior portion of the meniscus was removed after performing a subtotal excision of the infrapatellar fat pad. The anterior cruciate ligament was inspected and found to be in excellent condition. Osteophytes were removed from the inferior pole of the patella as well as from the notch using a quarter-inch osteotome. There were significant degenerative changes of both the femur and tibia on the medial side. The medial femoral condyle was sized using the small and medium sizers. It was felt that the small guide best optimized the contour of the femur. This was left in place and the external tibial guide positioned. The coupling device was used to connect the guide to the medial femoral condylar sizer to optimize appropriate orientation. Two guide pins were inserted into the cutting block before the coupling device and sizer were removed. The appropriate tibial cut was made using the oscillating and reciprocating saws. The piece was removed in its entirety and taken to the back table where it was sized and found to be optimally replicated by a "C" sized component. The 8 mm spacer was inserted to verify that sufficient bone had been removed.  Attention was directed to femoral side. The intramedullary canal was accessed through a 4 mm drill hole. The intramedullary guide was positioned  before the guide for the femoral condylar holes was positioned. The appropriate coupling device connected this guide to the intramedullary guide before both drill holes were created in the distal aspect of the medial femoral condyle. The devices were removed and the posterior condylar cutting block inserted. The appropriate  cut was made using the reciprocating saw and this piece removed. The #0 spigot was inserted and the initial bone milling performed. A trial femoral component was inserted and both the flexion and extension gaps measured. In flexion, the gap measured 6 mm whereas in extension, it measured 4 mm. Therefore, the #2 spigot was selected and the secondary bone milling performed. Repeat sizing demonstrated symmetric flexion and extension gaps. The bone was removed from the postero-medial and postero-lateral aspects of the femoral condyle, as well as from the beneath the collar of the spigot. Bone also was removed from the anterior portion of the femur so as to minimize any potential impingement with the meniscal bearing insert. The trial components removed and several drill holes placed into the distal femoral condyle to further augment cement fixation.  Attention was redirected to the tibial side. The "C" sized tibial tray was positioned and temporarily secured using the appropriate spiked nail. The keel was created using the bi-bladed reciprocating saw and hoe. The keeled "C" sized trial tibial tray was inserted to be sure that it seated properly. At this point, a total of 20 cc of Exparel diluted out to 60 cc with normal saline and 30 cc of 0.5% Sensorcaine was injected in and around the posterior and medial capsular tissues, as well as the peri-incisional tissues to help with postoperative pain control.  The bony surfaces were prepared for cementing by irrigating them thoroughly with bacitracin saline solution using the jet lavage system before packing them with a dry Ray-Tec sponge. Meanwhile, cement was being mixed on the back table. When the cement was ready, the tibial tray was cemented in first. The excess cement was removed using a Public house managerWoodson elevator after impacting it into place. Next, the femoral component was impacted into place. Again the excess cement was removed using a Public house managerWoodson elevator. The 4 mm spacer  was inserted and the knee brought into near full extension while the cement hardened. Once the cement hardened, the spacer was removed and the 4 mm meniscal bearing insert was trialed. This demonstrated excellent tracking while the knee was placed through a range of motion, and showed no evidence towards subluxation or dislocation. In addition, it did not fit too tightly. Therefore, the permanent 4 mm meniscal bearing insert was snapped into position after verifying that no cement had been retained posteriorly. Again the knee was placed through a range of motion with the findings as described above.  The wound was copiously irrigated with bacitracin saline solution via the jet lavage system before the retinacular layer was reapproximated using #0 Vicryl interrupted sutures. At this point, 1 g of transexemic acid in 10 cc of normal saline was injected intra-articularly. The subcutaneous tissues were closed in two layers using 2-0 Vicryl interrupted sutures before the skin was closed using staples. A sterile occlusive dressing was applied to the knee before the patient was awakened. The patient was transferred back to his/her hospital bed and returned to the recovery room in satisfactory condition after tolerating the procedure well. A Polar Care device was applied to the knee as well.

## 2016-12-31 NOTE — Transfer of Care (Signed)
Immediate Anesthesia Transfer of Care Note  Patient: Richard Castaneda  Procedure(s) Performed: Procedure(s): UNICOMPARTMENTAL KNEE (Left)  Patient Location: PACU  Anesthesia Type:Spinal  Level of Consciousness: awake, alert  and oriented  Airway & Oxygen Therapy: Patient Spontanous Breathing and Patient connected to face mask oxygen  Post-op Assessment: Report given to RN and Post -op Vital signs reviewed and stable  Post vital signs: Reviewed and stable  Last Vitals:  Vitals:   12/31/16 0943 12/31/16 1320  BP: (!) 178/75 122/60  Pulse: 73 80  Resp: 19 16  Temp: (!) 36.3 C 36.9 C  SpO2: 99% 99%    Last Pain:  Vitals:   12/31/16 0943  TempSrc: Tympanic         Complications: No apparent anesthesia complications

## 2016-12-31 NOTE — Evaluation (Signed)
Physical Therapy Evaluation Patient Details Name: Jamerion Cabello MRN: [id number] DOB: 1929-12-24 Today's Date: 12/31/2016   History of Present Illness  Pt admitted for L unicompartmental knee.  Clinical Impression  Pt is a pleasant 83 year old male who was admitted for L uni knee. Pt performs bed mobility, transfers, and ambulation with cga and RW. Pt demonstrates deficits with strength/mobility/endurance. Able to perform SLR x 10 reps, does not need KI at this time. Motivated to work with therapy. Would benefit from skilled PT to address above deficits and promote optimal return to PLOF. Recommend transition to HHPT upon discharge from acute hospitalization.       Follow Up Recommendations Home health PT    Equipment Recommendations  None recommended by PT    Recommendations for Other Services       Precautions / Restrictions Precautions Precautions: Fall;Knee Precaution Booklet Issued: No Restrictions Weight Bearing Restrictions: Yes LLE Weight Bearing: Weight bearing as tolerated      Mobility  Bed Mobility Overal bed mobility: Needs Assistance Bed Mobility: Supine to Sit     Supine to sit: Min guard     General bed mobility comments: safe technique performed with upright posture noted.  Transfers Overall transfer level: Needs assistance Equipment used: Rolling walker (2 wheeled) Transfers: Sit to/from Stand Sit to Stand: Min guard         General transfer comment: safe technique performed with RW. Educated on WB status  Ambulation/Gait Ambulation/Gait assistance: Min Government social research officer (Feet): 40 Feet Assistive device: Rolling walker (2 wheeled) Gait Pattern/deviations: Step-to pattern     General Gait Details: ambulated in room with RW and safe technique. Slightly impulsive, needs cues to wait for therapist.   Stairs            Wheelchair Mobility    Modified Rankin (Stroke Patients Only)       Balance Overall balance  assessment: Needs assistance Sitting-balance support: Feet supported Sitting balance-Leahy Scale: Normal     Standing balance support: Bilateral upper extremity supported Standing balance-Leahy Scale: Good                               Pertinent Vitals/Pain Pain Assessment: No/denies pain    Home Living Family/patient expects to be discharged to:: Private residence Living Arrangements: Spouse/significant other Available Help at Discharge: Family Type of Home: House Home Access: Stairs to enter Entrance Stairs-Rails: None Secretary/administrator of Steps: 1 Home Layout: One level Home Equipment: Environmental consultant - 2 wheels      Prior Function Level of Independence: Independent         Comments: indep prior to admission     Hand Dominance        Extremity/Trunk Assessment   Upper Extremity Assessment Upper Extremity Assessment: Overall WFL for tasks assessed    Lower Extremity Assessment Lower Extremity Assessment: Generalized weakness (L knee 3/5; R LE grossly 5/5)       Communication   Communication: No difficulties  Cognition Arousal/Alertness: Awake/alert Behavior During Therapy: WFL for tasks assessed/performed Overall Cognitive Status: Within Functional Limits for tasks assessed                                        General Comments      Exercises Total Joint Exercises Goniometric ROM: L knee 0-80 degrees Other Exercises Other Exercises: supine  ther-ex performed x 10 reps on L LE including ankle pumps, quad sets, SLRs, and hip abd/add. Safe technique with cga.   Assessment/Plan    PT Assessment Patient needs continued PT services  PT Problem List Decreased strength;Decreased range of motion;Decreased balance;Decreased mobility       PT Treatment Interventions DME instruction;Gait training;Stair training;Therapeutic exercise    PT Goals (Current goals can be found in the Care Plan section)  Acute Rehab PT Goals Patient  Stated Goal: to get stronger PT Goal Formulation: With patient Time For Goal Achievement: 01/14/17 Potential to Achieve Goals: Good    Frequency BID   Barriers to discharge        Co-evaluation               AM-PAC PT "6 Clicks" Daily Activity  Outcome Measure Difficulty turning over in bed (including adjusting bedclothes, sheets and blankets)?: None Difficulty moving from lying on back to sitting on the side of the bed? : Unable Difficulty sitting down on and standing up from a chair with arms (e.g., wheelchair, bedside commode, etc,.)?: Unable Help needed moving to and from a bed to chair (including a wheelchair)?: A Little Help needed walking in hospital room?: A Little Help needed climbing 3-5 steps with a railing? : A Little 6 Click Score: 15    End of Session Equipment Utilized During Treatment: Gait belt Activity Tolerance: Patient tolerated treatment well Patient left: in chair;with chair alarm set;with family/visitor present;with SCD's reapplied Nurse Communication: Mobility status PT Visit Diagnosis: Muscle weakness (generalized) (M62.81);Difficulty in walking, not elsewhere classified (R26.2)    Time: [id number] PT Time Calculation (min) (ACUTE ONLY): 28 min   Charges:   PT Evaluation $PT Eval Low Complexity: 1 Low PT Treatments $Therapeutic Exercise: 8-22 mins   PT G Codes:   PT G-Codes **NOT FOR INPATIENT CLASS** Functional Assessment Tool Used: AM-PAC 6 Clicks Basic Mobility Functional Limitation: Mobility: Walking and moving around Mobility: Walking and Moving Around Current Status ([id number]): At least 40 percent but less than 60 percent impaired, limited or restricted Mobility: Walking and Moving Around Goal Status [phone]([id number]): At least 20 percent but less than 40 percent impaired, limited or restricted    Elizabeth PalauStephanie Ianna Salmela, PT, DPT [phone]   Wednesday Ericsson 12/31/2016, 5:15 PM

## 2016-12-31 NOTE — Progress Notes (Signed)
Applied foot pumps and polar care   Elevated heels  on towel rolls

## 2016-12-31 NOTE — Anesthesia Procedure Notes (Signed)
Spinal  Patient location during procedure: OR Staffing Anesthesiologist: Abrey Bradway Performed: anesthesiologist  Preanesthetic Checklist Completed: patient identified, site marked, surgical consent, pre-op evaluation, timeout performed, IV checked and risks and benefits discussed Spinal Block Patient position: sitting Prep: Betadine Patient monitoring: heart rate, cardiac monitor, continuous pulse ox and blood pressure Approach: midline Location: L3-4 Injection technique: single-shot Needle Needle type: Pencil-Tip  Needle gauge: 25 G Needle length: 9 cm Assessment Sensory level: T10     

## 2016-12-31 NOTE — Anesthesia Preprocedure Evaluation (Addendum)
Anesthesia Evaluation  Patient identified by MRN, date of birth, ID band Patient awake    Reviewed: Allergy & Precautions, NPO status , Patient's Chart, lab work & pertinent test results, reviewed documented beta blocker date and time   Airway Mallampati: II  TM Distance: >3 FB     Dental  (+) Chipped, Partial Lower, Partial Upper   Pulmonary           Cardiovascular hypertension, Pt. on medications + CAD, + Past MI, + Cardiac Stents and + Peripheral Vascular Disease       Neuro/Psych    GI/Hepatic   Endo/Other  Hypothyroidism   Renal/GU      Musculoskeletal  (+) Arthritis ,   Abdominal   Peds  Hematology   Anesthesia Other Findings   Reproductive/Obstetrics                            Anesthesia Physical Anesthesia Plan  ASA: III  Anesthesia Plan: Spinal   Post-op Pain Management:    Induction:   PONV Risk Score and Plan:   Airway Management Planned:   Additional Equipment:   Intra-op Plan:   Post-operative Plan:   Informed Consent: I have reviewed the patients History and Physical, chart, labs and discussed the procedure including the risks, benefits and alternatives for the proposed anesthesia with the patient or authorized representative who has indicated his/her understanding and acceptance.     Plan Discussed with: CRNA  Anesthesia Plan Comments:         Anesthesia Quick Evaluation

## 2016-12-31 NOTE — H&P (Signed)
Paper H&P to be scanned into permanent record. H&P reviewed and patient re-examined. No changes. 

## 2016-12-31 NOTE — [place] FL2 (Signed)
[place] MEDICAID FL2 LEVEL OF CARE SCREENING TOOL     IDENTIFICATION  Patient Name: Richard Castaneda Birthdate: [birth year] Sex: male Admission Date (Current Location): 12/31/2016  [place] and [place] Number:  Chiropodist and Address:  [facility], [address] [place], [place] [address]      Provider Number: [id number]  Attending Physician Name and Address:  Christena Flake, MD  Relative Name and Phone Number:       Current Level of Care: Hospital Recommended Level of Care: Skilled Nursing Facility Prior Approval Number:    Date Approved/Denied:   PASRR Number:  [id number])  Discharge Plan: SNF    Current Diagnoses: Patient Active Problem List   Diagnosis Date Noted  . Status post unicompartmental knee replacement, left 12/31/2016  . Borderline diabetes mellitus 11/16/2016  . Acquired hypothyroidism 11/16/2016  . Benign prostatic hyperplasia with urinary frequency 10/19/2016  . Primary osteoarthritis of left knee 08/26/2016  . Vaccine counseling 03/09/2016  . Medicare annual wellness visit, subsequent 03/09/2016  . Medicare annual wellness visit, initial 03/09/2016  . H/O iron deficiency anemia 09/09/2015  . Moderate tricuspid insufficiency 09/12/2014  . Moderate mitral insufficiency 09/12/2014  . PVD (peripheral vascular disease) (HCC) 12/29/2013  . Pure hypercholesterolemia 12/29/2013  . Coronary artery disease 12/29/2013  . Anterior myocardial infarction (HCC) 12/29/2013  . Benign essential HTN 12/29/2013    Orientation RESPIRATION BLADDER Height & Weight     Self, Time, Situation, Place  Normal Continent Weight: 150 lb (68 kg) Height:  5\' 4"  (162.6 cm)  BEHAVIORAL SYMPTOMS/MOOD NEUROLOGICAL BOWEL NUTRITION STATUS   (none)  (none) Continent Diet (Regular Diet )  AMBULATORY STATUS COMMUNICATION OF NEEDS Skin   Extensive Assist Verbally Surgical wounds (Incision: Left Knee )                       Personal Care  Assistance Level of Assistance  Bathing, Feeding, Dressing Bathing Assistance: Limited assistance Feeding assistance: Independent Dressing Assistance: Limited assistance     Functional Limitations Info  Sight, Hearing, Speech Sight Info: Adequate Hearing Info: Adequate Speech Info: Adequate    SPECIAL CARE FACTORS FREQUENCY  PT (By licensed PT), OT (By licensed OT)     PT Frequency:  (5) OT Frequency:  (5)            Contractures      Additional Factors Info  Code Status, Allergies Code Status Info:  (Full Code. ) Allergies Info:  (No Known Allergies. )           Current Medications (12/31/2016):  This is the current hospital active medication list Current Facility-Administered Medications  Medication Dose Route Frequency Provider Last Rate Last Dose  . acetaminophen (TYLENOL) tablet 650 mg  650 mg Oral Q6H PRN Poggi, Excell Seltzer, MD       Or  . acetaminophen (TYLENOL) suppository 650 mg  650 mg Rectal Q6H PRN Poggi, Excell Seltzer, MD      . acetaminophen (TYLENOL) tablet 1,000 mg  1,000 mg Oral Q6H Poggi, Excell Seltzer, MD   1,000 mg at 12/31/16 1519  . [START ON 01/01/2017] amLODipine (NORVASC) tablet 5 mg  5 mg Oral Daily Poggi, Excell Seltzer, MD      . aspirin EC tablet 81 mg  81 mg Oral Daily Poggi, Excell Seltzer, MD   81 mg at 12/31/16 1519  . bisacodyl (DULCOLAX) suppository 10 mg  10 mg Rectal Daily PRN Poggi, Excell Seltzer, MD      .  ceFAZolin (ANCEF) IVPB 2g/100 mL premix  2 g Intravenous Q6H Poggi, Excell SeltzerJohn J, MD   Stopped at 12/31/16 1550  . dextrose 5 % and 0.9 % NaCl with KCl 20 mEq/L infusion   Intravenous Continuous Poggi, Excell SeltzerJohn J, MD 75 mL/hr at 12/31/16 1520    . diphenhydrAMINE (BENADRYL) 12.5 MG/5ML elixir 12.5-25 mg  12.5-25 mg Oral Q4H PRN Poggi, Excell SeltzerJohn J, MD      . docusate sodium (COLACE) capsule 100 mg  100 mg Oral BID Poggi, Excell SeltzerJohn J, MD   100 mg at 12/31/16 1519  . [START ON 01/01/2017] enoxaparin (LOVENOX) injection 40 mg  40 mg Subcutaneous Q24H Poggi, Excell SeltzerJohn J, MD      . finasteride  (PROSCAR) tablet 5 mg  5 mg Oral Daily Poggi, Excell SeltzerJohn J, MD   5 mg at 12/31/16 1519  . HYDROmorphone (DILAUDID) injection 0.5-1 mg  0.5-1 mg Intravenous Q2H PRN Poggi, Excell SeltzerJohn J, MD      . Richard Castaneda[START ON 01/01/2017] levothyroxine (SYNTHROID, LEVOTHROID) tablet 137 mcg  137 mcg Oral QAC breakfast Poggi, Excell SeltzerJohn J, MD      . lisinopril (PRINIVIL,ZESTRIL) tablet 40 mg  40 mg Oral Daily Poggi, Excell SeltzerJohn J, MD      . magnesium hydroxide (MILK OF MAGNESIA) suspension 30 mL  30 mL Oral Daily PRN Poggi, Excell SeltzerJohn J, MD      . metoCLOPramide (REGLAN) tablet 5-10 mg  5-10 mg Oral Q8H PRN Poggi, Excell SeltzerJohn J, MD       Or  . metoCLOPramide (REGLAN) injection 5-10 mg  5-10 mg Intravenous Q8H PRN Poggi, Excell SeltzerJohn J, MD      . mirabegron ER (MYRBETRIQ) tablet 25 mg  25 mg Oral Daily Poggi, Excell SeltzerJohn J, MD   25 mg at 12/31/16 1519  . omega-3 acid ethyl esters (LOVAZA) capsule 1 g  1 g Oral Daily Poggi, Excell SeltzerJohn J, MD   1 g at 12/31/16 1519  . ondansetron (ZOFRAN) tablet 4 mg  4 mg Oral Q6H PRN Poggi, Excell SeltzerJohn J, MD       Or  . ondansetron (ZOFRAN) injection 4 mg  4 mg Intravenous Q6H PRN Poggi, Excell SeltzerJohn J, MD      . oxyCODONE (Oxy IR/ROXICODONE) immediate release tablet 5-10 mg  5-10 mg Oral Q3H PRN Poggi, Excell SeltzerJohn J, MD   5 mg at 12/31/16 1535  . pantoprazole (PROTONIX) EC tablet 40 mg  40 mg Oral Daily Poggi, Excell SeltzerJohn J, MD   40 mg at 12/31/16 1519  . simvastatin (ZOCOR) tablet 20 mg  20 mg Oral QHS Poggi, Excell SeltzerJohn J, MD      . sodium phosphate (FLEET) 7-19 GM/118ML enema 1 enema  1 enema Rectal Once PRN Poggi, Excell SeltzerJohn J, MD         Discharge Medications: Please see discharge summary for a list of discharge medications.  Relevant Imaging Results:  Relevant Lab Results:   Additional Information  (SSN: [id number])  Giulian Goldring, Darleen CrockerBailey M, LCSW

## 2016-12-31 NOTE — Anesthesia Post-op Follow-up Note (Signed)
Anesthesia QCDR form completed.        

## 2017-01-01 DIAGNOSIS — M1712 Unilateral primary osteoarthritis, left knee: Secondary | ICD-10-CM | POA: Diagnosis not present

## 2017-01-01 LAB — BASIC METABOLIC PANEL
ANION GAP: 5 (ref 5–15)
BUN: 22 mg/dL — ABNORMAL HIGH (ref 6–20)
CO2: 26 mmol/L (ref 22–32)
Calcium: 8.2 mg/dL — ABNORMAL LOW (ref 8.9–10.3)
Chloride: 106 mmol/L (ref 101–111)
Creatinine, Ser: 1.09 mg/dL (ref 0.61–1.24)
GFR calc non Af Amer: >60 mL/min (ref >60)
Glucose, Bld: 116 mg/dL — ABNORMAL HIGH (ref 65–99)
Potassium: 4.2 mmol/L (ref 3.5–5.1)
SODIUM: 137 mmol/L (ref 135–145)

## 2017-01-01 LAB — CBC WITH DIFFERENTIAL/PLATELET
BASOS ABS: 0 10*3/uL (ref 0–0.1)
BASOS PCT: 0 %
EOS ABS: 0.3 10*3/uL (ref 0–0.7)
Eosinophils Relative: 4 %
HCT: 34.1 % — ABNORMAL LOW (ref 40.0–52.0)
Hemoglobin: 11.7 g/dL — ABNORMAL LOW (ref 13.0–18.0)
Lymphocytes Relative: 13 %
Lymphs Abs: 1.1 10*3/uL (ref 1.0–3.6)
MCH: 33.5 pg (ref 26.0–34.0)
MCHC: 34.3 g/dL (ref 32.0–36.0)
MCV: 97.6 fL (ref 80.0–100.0)
MONO ABS: 1 10*3/uL (ref 0.2–1.0)
MONOS PCT: 12 %
NEUTROS PCT: 71 %
Neutro Abs: 6.2 10*3/uL (ref 1.4–6.5)
Platelets: 198 10*3/uL (ref 150–440)
RBC: 3.49 MIL/uL — ABNORMAL LOW (ref 4.40–5.90)
RDW: 12.6 % (ref 11.5–14.5)
WBC: 8.7 10*3/uL (ref 3.8–10.6)

## 2017-01-01 MED ORDER — OXYCODONE HCL 5 MG PO TABS: 5.0000 mg | ORAL_TABLET | ORAL | 0 refills | Status: DC | PRN

## 2017-01-01 MED ORDER — ENOXAPARIN SODIUM 40 MG/0.4ML ~~LOC~~ SOLN: 40.0000 mg | SUBCUTANEOUS | 0 refills | Status: DC

## 2017-01-01 NOTE — Discharge Summary (Signed)
Physician Discharge Summary  Patient ID: Richard Castaneda MRN: [id number] DOB/AGE: 1929-12-30 83 y.o.  Admit date: 12/31/2016 Discharge date: 01/01/2017  Admission Diagnoses:  PRIMARY OSTEOARTHRITIS LEFT KNEE Osteoarthritis of medial compartment of left knee.  Discharge Diagnoses: Patient Active Problem List   Diagnosis Date Noted  . Status post unicompartmental knee replacement, left 12/31/2016  . Borderline diabetes mellitus 11/16/2016  . Acquired hypothyroidism 11/16/2016  . Benign prostatic hyperplasia with urinary frequency 10/19/2016  . Primary osteoarthritis of left knee 08/26/2016  . Vaccine counseling 03/09/2016  . Medicare annual wellness visit, subsequent 03/09/2016  . Medicare annual wellness visit, initial 03/09/2016  . H/O iron deficiency anemia 09/09/2015  . Moderate tricuspid insufficiency 09/12/2014  . Moderate mitral insufficiency 09/12/2014  . PVD (peripheral vascular disease) (HCC) 12/29/2013  . Pure hypercholesterolemia 12/29/2013  . Coronary artery disease 12/29/2013  . Anterior myocardial infarction (HCC) 12/29/2013  . Benign essential HTN 12/29/2013  Osteoarthritis of medial compartment of left knee  Past Medical History:  Diagnosis Date  . Acquired hypothyroidism 11/16/2016  . Anterior myocardial infarction (HCC) 12/29/2013   Overview:  2004  . Benign essential HTN 12/29/2013  . Benign prostatic hyperplasia with urinary frequency 10/19/2016  . Borderline diabetes mellitus 11/16/2016  . Coronary artery disease 12/29/2013   Overview:  Anterior MI, PCI and stent placement of LAD11/04  . H/O iron deficiency anemia 09/09/2015  . History of BPH   . Moderate tricuspid insufficiency 09/12/2014  . Occasional tremors    Left hand only with writing and eating  . Pre-diabetes   . Primary osteoarthritis of left knee 08/26/2016  . Pure hypercholesterolemia 12/29/2013  . PVD (peripheral vascular disease) (HCC) 12/29/2013   Overview:  With carotid atherosclerosis     Transfusion: None.   Consultants (if any):   Discharged Condition: Improved  Hospital Course: Richard KickHoward Memmer is an 83 y.o. male who was admitted 12/31/2016 with a diagnosis of osteoarthritis of medial compartment of the left knee and went to the operating room on 12/31/2016 and underwent the above named procedures.    Surgeries: Procedure(s): UNICOMPARTMENTAL KNEE on 12/31/2016 Patient tolerated the surgery well. Taken to PACU where she was stabilized and then transferred to the orthopedic floor.  Started on Lovenox 40mg  q 24 hrs. Foot pumps applied bilaterally at 80 mm. Heels elevated on bed with rolled towels. No evidence of DVT. Negative Homan. Physical therapy started on day #1 for gait training and transfer. OT started day #1 for ADL and assisted devices.  Patient's IV and foley removed on POD1.  Implants: All-cemented [employer] [place] system with a small femoral component, a "C" sized tibial tray, and a 4 mm meniscal bearing insert.  He was given perioperative antibiotics:  Anti-infectives    Start     Dose/Rate Route Frequency Ordered Stop   12/31/16 1500  ceFAZolin (ANCEF) IVPB 2g/100 mL premix     2 g 200 mL/hr over 30 Minutes Intravenous Every 6 hours 12/31/16 1440 01/01/17 0331   12/31/16 0230  ceFAZolin (ANCEF) IVPB 2g/100 mL premix     2 g 200 mL/hr over 30 Minutes Intravenous  Once 12/31/16 0218 12/31/16 1135    .  He was given sequential compression devices, early ambulation, and lovenox for DVT prophylaxis.  He benefited maximally from the hospital stay and there were no complications.    Recent vital signs:  Vitals:   01/01/17 0308 01/01/17 0714  BP: (!) 151/56 (!) 162/66  Pulse: 77 81  Resp: 19   Temp: 98.7 F (37.1  C) 98.4 F (36.9 C)  SpO2: 92% 98%    Recent laboratory studies:  Lab Results  Component Value Date   HGB 11.7 (L) 01/01/2017   HGB 13.5 12/24/2016   HGB 13.2 03/16/2012   Lab Results  Component Value Date   WBC 8.7 01/01/2017   PLT  198 01/01/2017   Lab Results  Component Value Date   INR 1.09 12/24/2016   Lab Results  Component Value Date   NA 137 01/01/2017   K 4.2 01/01/2017   CL 106 01/01/2017   CO2 26 01/01/2017   BUN 22 (H) 01/01/2017   CREATININE 1.09 01/01/2017   GLUCOSE 116 (H) 01/01/2017    Discharge Medications:   Allergies as of 01/01/2017   No Known Allergies     Medication List    TAKE these medications   acetaminophen 650 MG CR tablet Commonly known as:  TYLENOL Take 650 mg by mouth 3 (three) times daily.   amLODipine 5 MG tablet Commonly known as:  NORVASC Take 5 mg by mouth daily.   aspirin EC 81 MG tablet Take 81 mg by mouth daily.   enoxaparin 40 MG/0.4ML injection Commonly known as:  LOVENOX Inject 0.4 mLs (40 mg total) into the skin daily.   finasteride 5 MG tablet Commonly known as:  PROSCAR TAKE 1 TABLET (5 MG TOTAL) BY MOUTH ONCE DAILY.   Fish Oil 1200 MG Caps Take 1,200 mg by mouth daily.   levothyroxine 137 MCG tablet Commonly known as:  SYNTHROID, LEVOTHROID TAKE 1 TABLET BY MOUTH ONCE DAILY ON EMPTY STOMACH WITH WATER 30-60 MIN BEFORE BREAKFAST   lisinopril 40 MG tablet Commonly known as:  PRINIVIL,ZESTRIL Take 40 mg by mouth daily.   mirabegron ER 25 MG Tb24 tablet Commonly known as:  MYRBETRIQ Take 1 tablet (25 mg total) by mouth daily.   OSTEO BI-FLEX JOINT SHIELD PO Take 1 tablet by mouth 2 (two) times daily.   oxyCODONE 5 MG immediate release tablet Commonly known as:  Oxy IR/ROXICODONE Take 1-2 tablets (5-10 mg total) by mouth every 4 (four) hours as needed for breakthrough pain.   simvastatin 20 MG tablet Commonly known as:  ZOCOR Take 20 mg by mouth at bedtime.            Durable Medical Equipment        Start     Ordered   12/31/16 1441  DME Walker rolling  Once    Question:  Patient needs a walker to treat with the following condition  Answer:  Status post unicompartmental knee replacement, left   12/31/16 1440   12/31/16 1441   DME 3 n 1  Once     12/31/16 1440   12/31/16 1441  DME Bedside commode  Once    Question:  Patient needs a bedside commode to treat with the following condition  Answer:  Status post unicompartmental knee replacement, left   12/31/16 1440       Discharge Care Instructions        Start     Ordered   01/02/17 0000  enoxaparin (LOVENOX) 40 MG/0.4ML injection  Every 24 hours    Question:  Supervising Provider  Answer:  Christena Flake   01/01/17 0750   01/01/17 0000  oxyCODONE (OXY IR/ROXICODONE) 5 MG immediate release tablet  Every 4 hours PRN    Question:  Supervising Provider  Answer:  Christena Flake   01/01/17 0750      Diagnostic Studies: Dg Chest  2 View  Result Date: 12/24/2016 CLINICAL DATA:  Preadmission evaluation for upcoming knee replacement, initial encounter EXAM: CHEST  2 VIEW COMPARISON:  None. FINDINGS: The heart size and mediastinal contours are within normal limits. Both lungs are clear. The visualized skeletal structures are unremarkable. IMPRESSION: No active cardiopulmonary disease. Electronically Signed   By: Alcide Clever M.D.   On: 12/24/2016 14:48   Dg Knee Left Port  Result Date: 12/31/2016 CLINICAL DATA:  Postop day lateral unicompartmental left knee arthroplasty. EXAM: PORTABLE LEFT KNEE - 1-2 VIEW COMPARISON:  None. FINDINGS: Anatomic alignment post lateral unicompartmental left knee arthroplasty. No complicating features. Well-preserved lateral compartment joint space. Marked patellofemoral compartment joint space narrowing. Extensive femoropopliteal and tibioperoneal artery atherosclerosis. IMPRESSION: Anatomic alignment post lateral unicompartmental left knee arthroplasty without acute complicating features. Electronically Signed   By: Hulan Saas M.D.   On: 12/31/2016 14:09   Disposition:  Plan is for discharge home with HHPT today.  Follow-up Information    Anson [place], PA-C Follow up in 14 day(s).   Specialty:  Physician Assistant Why:   Mindi Slicker information: [address] [place] [address] [phone]          Signed: Meriel Pica PA-C 01/01/2017, 7:51 AM

## 2017-01-01 NOTE — Progress Notes (Signed)
  Subjective: 1 Day Post-Op Procedure(s) (LRB): UNICOMPARTMENTAL KNEE (Left) Patient reports pain as mild.   Patient is well, and has had no acute complaints or problems PT and care management to assist with discharge planning, currently recommending HHPT. Negative for chest pain and shortness of breath Fever: no Gastrointestinal:Negative for nausea and vomiting  Objective: Vital signs in last 24 hours: Temp:  [97.3 F (36.3 C)-98.7 F (37.1 C)] 98.4 F (36.9 C) (08/31 0714) Pulse Rate:  [58-81] 81 (08/31 0714) Resp:  [13-19] 19 (08/31 0308) BP: (122-178)/(56-76) 162/66 (08/31 0714) SpO2:  [92 %-100 %] 98 % (08/31 0714) Weight:  [68 kg (150 lb)] 68 kg (150 lb) (08/30 0943)  Intake/Output from previous day:  Intake/Output Summary (Last 24 hours) at 01/01/17 0746 Last data filed at 01/01/17 0317  Gross per 24 hour  Intake          1896.25 ml  Output             1285 ml  Net           611.25 ml    Intake/Output this shift: No intake/output data recorded.  Labs:  Recent Labs  01/01/17 0438  HGB 11.7*    Recent Labs  01/01/17 0438  WBC 8.7  RBC 3.49*  HCT 34.1*  PLT 198    Recent Labs  01/01/17 0438  NA 137  K 4.2  CL 106  CO2 26  BUN 22*  CREATININE 1.09  GLUCOSE 116*  CALCIUM 8.2*   No results for input(s): LABPT, INR in the last 72 hours.   EXAM General - Patient is Alert, Appropriate and Oriented Extremity - Sensation intact distally Intact pulses distally Dorsiflexion/Plantar flexion intact Incision: dressing C/D/I No cellulitis present Dressing/Incision - clean, dry, no drainage Motor Function - intact, moving foot and toes well on exam.   Abdomen soft with palpation, normal BS.  Past Medical History:  Diagnosis Date  . Acquired hypothyroidism 11/16/2016  . Anterior myocardial infarction (HCC) 12/29/2013   Overview:  2004  . Benign essential HTN 12/29/2013  . Benign prostatic hyperplasia with urinary frequency 10/19/2016  . Borderline  diabetes mellitus 11/16/2016  . Coronary artery disease 12/29/2013   Overview:  Anterior MI, PCI and stent placement of LAD11/04  . H/O iron deficiency anemia 09/09/2015  . History of BPH   . Moderate tricuspid insufficiency 09/12/2014  . Occasional tremors    Left hand only with writing and eating  . Pre-diabetes   . Primary osteoarthritis of left knee 08/26/2016  . Pure hypercholesterolemia 12/29/2013  . PVD (peripheral vascular disease) (HCC) 12/29/2013   Overview:  With carotid atherosclerosis     Assessment/Plan: 1 Day Post-Op Procedure(s) (LRB): UNICOMPARTMENTAL KNEE (Left) Active Problems:   Status post unicompartmental knee replacement, left  Estimated body mass index is 25.75 kg/m as calculated from the following:   Height as of this encounter: 5\' 4"  (1.626 m).   Weight as of this encounter: 68 kg (150 lb). Advance diet Up with therapy D/C IV fluids when tolerating PO intake.  Labs reviewed, Hg 11.7 Up with therapy today, currently recommending HHPT. Plan will be for discharge today following sessions with PT.  DVT Prophylaxis - Lovenox, Foot Pumps and TED hose Weight-Bearing as tolerated to left leg  J. Horris LatinoLance McGhee, PA-C [facility] 01/01/2017, 7:46 AM

## 2017-01-01 NOTE — Anesthesia Postprocedure Evaluation (Signed)
Anesthesia Post Note  Patient: Richard Castaneda  Procedure(s) Performed: Procedure(s) (LRB): UNICOMPARTMENTAL KNEE (Left)  Patient location during evaluation: Nursing Unit Anesthesia Type: Spinal Level of consciousness: oriented and awake and alert Pain management: pain level controlled Vital Signs Assessment: post-procedure vital signs reviewed and stable Respiratory status: spontaneous breathing, respiratory function stable and patient connected to nasal cannula oxygen Cardiovascular status: blood pressure returned to baseline and stable Postop Assessment: no headache and no backache Anesthetic complications: no     Last Vitals:  Vitals:   01/01/17 0308 01/01/17 0714  BP: (!) 151/56 (!) 162/66  Pulse: 77 81  Resp: 19   Temp: 37.1 C 36.9 C  SpO2: 92% 98%    Last Pain:  Vitals:   01/01/17 0714  TempSrc: Oral  PainSc:                  Rica MastBachich,  Avril Busser M

## 2017-01-01 NOTE — Progress Notes (Signed)
Clinical Child psychotherapistocial Worker (CSW) received SNF consult. PT is recommending home health. RN case manager aware of above. Patient is discharging home today. Please reconsult if future social work needs arise. CSW signing off.   Baker Hughes IncorporatedBailey Dat Derksen, LCSW [phone]

## 2017-01-01 NOTE — Discharge Instructions (Signed)

## 2017-01-01 NOTE — Progress Notes (Signed)
Patient is being discharged home with son. Patient has no complaints or SS of distress.

## 2017-01-01 NOTE — Progress Notes (Signed)
Physical Therapy Treatment Patient Details Name: Richard Castaneda MRN: [id number] DOB: 1929/06/17 Today's Date: 01/01/2017    History of Present Illness Pt admitted for L unicompartmental knee.    PT Comments    Richard Castaneda made good progress with mobility, although pt lethargic and confused at times likely due to pain medication.  He requires assist to complete stair training and discussed safe technique with family who verbalized understanding.  Pt will benefit from continued skilled PT services to increase functional independence and safety.    Follow Up Recommendations  Home health PT     Equipment Recommendations  None recommended by PT    Recommendations for Other Services       Precautions / Restrictions Precautions Precautions: Fall;Knee Precaution Booklet Issued: No Precaution Comments: Reviewed no pillow under knee Restrictions Weight Bearing Restrictions: Yes LLE Weight Bearing: Weight bearing as tolerated    Mobility  Bed Mobility Overal bed mobility: Needs Assistance Bed Mobility: Supine to Sit     Supine to sit: Min guard;HOB elevated     General bed mobility comments: Pt demonstrates safe technique and does not require assist or cues.  Transfers Overall transfer level: Needs assistance Equipment used: Rolling walker (2 wheeled) Transfers: Sit to/from Stand Sit to Stand: Min guard         General transfer comment: Cues for proper hand placement.  Pt stood from mat table x2, from bed x1.    Ambulation/Gait Ambulation/Gait assistance: Min guard Ambulation Distance (Feet): 100 Feet Assistive device: Rolling walker (2 wheeled) Gait Pattern/deviations: Step-to pattern;Decreased stance time - left;Decreased step length - right;Decreased weight shift to left;Step-through pattern;Antalgic Gait velocity: decreased Gait velocity interpretation: Below normal speed for age/gender General Gait Details: Cues for step through gait pattern which pt was able to  demonstrate back to this therapist.  Cues for upright posture and forward gaze.  Steady gait using RW with no LOB.   Stairs Stairs: Yes   Stair Management: One rail Right;Step to pattern;Forwards Number of Stairs: 4 General stair comments: Demonstration and cues for proper technique.  Pt requires assist to steady, especially during descent.  Discussed with son and wife alternative method of placing WC on top step and having pt back up to steps and step up onto first step before sitting down in chair.  [facility] to utilize [facility] as much as possible rather than having to go up/down steps each time he needs to use the restroom.  Wheelchair Mobility    Modified Rankin (Stroke Patients Only)       Balance Overall balance assessment: Needs assistance Sitting-balance support: Feet supported;No upper extremity supported Sitting balance-Leahy Scale: Good     Standing balance support: Bilateral upper extremity supported;During functional activity Standing balance-Leahy Scale: Poor Standing balance comment: Pt relies on UE support for static and dynamic activities                            Cognition Arousal/Alertness: Awake/alert Behavior During Therapy: WFL for tasks assessed/performed Overall Cognitive Status: Impaired/Different from baseline Area of Impairment: Problem solving;Following commands;Safety/judgement                       Following Commands: Follows one step commands inconsistently;Follows one step commands with increased time Safety/Judgement: Decreased awareness of safety   Problem Solving: Slow processing;Requires verbal cues General Comments: Suspect due to medication. Pt with some confusion this morning after two doses of percocet per RN.  Pt requires repeated verbal cues for directing while ambulating in hallway.  He reports several times that he is sleepy.      Exercises Total Joint Exercises Long Arc Quad: Strengthening;Left;10 reps;Seated Knee  Flexion: AAROM;Left;Seated;5 reps;Other (comment) (with 5 second holds) Goniometric ROM: ~90 deg L knee AAROM in sitting Marching in Standing: Both;10 reps;Seated    General Comments General comments (skin integrity, edema, etc.): Son and wife present during session.      Pertinent Vitals/Pain Pain Assessment: Faces Faces Pain Scale: Hurts a little bit Pain Location: L knee Pain Descriptors / Indicators: Sore Pain Intervention(s): Limited activity within patient's tolerance;Monitored during session;Repositioned;Premedicated before session;Other (comment) (Polar care applied at end of session)    Home Living                      Prior Function            PT Goals (current goals can now be found in the care plan section) Acute Rehab PT Goals Patient Stated Goal: to get stronger PT Goal Formulation: With patient Time For Goal Achievement: 01/14/17 Potential to Achieve Goals: Good Progress towards PT goals: Progressing toward goals    Frequency    BID      PT Plan Current plan remains appropriate    Co-evaluation              AM-PAC PT "6 Clicks" Daily Activity  Outcome Measure  Difficulty turning over in bed (including adjusting bedclothes, sheets and blankets)?: None Difficulty moving from lying on back to sitting on the side of the bed? : A Lot Difficulty sitting down on and standing up from a chair with arms (e.g., wheelchair, bedside commode, etc,.)?: A Little Help needed moving to and from a bed to chair (including a wheelchair)?: A Little Help needed walking in hospital room?: A Little Help needed climbing 3-5 steps with a railing? : A Little 6 Click Score: 18    End of Session Equipment Utilized During Treatment: Gait belt Activity Tolerance: Patient tolerated treatment well;Patient limited by lethargy Patient left: in chair;with chair alarm set;with family/visitor present;Other (comment) (With towel roll and polar care in place) Nurse  Communication: Mobility status PT Visit Diagnosis: Muscle weakness (generalized) (M62.81);Difficulty in walking, not elsewhere classified (R26.2)     Time: 0921-1008 PT Time Calculation (min) (ACUTE ONLY): 47 min  Charges:  $Gait Training: 23-37 mins $Therapeutic Exercise: 8-22 mins                    G Codes:       Encarnacion Chu PT, DPT 01/01/2017, 10:31 AM

## 2017-01-01 NOTE — Care Management CC44 (Signed)
Condition Code 44 Documentation Completed  Patient Details  Name: Richard Castaneda MRN: [id number] Date of Birth: 1929/08/02   Condition Code 44 given:    Patient signature on Condition Code 44 notice:    Documentation of 2 MD's agreement:    Code 44 added to claim:       Marily MemosLisa M Beau Vanduzer, RN 01/01/2017, 9:17 AM

## 2017-01-01 NOTE — Care Management Note (Signed)
Case Management Note  Patient Details  Name: Larenz Frasier MRN: [id number] Date of Birth: [birth year]  Subjective/Objective:  Met with patient at bedside. He lives at home with his spouse. Discharging today. Offered choice of home health agencies and patient prefers [facility].  Referral sent to [facility] for HHPT. Patient has a walker. [facility] [phone]. Called Lovenox 40 mg # 14 no refills. PCP is  Dr. Netty Starring.                 Action/Plan: [facility] for HHPT, Lovenox called in. NO DME needs.   Expected Discharge Date:  01/01/17               Expected Discharge Plan:  [place]  In-House Referral:     Discharge planning Services  CM Consult  Post Acute Care Choice:  Home Health Choice offered to:  Patient  DME Arranged:    DME Agency:     HH Arranged:    [place] Agency:  [facility] (formerly [employer])  Status of Service:  Completed, signed off  If discussed at [employer], dates discussed:    Additional Comments:  Jolly Mango, RN 01/01/2017, 8:44 AM

## 2017-01-01 NOTE — Care Management Obs Status (Signed)
MEDICARE OBSERVATION STATUS NOTIFICATION   Patient Details  Name: Richard Castaneda MRN: [id number] Date of Birth: 1929/08/09   Medicare Observation Status Notification Given:  Yes    Marily MemosLisa M Rola Lennon, RN 01/01/2017, 9:17 AM

## 2017-01-07 ENCOUNTER — Telehealth: Payer: Self-pay | Admitting: Urology

## 2017-01-07 NOTE — Telephone Encounter (Signed)
Spoke with pt in reference to having OAB while on myrbetriq post knee surgery. Offered pt an appt to come in on the nurse schedule for a PVR. Pt declined stating "Im hoping it will clear up on its own." Offered to make pt an OV with Dr. Sherryl BartersBudzyn. Pt again declined. Reinforced with pt to call back if his symptoms dont get any better. Pt voiced understanding.

## 2017-01-07 NOTE — Telephone Encounter (Signed)
Pt called office stating that he saw Dr. Sherryl BartersBudzyn earlier this year, Dr. Sherryl BartersBudzyn started him on a "pill" that seemed to help him not urinate as much up until now.  Pt has questions and concerns, pt feels he is back to where he was before he started the "pill"  -- myrbetriq. Pt also adds that he has had a recent knee surgery and is wondering if that has had anything to do with his urinating frequently again, which started on Sunday. Please advise.  Thanks.

## 2017-01-10 NOTE — Progress Notes (Signed)
01/11/2017 8:54 AM   Richard Castaneda January 15, 1930 [id number]  Referring provider: Marisue Ivan, MD [phone] [facility] [address] [facility] [place], [place] [address]  Chief Complaint  Patient presents with  . Over Active Bladder    last seen 11/2016    HPI: 83 yo WM with BPH with LU TS, nocturia and OAB who presents today with worsening urinary symptoms after a knee surgery with her daughter, Richard Castaneda.    BPH WITH LUTS  (prostate and/or bladder) His IPSS score today is 13, which is moderate lower urinary tract symptomatology.  He is mixed with his quality life due to his urinary symptoms.  His PVR is 0 mL.  His UA is negative.  His major complaints today are frequency x q 2 hours and nocturia x 5.  He has had these symptoms for since his knee surgery on 12/31/2016.  He has tried to cut fluids after 5 pm, but he has not seen any improvement.  He has seen an increase in pedal edema since his surgery, but he states it is improving. He also had a Foley catheter placed during his knee replacement surgery.  He denies any dysuria, hematuria or suprapubic pain.   He currently taking Myrbetriq 25 mg and finasteride 5 mg daily.    His has had a cystoscopy on 11/26/2016 which showed an enlarged prostate Only 2-3 cm in length. Minimally obstructive.    He also denies any recent fevers, chills, nausea or vomiting.  Paternal uncle with prostate cancer.        IPSS    Row Name 11/16/16 1100 01/11/17 0800       International Prostate Symptom Score   How often have you had the sensation of not emptying your bladder? More than half the time Almost always    How often have you had to urinate less than every two hours? More than half the time Less than 1 in 5 times    How often have you found you stopped and started again several times when you urinated? About half the time Less than 1 in 5 times    How often have you found it difficult to postpone urination? Less than half the time Less  than 1 in 5 times    How often have you had a weak urinary stream? About half the time Less than 1 in 5 times    How often have you had to strain to start urination? Less than half the time Not at All    How many times did you typically get up at night to urinate? 4 Times 4 Times    Total IPSS Score 22 13      Quality of Life due to urinary symptoms   If you were to spend the rest of your life with your urinary condition just the way it is now how would you feel about that? Unhappy Mixed       Score:  1-7 Mild 8-19 Moderate 20-35 Severe  PMH: Past Medical History:  Diagnosis Date  . Acquired hypothyroidism 11/16/2016  . Anterior myocardial infarction (HCC) 12/29/2013   Overview:  2004  . Benign essential HTN 12/29/2013  . Benign prostatic hyperplasia with urinary frequency 10/19/2016  . Borderline diabetes mellitus 11/16/2016  . Coronary artery disease 12/29/2013   Overview:  Anterior MI, PCI and stent placement of LAD11/04  . H/O iron deficiency anemia 09/09/2015  . History of BPH   . Moderate tricuspid insufficiency 09/12/2014  . Occasional tremors  Left hand only with writing and eating  . Pre-diabetes   . Primary osteoarthritis of left knee 08/26/2016  . Pure hypercholesterolemia 12/29/2013  . PVD (peripheral vascular disease) (HCC) 12/29/2013   Overview:  With carotid atherosclerosis     Surgical History: Past Surgical History:  Procedure Laterality Date  . CORONARY ANGIOPLASTY    . EYE SURGERY Bilateral    Cataract Extraction with IOL  . HERNIA REPAIR Bilateral    Inguinal Hernia Repair  . PARTIAL KNEE ARTHROPLASTY Left 12/31/2016   Procedure: UNICOMPARTMENTAL KNEE;  Surgeon: Christena Flake, MD;  Location: [facility] ORS;  Service: Orthopedics;  Laterality: Left;    Home Medications:  Allergies as of 01/11/2017   No Known Allergies     Medication List       Accurate as of 01/11/17  8:54 AM. Always use your most recent med list.          acetaminophen 650 MG CR  tablet Commonly known as:  TYLENOL Take 650 mg by mouth 3 (three) times daily.   amLODipine 5 MG tablet Commonly known as:  NORVASC Take 5 mg by mouth daily.   aspirin EC 81 MG tablet Take 81 mg by mouth daily.   enoxaparin 40 MG/0.4ML injection Commonly known as:  LOVENOX Inject 0.4 mLs (40 mg total) into the skin daily.   finasteride 5 MG tablet Commonly known as:  PROSCAR TAKE 1 TABLET (5 MG TOTAL) BY MOUTH ONCE DAILY.   Fish Oil 1200 MG Caps Take 1,200 mg by mouth daily.   levothyroxine 137 MCG tablet Commonly known as:  SYNTHROID, LEVOTHROID TAKE 1 TABLET BY MOUTH ONCE DAILY ON EMPTY STOMACH WITH WATER 30-60 MIN BEFORE BREAKFAST   lisinopril 40 MG tablet Commonly known as:  PRINIVIL,ZESTRIL Take 40 mg by mouth daily.   mirabegron ER 25 MG Tb24 tablet Commonly known as:  MYRBETRIQ Take 1 tablet (25 mg total) by mouth daily.   OSTEO BI-FLEX JOINT SHIELD PO Take 1 tablet by mouth 2 (two) times daily.   oxyCODONE 5 MG immediate release tablet Commonly known as:  Oxy IR/ROXICODONE Take 1-2 tablets (5-10 mg total) by mouth every 4 (four) hours as needed for breakthrough pain.   simvastatin 20 MG tablet Commonly known as:  ZOCOR Take 20 mg by mouth at bedtime.            Discharge Care Instructions        Start     Ordered   01/11/17 0000  Urinalysis, Complete     01/11/17 0817   01/11/17 0000  BLADDER SCAN AMB NON-IMAGING     01/11/17 0818      Allergies: No Known Allergies  Family History: Family History  Problem Relation Age of Onset  . Kidney failure Mother   . Prostate cancer Paternal Uncle   . Bladder Cancer Neg Hx   . Kidney cancer Neg Hx     Social History:  reports that he has never smoked. He has never used smokeless tobacco. He reports that he does not drink alcohol or use drugs.  ROS: UROLOGY Frequent Urination?: Yes Hard to postpone urination?: No Burning/pain with urination?: No Get up at night to urinate?: Yes Leakage of  urine?: No Urine stream starts and stops?: No Trouble starting stream?: No Do you have to strain to urinate?: No Blood in urine?: No Urinary tract infection?: No Sexually transmitted disease?: No Injury to kidneys or bladder?: No Painful intercourse?: No Weak stream?: No Erection problems?: No Penile  pain?: No  Gastrointestinal Nausea?: No Vomiting?: No Indigestion/heartburn?: No Diarrhea?: No Constipation?: No  Constitutional Fever: No Night sweats?: No Weight loss?: No Fatigue?: No  Skin Skin rash/lesions?: No Itching?: No  Eyes Blurred vision?: No Double vision?: No  Ears/Nose/Throat Sore throat?: No Sinus problems?: No  Hematologic/Lymphatic Swollen glands?: No Easy bruising?: No  Cardiovascular Leg swelling?: No Chest pain?: No  Respiratory Cough?: No Shortness of breath?: No  Endocrine Excessive thirst?: No  Musculoskeletal Back pain?: No Joint pain?: No  Neurological Headaches?: No Dizziness?: No  Psychologic Depression?: No Anxiety?: No  Physical Exam: BP 132/68   Pulse 71   Ht 5\' 5"  (1.651 m)   Wt 150 lb (68 kg)   BMI 24.96 kg/m   Constitutional: Well nourished. Alert and oriented, No acute distress. HEENT: [place] AT, moist mucus membranes. Trachea midline, no masses. Cardiovascular: No clubbing, cyanosis, or edema. Respiratory: Normal respiratory effort, no increased work of breathing. Skin: No rashes, bruises or suspicious lesions. Lymph: No cervical or inguinal adenopathy. Neurologic: Grossly intact, no focal deficits, moving all 4 extremities. Psychiatric: Normal mood and affect.  Laboratory Data: Lab Results  Component Value Date   WBC 8.7 01/01/2017   HGB 11.7 (L) 01/01/2017   HCT 34.1 (L) 01/01/2017   MCV 97.6 01/01/2017   PLT 198 01/01/2017    Lab Results  Component Value Date   CREATININE 1.09 01/01/2017    Urinalysis Negative.  See EPIC.   I have reviewed the labs.  Results for Richard KickBARRETT, Bastian (MRN  [id number]) as of 01/11/2017 08:43  Ref. Range 01/11/2017 08:27  Scan Result Unknown 0    Pertinent Imaging:  I have independently reviewed the films.    Assessment & Plan:    1. BPH with LUTS  - IPSS score is 13/3  - Continue conservative management, avoiding bladder irritants and timed voiding's  - most bothersome symptoms is/are nocturia and frequency  - Continue finasteride 5 mg daily; refills given  - RTC in 3 months for I PSS and PVR  2. Nocturia  - Discussed with the patient that it may be too soon after his postoperative period to decide that the Myrbetriq is not working has he has had a Foley in place during his surgery and hospitalization, he is having pedal edema, reduced mobility  - We will try and increase of the Myrbetriq to 50 mg daily  3. OAB  - see above  Return in about 3 months (around 04/12/2017) for IPSS and PVR.  These notes generated with voice recognition software. I apologize for typographical errors.  Michiel CowboySHANNON Joanny Dupree, PA-C  [facility] [address], [address] [place], [place] [address] [phone]

## 2017-01-11 ENCOUNTER — Encounter: Payer: Self-pay | Admitting: Urology

## 2017-01-11 ENCOUNTER — Ambulatory Visit (INDEPENDENT_AMBULATORY_CARE_PROVIDER_SITE_OTHER): Payer: Medicare Other | Admitting: Urology

## 2017-01-11 VITALS — BP 132/68 | HR 71 | Ht 65.0 in | Wt 150.0 lb

## 2017-01-11 DIAGNOSIS — R351 Nocturia: Secondary | ICD-10-CM | POA: Diagnosis not present

## 2017-01-11 DIAGNOSIS — N401 Enlarged prostate with lower urinary tract symptoms: Secondary | ICD-10-CM | POA: Diagnosis not present

## 2017-01-11 DIAGNOSIS — N138 Other obstructive and reflux uropathy: Secondary | ICD-10-CM

## 2017-01-11 DIAGNOSIS — N3281 Overactive bladder: Secondary | ICD-10-CM | POA: Diagnosis not present

## 2017-01-11 LAB — URINALYSIS, COMPLETE
Bilirubin, UA: NEGATIVE
GLUCOSE, UA: NEGATIVE
NITRITE UA: NEGATIVE
Protein, UA: NEGATIVE
Specific Gravity, UA: 1.015 (ref 1.005–1.030)
Urobilinogen, Ur: 1 mg/dL (ref 0.2–1.0)
pH, UA: 6 (ref 5.0–7.5)

## 2017-01-11 LAB — BLADDER SCAN AMB NON-IMAGING: Scan Result: 0

## 2017-02-03 ENCOUNTER — Telehealth: Payer: Self-pay | Admitting: Urology

## 2017-02-25 ENCOUNTER — Ambulatory Visit: Payer: Medicare Other

## 2017-03-01 ENCOUNTER — Telehealth: Payer: Self-pay | Admitting: Urology

## 2017-03-01 DIAGNOSIS — N3281 Overactive bladder: Secondary | ICD-10-CM

## 2017-03-01 NOTE — Telephone Encounter (Signed)
Pt called office stating he is on his last box of Mybetriq and would like more.  [facility] [place]. Please advise. Thanks

## 2017-03-03 NOTE — Telephone Encounter (Signed)
Patient states that he is supposed to be on 50mg  of this not 25? Please advise and he is still waiting in this to be called in?   Richard DusterMichelle

## 2017-03-04 ENCOUNTER — Other Ambulatory Visit: Payer: Self-pay

## 2017-03-04 MED ORDER — MIRABEGRON ER 50 MG PO TB24: 50.0000 mg | ORAL_TABLET | Freq: Every day | ORAL | 11 refills | Status: DC

## 2017-03-04 NOTE — Progress Notes (Signed)
Per patient 50mg  Myrbetriq samples are working well for him and would like a script sent in for the 50 instead of the 25mg 

## 2017-03-05 MED ORDER — MIRABEGRON ER 50 MG PO TB24: 50.0000 mg | ORAL_TABLET | Freq: Every day | ORAL | 3 refills | Status: DC

## 2017-03-05 NOTE — Telephone Encounter (Signed)
Medication was sent to pharmacy.

## 2017-04-12 ENCOUNTER — Ambulatory Visit: Payer: Medicare Other | Admitting: Urology

## 2017-04-15 NOTE — Progress Notes (Signed)
04/19/2017 9:52 AM   Richard Castaneda 1930/02/29 [id number]  Referring provider: Marisue Ivan, MD [phone] [facility] [address] [facility] [place], [place] [address]  Chief Complaint  Patient presents with  . Benign Prostatic Hypertrophy    HPI: 83 yo WM with BPH with LU TS, nocturia and OAB who presents today for a 3 month follow up.    Patient presented on 01/11/2017 with worsening urinary symptoms after a knee surgery with her daughter, [name].    BPH WITH LUTS  (prostate and/or bladder) His IPSS score today is 13, which is moderate lower urinary tract symptomatology.  He is mostly satisfied with his quality life due to his urinary symptoms.  His PVR is 171 mL.  His BP is 126/72.   His I PSS score was 13/3.  His previous PVR was 0 mL.  His UA from 01/11/2017 negative.  His major complaints today are intermittency and nocturia.   Due to an increase in frequency and nocturia, we increased his Myrbetriq from 25 mg to 50 mg daily.  He had these symptoms for since his knee surgery on 12/31/2016.  He had tried to cut fluids after 5 pm, but he has not seen any improvement.   He had seen an increase in pedal edema since his surgery, but he states it is improving. He also had a Foley catheter placed during his knee replacement surgery.  He denies any dysuria, hematuria or suprapubic pain.   He currently taking Myrbetriq 50 mg and finasteride 5 mg daily.    His has had a cystoscopy on 11/26/2016 which showed an enlarged prostate Only 2-3 cm in length. Minimally obstructive.    He also denies any recent fevers, chills, nausea or vomiting.  Paternal uncle with prostate cancer.    IPSS    Row Name 04/19/17 0900         International Prostate Symptom Score   How often have you had the sensation of not emptying your bladder?  About half the time     How often have you had to urinate less than every two hours?  More than half the time     How often have you found you stopped and  started again several times when you urinated?  Less than half the time     How often have you found it difficult to postpone urination?  Not at All     How often have you had a weak urinary stream?  Less than 1 in 5 times     How often have you had to strain to start urination?  Less than 1 in 5 times     How many times did you typically get up at night to urinate?  2 Times     Total IPSS Score  13       Quality of Life due to urinary symptoms   If you were to spend the rest of your life with your urinary condition just the way it is now how would you feel about that?  Mostly Satisfied        Score:  1-7 Mild 8-19 Moderate 20-35 Severe  PMH: Past Medical History:  Diagnosis Date  . Acquired hypothyroidism 11/16/2016  . Anterior myocardial infarction (HCC) 12/29/2013   Overview:  2004  . Benign essential HTN 12/29/2013  . Benign prostatic hyperplasia with urinary frequency 10/19/2016  . Borderline diabetes mellitus 11/16/2016  . Coronary artery disease 12/29/2013   Overview:  Anterior MI, PCI and  stent placement of LAD11/04  . H/O iron deficiency anemia 09/09/2015  . History of BPH   . Moderate tricuspid insufficiency 09/12/2014  . Occasional tremors    Left hand only with writing and eating  . Pre-diabetes   . Primary osteoarthritis of left knee 08/26/2016  . Pure hypercholesterolemia 12/29/2013  . PVD (peripheral vascular disease) (HCC) 12/29/2013   Overview:  With carotid atherosclerosis     Surgical History: Past Surgical History:  Procedure Laterality Date  . CORONARY ANGIOPLASTY    . EYE SURGERY Bilateral    Cataract Extraction with IOL  . HERNIA REPAIR Bilateral    Inguinal Hernia Repair  . PARTIAL KNEE ARTHROPLASTY Left 12/31/2016   Procedure: UNICOMPARTMENTAL KNEE;  Surgeon: Christena FlakePoggi, John J, MD;  Location: [facility] ORS;  Service: Orthopedics;  Laterality: Left;    Home Medications:  Allergies as of 04/19/2017   No Known Allergies     Medication List        Accurate  as of 04/19/17  9:52 AM. Always use your most recent med list.          acetaminophen 650 MG CR tablet Commonly known as:  TYLENOL Take 650 mg by mouth 3 (three) times daily.   amLODipine 5 MG tablet Commonly known as:  NORVASC Take 5 mg by mouth daily.   aspirin EC 81 MG tablet Take 81 mg by mouth daily.   enoxaparin 40 MG/0.4ML injection Commonly known as:  LOVENOX Inject 0.4 mLs (40 mg total) into the skin daily.   finasteride 5 MG tablet Commonly known as:  PROSCAR TAKE 1 TABLET (5 MG TOTAL) BY MOUTH ONCE DAILY.   Fish Oil 1200 MG Caps Take 1,200 mg by mouth daily.   levothyroxine 137 MCG tablet Commonly known as:  SYNTHROID, LEVOTHROID TAKE 1 TABLET BY MOUTH ONCE DAILY ON EMPTY STOMACH WITH WATER 30-60 MIN BEFORE BREAKFAST   lisinopril 40 MG tablet Commonly known as:  PRINIVIL,ZESTRIL Take 40 mg by mouth daily.   mirabegron ER 50 MG Tb24 tablet Commonly known as:  MYRBETRIQ Take 1 tablet (50 mg total) by mouth daily.   OSTEO BI-FLEX JOINT SHIELD PO Take 1 tablet by mouth 2 (two) times daily.   simvastatin 20 MG tablet Commonly known as:  ZOCOR Take 20 mg by mouth at bedtime.   traMADol 50 MG tablet Commonly known as:  ULTRAM Take 1 tablet by mouth every 6 (six) hours.       Allergies: No Known Allergies  Family History: Family History  Problem Relation Age of Onset  . Kidney failure Mother   . Prostate cancer Paternal Uncle   . Bladder Cancer Neg Hx   . Kidney cancer Neg Hx     Social History:  reports that  has never smoked. he has never used smokeless tobacco. He reports that he does not drink alcohol or use drugs.  ROS: UROLOGY Frequent Urination?: No Hard to postpone urination?: No Burning/pain with urination?: No Get up at night to urinate?: Yes Leakage of urine?: No Urine stream starts and stops?: Yes Trouble starting stream?: No Do you have to strain to urinate?: No Blood in urine?: No Urinary tract infection?: No Sexually  transmitted disease?: No Injury to kidneys or bladder?: No Painful intercourse?: No Weak stream?: No Erection problems?: No Penile pain?: No  Gastrointestinal Nausea?: No Vomiting?: No Indigestion/heartburn?: No Diarrhea?: No Constipation?: No  Constitutional Fever: No Night sweats?: No Weight loss?: No Fatigue?: No  Skin Skin rash/lesions?: No Itching?: No  Eyes Blurred vision?: No Double vision?: No  Ears/Nose/Throat Sore throat?: No Sinus problems?: No  Hematologic/Lymphatic Swollen glands?: No Easy bruising?: Yes  Cardiovascular Leg swelling?: No Chest pain?: No  Respiratory Cough?: No Shortness of breath?: No  Endocrine Excessive thirst?: No  Musculoskeletal Back pain?: No Joint pain?: No  Neurological Headaches?: No Dizziness?: No  Psychologic Depression?: No Anxiety?: No  Physical Exam: BP 126/72   Pulse 65   Ht 5\' 5"  (1.651 m)   Wt 150 lb 6.4 oz (68.2 kg)   BMI 25.03 kg/m   Constitutional: Well nourished. Alert and oriented, No acute distress. HEENT: [place] AT, moist mucus membranes. Trachea midline, no masses. Cardiovascular: No clubbing, cyanosis, or edema. Respiratory: Normal respiratory effort, no increased work of breathing. Skin: No rashes, bruises or suspicious lesions. Lymph: No cervical or inguinal adenopathy. Neurologic: Grossly intact, no focal deficits, moving all 4 extremities. Psychiatric: Normal mood and affect.   Laboratory Data: Lab Results  Component Value Date   WBC 8.7 01/01/2017   HGB 11.7 (L) 01/01/2017   HCT 34.1 (L) 01/01/2017   MCV 97.6 01/01/2017   PLT 198 01/01/2017    Lab Results  Component Value Date   CREATININE 1.09 01/01/2017    I have reviewed the labs.   Pertinent Imaging: Results for Richard KickBARRETT, Sanjit (MRN [id number]) as of 04/19/2017 09:53  Ref. Range 04/19/2017 09:35  Scan Result Unknown 171ml     Assessment & Plan:    1. BPH with LUTS  - IPSS score is 13/2, it is  improved  - Continue conservative management, avoiding bladder irritants and timed voiding's  - most bothersome symptoms is/are nocturia x 2   - Continue finasteride 5 mg daily  - RTC in 12 months for I PSS, exam and PVR  2. Nocturia  - I explained to the patient that nocturia is often multi-factorial and difficult to treat.  Sleeping disorders, heart conditions, peripheral vascular disease, diabetes, an enlarged prostate for men, an urethral stricture causing bladder outlet obstruction and/or certain medications can contribute to nocturia.  - I have suggested that the patient avoid caffeine after noon and alcohol in the evening.  He or she may also benefit from fluid restrictions after 6:00 in the evening and voiding just prior to bedtime.  - I have explained that research studies have showed that over 84% of patients with sleep apnea reported frequent nighttime urination.   With sleep apnea, oxygen decreases, carbon dioxide increases, the blood become more acidic, the heart rate drops and blood vessels in the lung constrict.  The body is then alerted that something is very wrong. The sleeper must wake enough to reopen the airway. By this time, the heart is racing and experiences a false signal of fluid overload. The heart excretes a hormone-like protein that tells the body to get rid of sodium and water, resulting in nocturia.  -  I also informed the patient that a recent study noted that decreasing sodium intake to 2.3 grams daily, if they don't have issues with hyponatremia, can also reduce the number of nightly voids  - The patient may benefit from a discussion with his or her primary care physician to see if he or she has risk factors for sleep apnea or other sleep disturbances and obtaining a sleep study.  3. OAB  - see above  Return in about 1 year (around 04/19/2018) for IPSS, PVR and exam.  These notes generated with voice recognition software. I apologize for typographical  errors.  Zara Council, [facility] [address] [place], [place] [address] [phone]

## 2017-04-19 ENCOUNTER — Ambulatory Visit (INDEPENDENT_AMBULATORY_CARE_PROVIDER_SITE_OTHER): Payer: Medicare Other | Admitting: Urology

## 2017-04-19 ENCOUNTER — Encounter: Payer: Self-pay | Admitting: Urology

## 2017-04-19 VITALS — BP 126/72 | HR 65 | Ht 65.0 in | Wt 150.4 lb

## 2017-04-19 DIAGNOSIS — N138 Other obstructive and reflux uropathy: Secondary | ICD-10-CM

## 2017-04-19 DIAGNOSIS — N401 Enlarged prostate with lower urinary tract symptoms: Secondary | ICD-10-CM | POA: Diagnosis not present

## 2017-04-19 DIAGNOSIS — N3281 Overactive bladder: Secondary | ICD-10-CM

## 2017-04-19 DIAGNOSIS — R351 Nocturia: Secondary | ICD-10-CM | POA: Diagnosis not present

## 2017-04-19 LAB — BLADDER SCAN AMB NON-IMAGING

## 2017-06-08 ENCOUNTER — Other Ambulatory Visit: Payer: Self-pay | Admitting: Internal Medicine

## 2017-06-09 NOTE — Telephone Encounter (Signed)
Erroneous

## 2017-06-14 ENCOUNTER — Other Ambulatory Visit: Payer: Self-pay

## 2017-06-14 ENCOUNTER — Ambulatory Visit
Admission: RE | Admit: 2017-06-14 | Discharge: 2017-06-14 | Disposition: A | Discharge status: Home / Self Care | Payer: Medicare Other | Source: Ambulatory Visit | Attending: Surgery | Admitting: Surgery

## 2017-06-14 ENCOUNTER — Encounter
Admission: RE | Admit: 2017-06-14 | Discharge: 2017-06-14 | Disposition: A | Discharge status: Home / Self Care | Payer: Medicare Other | Source: Ambulatory Visit | Attending: Surgery | Admitting: Surgery

## 2017-06-14 DIAGNOSIS — E039 Hypothyroidism, unspecified: Secondary | ICD-10-CM | POA: Insufficient documentation

## 2017-06-14 DIAGNOSIS — Z01818 Encounter for other preprocedural examination: Secondary | ICD-10-CM

## 2017-06-14 DIAGNOSIS — E7800 Pure hypercholesterolemia, unspecified: Secondary | ICD-10-CM | POA: Insufficient documentation

## 2017-06-14 DIAGNOSIS — I739 Peripheral vascular disease, unspecified: Secondary | ICD-10-CM | POA: Diagnosis not present

## 2017-06-14 DIAGNOSIS — I2510 Atherosclerotic heart disease of native coronary artery without angina pectoris: Secondary | ICD-10-CM | POA: Insufficient documentation

## 2017-06-14 DIAGNOSIS — Z01810 Encounter for preprocedural cardiovascular examination: Secondary | ICD-10-CM | POA: Insufficient documentation

## 2017-06-14 DIAGNOSIS — R7303 Prediabetes: Secondary | ICD-10-CM | POA: Diagnosis not present

## 2017-06-14 DIAGNOSIS — Z96652 Presence of left artificial knee joint: Secondary | ICD-10-CM | POA: Diagnosis not present

## 2017-06-14 DIAGNOSIS — I252 Old myocardial infarction: Secondary | ICD-10-CM | POA: Insufficient documentation

## 2017-06-14 DIAGNOSIS — I10 Essential (primary) hypertension: Secondary | ICD-10-CM | POA: Diagnosis not present

## 2017-06-14 HISTORY — DX: Bilateral inguinal hernia, without obstruction or gangrene, not specified as recurrent: K40.20

## 2017-06-14 LAB — URINALYSIS, ROUTINE W REFLEX MICROSCOPIC
BILIRUBIN URINE: NEGATIVE
Glucose, UA: NEGATIVE mg/dL
Ketones, ur: NEGATIVE mg/dL
Nitrite: NEGATIVE
Protein, ur: NEGATIVE mg/dL
SPECIFIC GRAVITY, URINE: 1.016 (ref 1.005–1.030)
pH: 5 (ref 5.0–8.0)

## 2017-06-14 LAB — CBC
HEMATOCRIT: 39.7 % — AB (ref 40.0–52.0)
Hemoglobin: 13.5 g/dL (ref 13.0–18.0)
MCH: 32.7 pg (ref 26.0–34.0)
MCHC: 33.9 g/dL (ref 32.0–36.0)
MCV: 96.6 fL (ref 80.0–100.0)
Platelets: 243 10*3/uL (ref 150–440)
RBC: 4.11 MIL/uL — ABNORMAL LOW (ref 4.40–5.90)
RDW: 12.4 % (ref 11.5–14.5)
WBC: 6.9 10*3/uL (ref 3.8–10.6)

## 2017-06-14 LAB — TYPE AND SCREEN
ABO/RH(D): A POS
ANTIBODY SCREEN: NEGATIVE

## 2017-06-14 LAB — BASIC METABOLIC PANEL
ANION GAP: 7 (ref 5–15)
BUN: 29 mg/dL — ABNORMAL HIGH (ref 6–20)
CO2: 25 mmol/L (ref 22–32)
Calcium: 9.2 mg/dL (ref 8.9–10.3)
Chloride: 102 mmol/L (ref 101–111)
Creatinine, Ser: 1.15 mg/dL (ref 0.61–1.24)
GFR calc non Af Amer: 56 mL/min — ABNORMAL LOW (ref >60)
Glucose, Bld: 113 mg/dL — ABNORMAL HIGH (ref 65–99)
POTASSIUM: 4.5 mmol/L (ref 3.5–5.1)
Sodium: 134 mmol/L — ABNORMAL LOW (ref 135–145)

## 2017-06-14 LAB — TSH: TSH: 3.465 u[IU]/mL (ref 0.350–4.500)

## 2017-06-14 LAB — SURGICAL PCR SCREEN
MRSA, PCR: NEGATIVE
Staphylococcus aureus: NEGATIVE

## 2017-06-14 LAB — PROTIME-INR
INR: 0.99
PROTHROMBIN TIME: 13 s (ref 11.4–15.2)

## 2017-06-14 IMAGING — CR DG CHEST 2V
1 series · 2 of 2 positions shown · non-contrast
Comparison: [DATE]

CLINICAL DATA: Preoperative evaluation for upcoming knee
replacement

EXAM:
CHEST  2 VIEW

[Series 1: dg chest 2 view · 0.14mm/px · 2 of 2 slices shown]
[im 1/2]
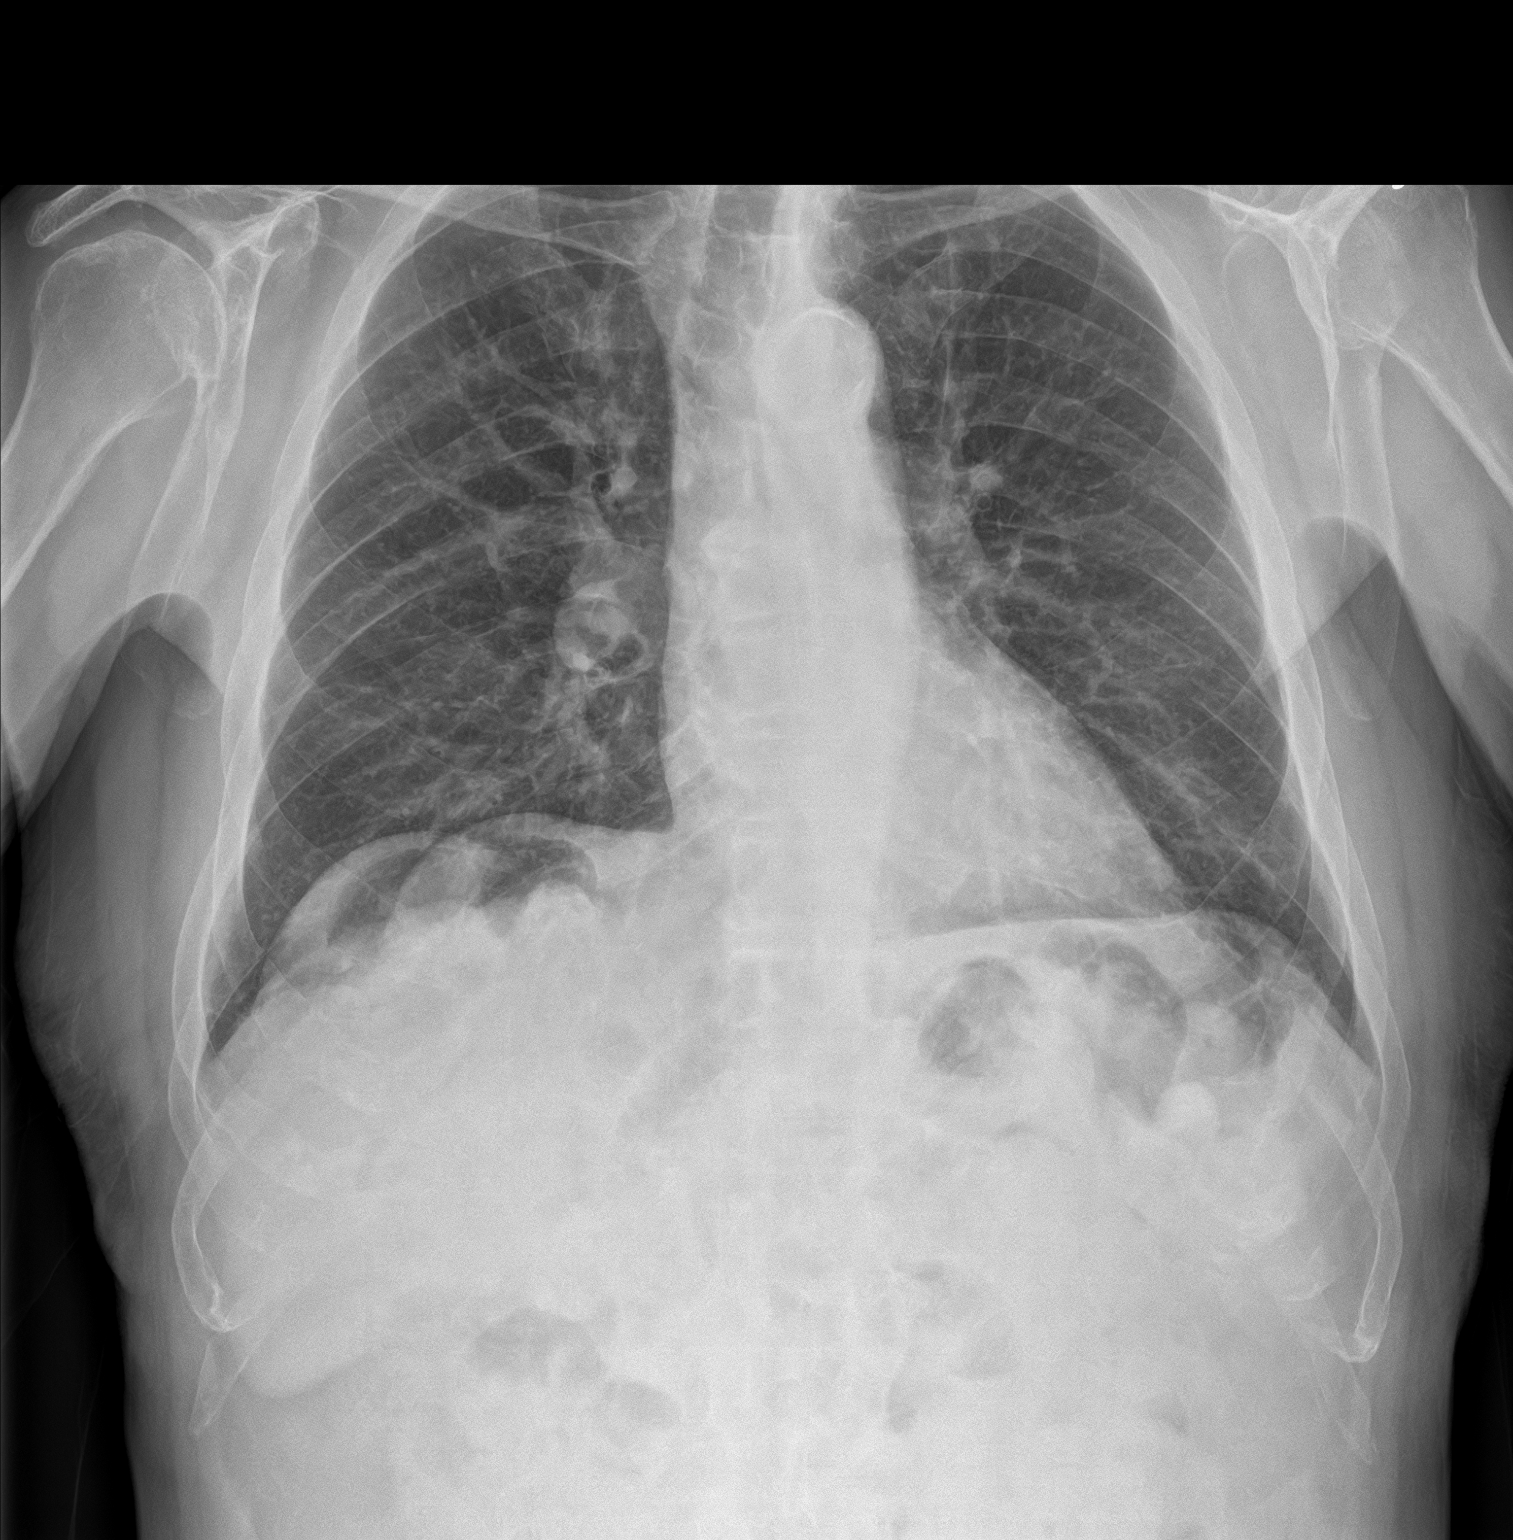
[im 2/2]
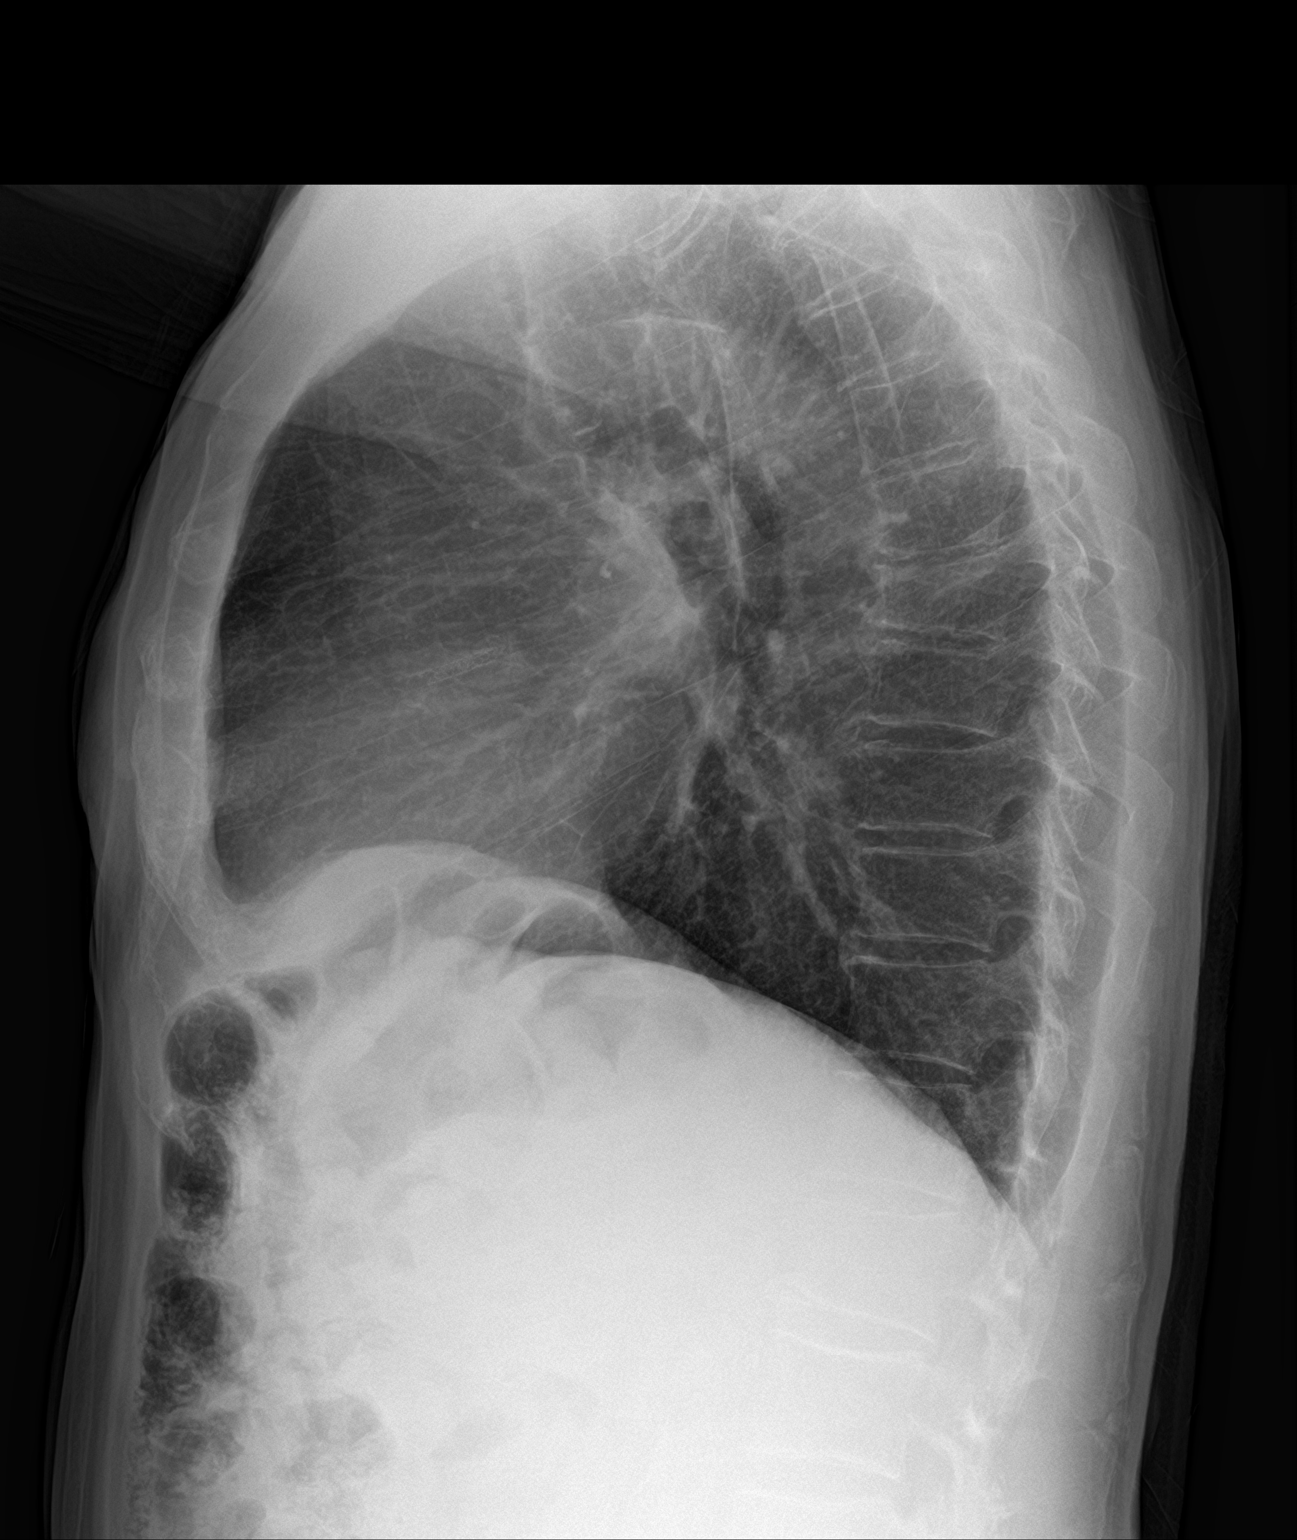

[2 of 2 positions shown; findings below may reference images not displayed]

FINDINGS: The heart size and mediastinal contours are within normal limits.
Both lungs are clear. The visualized skeletal structures are
unremarkable.
IMPRESSION: No active cardiopulmonary disease.

## 2017-06-14 NOTE — Pre-Procedure Instructions (Signed)
Patient stated that he was to have a TSH lab drawn today for Dr. Burnadette PopLinthavong and asked if it could be drawn at the same time as his other labwork in pre-admit testing. Dr. Sherryll BurgerLinthavong's office was called to verify this and was agreed that if the patient has the TSH drawn here at [facility] to fax the results to Dr. Burnadette PopLinthavong. The TSH level was drawn with his other pre-op labs and the results were faxed to Dr. Burnadette PopLinthavong as per request of the patient and Dr. Burnadette PopLinthavong.

## 2017-06-14 NOTE — Patient Instructions (Signed)
Your procedure is scheduled on: Tuesday, June 22, 2017 Report to Same Day Surgery on the 2nd floor in the [facility]. To find out your arrival time, please call [phone] between 1PM - 3PM on: Monday, June 21, 2017  REMEMBER: Instructions that are not followed completely may result in serious medical risk, up to and including death; or upon the discretion of your surgeon and anesthesiologist your surgery may need to be rescheduled.  Do not eat food after midnight the night before your procedure.  No gum chewing or hard candies.  You may however, drink CLEAR liquids up to 2 hours before you are scheduled to arrive at the hospital for your procedure.  Do not drink clear liquids within 2 hours of the start of your surgery.  Clear liquids include: - water  - apple juice without pulp - clear gatorade - black coffee or tea (Do NOT add anything to the coffee or tea) Do NOT drink anything that is not on this list.  No Alcohol for 24 hours before or after surgery.  No Smoking including e-cigarettes for 24 hours prior to surgery. No chewable tobacco products for at least 6 hours prior to surgery. No nicotine patches on the day of surgery.  On the morning of surgery brush your teeth with toothpaste and water, you may rinse your mouth with mouthwash if you wish. Do not swallow any  toothpaste of mouthwash.  Notify your doctor if there is any change in your medical condition (cold, fever, infection).  Do not wear jewelry, make-up, hairpins, clips or nail polish.  Do not wear lotions, powders, or perfumes. You may wear deodorant.  Do not shave legs 48 hours prior to surgery. Men may shave face and neck.  Contacts and dentures may not be worn into surgery.  Do not bring valuables to the hospital. [facility] is not responsible for any belongings or valuables.  TAKE THESE MEDICATIONS THE MORNING OF SURGERY WITH A SIP OF WATER:  1.  Amlodipine 2.  Levothyroxine 3.  Tramadol  (if needed for pain)  Use CHG Soap as directed on instruction sheet.  Follow recommendations from Cardiologist regarding stopping Aspirin.  NOW!  Stop Anti-inflammatories such as Advil, Aleve, Ibuprofen, Motrin, Naproxen, Naprosyn, Goodie powder, or aspirin products. (May take Tylenol or Acetaminophen if needed.)  NOW!  Stop ANY OVER THE COUNTER supplements until after surgery. (FISH OIL, OSTEO-BIFLEX)  If you are being admitted to the hospital overnight, leave your suitcase in the car. After surgery it may be brought to your room.  Please call the number above if you have any questions about these instructions.

## 2017-06-21 MED ORDER — CEFAZOLIN SODIUM-DEXTROSE 2-4 GM/100ML-% IV SOLN
2.0000 g | Freq: Once | INTRAVENOUS | Status: AC
  Administered 2017-06-22: 2 g via INTRAVENOUS

## 2017-06-22 ENCOUNTER — Inpatient Hospital Stay
Admission: RE | Admit: 2017-06-22 | Discharge: 2017-06-24 | DRG: 468 | Disposition: A | Discharge status: Home Health | Payer: Medicare Other | Source: Ambulatory Visit | Attending: Surgery | Admitting: Surgery

## 2017-06-22 ENCOUNTER — Inpatient Hospital Stay: Payer: Medicare Other | Admitting: Anesthesiology

## 2017-06-22 ENCOUNTER — Other Ambulatory Visit: Payer: Self-pay

## 2017-06-22 ENCOUNTER — Encounter
Admission: RE | Disposition: A | Discharge status: Home Health | Payer: Self-pay | Source: Ambulatory Visit | Attending: Surgery

## 2017-06-22 ENCOUNTER — Encounter: Payer: Self-pay | Admitting: Anesthesiology

## 2017-06-22 ENCOUNTER — Inpatient Hospital Stay: Payer: Medicare Other

## 2017-06-22 DIAGNOSIS — Z79891 Long term (current) use of opiate analgesic: Secondary | ICD-10-CM | POA: Diagnosis not present

## 2017-06-22 DIAGNOSIS — Z955 Presence of coronary angioplasty implant and graft: Secondary | ICD-10-CM | POA: Diagnosis not present

## 2017-06-22 DIAGNOSIS — I2510 Atherosclerotic heart disease of native coronary artery without angina pectoris: Secondary | ICD-10-CM | POA: Diagnosis present

## 2017-06-22 DIAGNOSIS — Z79899 Other long term (current) drug therapy: Secondary | ICD-10-CM | POA: Diagnosis not present

## 2017-06-22 DIAGNOSIS — T84033A Mechanical loosening of internal left knee prosthetic joint, initial encounter: Principal | ICD-10-CM | POA: Diagnosis present

## 2017-06-22 DIAGNOSIS — Z79890 Hormone replacement therapy (postmenopausal): Secondary | ICD-10-CM | POA: Diagnosis not present

## 2017-06-22 DIAGNOSIS — I252 Old myocardial infarction: Secondary | ICD-10-CM | POA: Diagnosis not present

## 2017-06-22 DIAGNOSIS — I739 Peripheral vascular disease, unspecified: Secondary | ICD-10-CM | POA: Diagnosis present

## 2017-06-22 DIAGNOSIS — I10 Essential (primary) hypertension: Secondary | ICD-10-CM | POA: Diagnosis present

## 2017-06-22 DIAGNOSIS — Z7951 Long term (current) use of inhaled steroids: Secondary | ICD-10-CM

## 2017-06-22 DIAGNOSIS — N400 Enlarged prostate without lower urinary tract symptoms: Secondary | ICD-10-CM | POA: Diagnosis present

## 2017-06-22 DIAGNOSIS — R509 Fever, unspecified: Secondary | ICD-10-CM | POA: Diagnosis present

## 2017-06-22 DIAGNOSIS — R350 Frequency of micturition: Secondary | ICD-10-CM | POA: Diagnosis present

## 2017-06-22 DIAGNOSIS — E7800 Pure hypercholesterolemia, unspecified: Secondary | ICD-10-CM | POA: Diagnosis present

## 2017-06-22 DIAGNOSIS — R7303 Prediabetes: Secondary | ICD-10-CM | POA: Diagnosis present

## 2017-06-22 DIAGNOSIS — Y838 Other surgical procedures as the cause of abnormal reaction of the patient, or of later complication, without mention of misadventure at the time of the procedure: Secondary | ICD-10-CM | POA: Diagnosis present

## 2017-06-22 DIAGNOSIS — I361 Nonrheumatic tricuspid (valve) insufficiency: Secondary | ICD-10-CM | POA: Diagnosis present

## 2017-06-22 DIAGNOSIS — Z961 Presence of intraocular lens: Secondary | ICD-10-CM | POA: Diagnosis present

## 2017-06-22 DIAGNOSIS — Z96652 Presence of left artificial knee joint: Secondary | ICD-10-CM

## 2017-06-22 DIAGNOSIS — Z7982 Long term (current) use of aspirin: Secondary | ICD-10-CM

## 2017-06-22 HISTORY — PX: TOTAL KNEE REVISION: SHX996

## 2017-06-22 LAB — GLUCOSE, CAPILLARY
GLUCOSE-CAPILLARY: 103 mg/dL — AB (ref 65–99)
Glucose-Capillary: 99 mg/dL (ref 65–99)

## 2017-06-22 IMAGING — DX DG KNEE 1-2V PORT*L*
2 series · 2 of 2 positions shown · non-contrast
Comparison: [DATE] plain film exam.

CLINICAL DATA: 87-year-old male post right knee replacement.
Initial encounter.

EXAM:
PORTABLE LEFT KNEE - 1-2 VIEW

[knee ap]
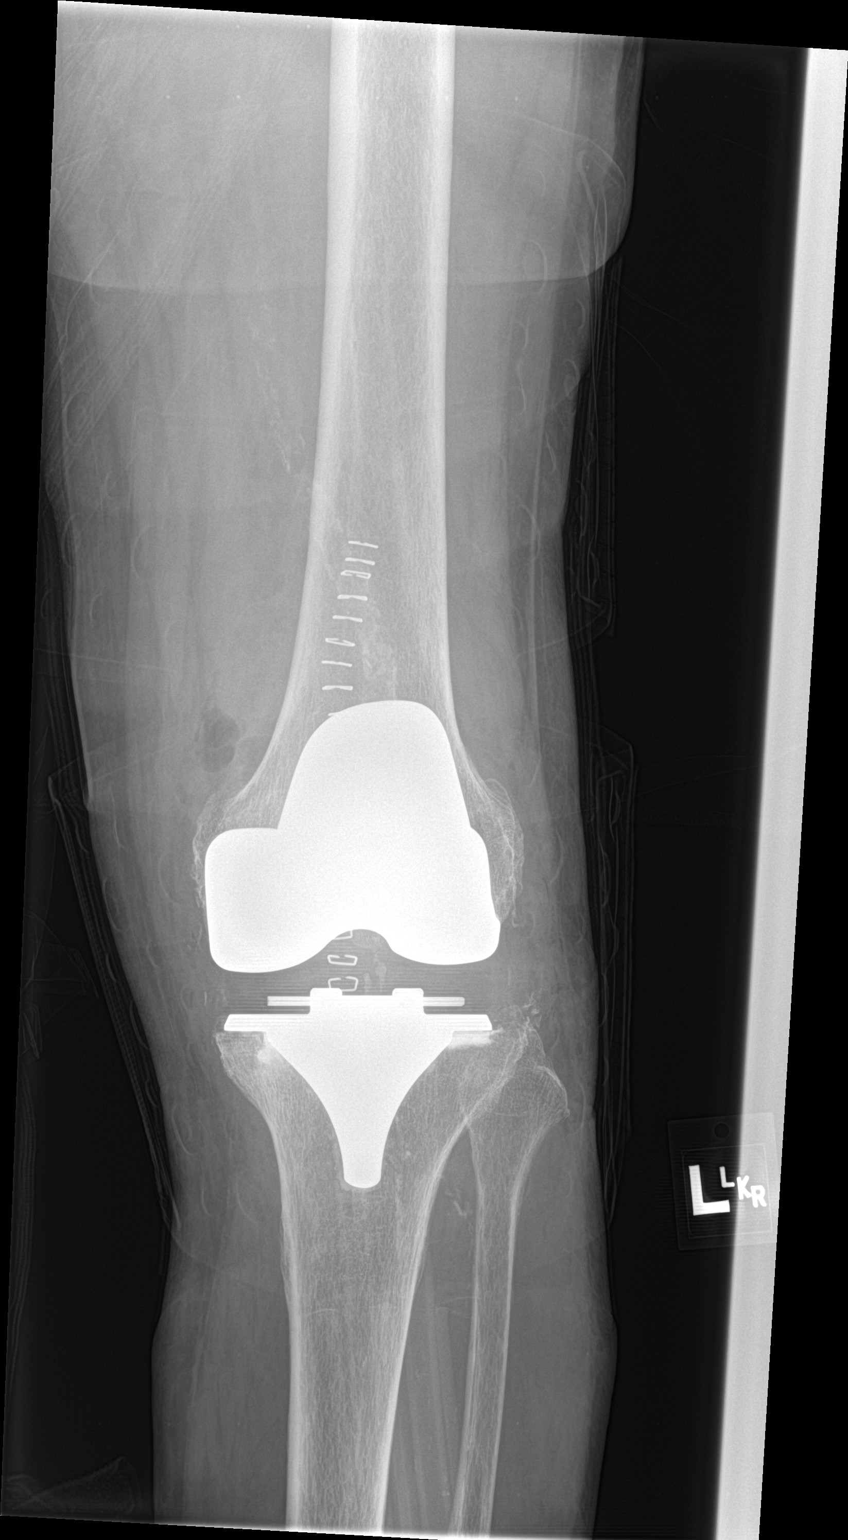

[knee lat]
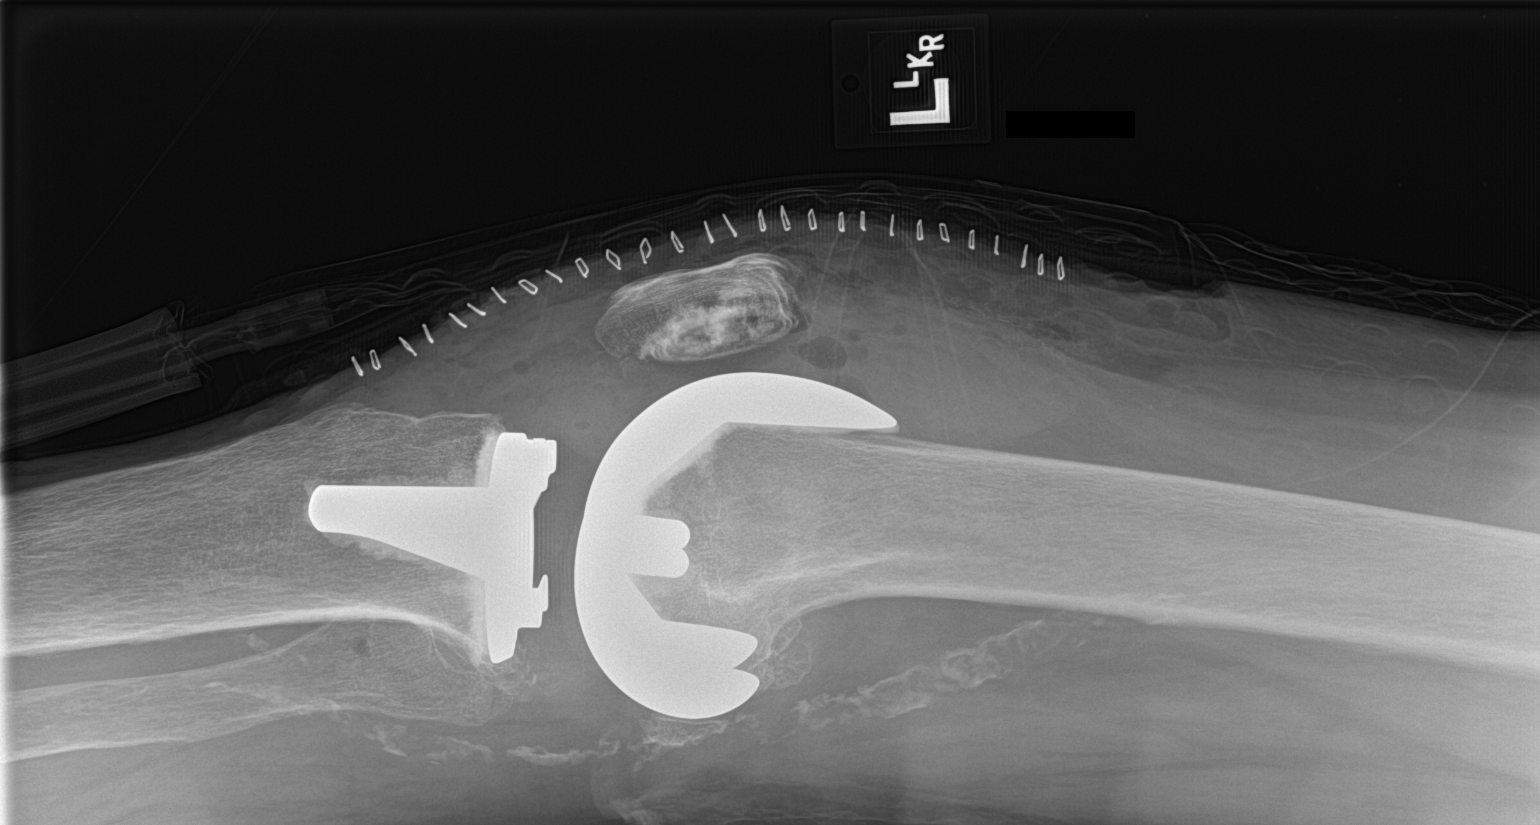

[2 of 2 positions shown; findings below may reference images not displayed]

FINDINGS: Post total left knee replacement which appears in satisfactory
position without complication noted.

Vascular calcifications.
IMPRESSION: Post total left knee replacement in satisfactory position.

## 2017-06-22 SURGERY — TOTAL KNEE REVISION
Anesthesia: Spinal | Site: Knee | Laterality: Left | Wound class: Clean

## 2017-06-22 MED ORDER — SODIUM CHLORIDE 0.9 % IV SOLN
INTRAVENOUS | Status: AC | PRN
  Administered 2017-06-22: 1000 mg via TOPICAL

## 2017-06-22 MED ORDER — ASPIRIN EC 81 MG PO TBEC
81.0000 mg | DELAYED_RELEASE_TABLET | Freq: Every day | ORAL | Status: DC
  Administered 2017-06-23 – 2017-06-24 (×2): 81 mg via ORAL
  Filled 2017-06-22 (×2): qty 1

## 2017-06-22 MED ORDER — PANTOPRAZOLE SODIUM 40 MG PO TBEC
40.0000 mg | DELAYED_RELEASE_TABLET | Freq: Every day | ORAL | Status: DC
  Administered 2017-06-23 – 2017-06-24 (×2): 40 mg via ORAL
  Filled 2017-06-22 (×3): qty 1

## 2017-06-22 MED ORDER — SODIUM CHLORIDE 0.9 % IV SOLN
INTRAVENOUS | Status: DC | PRN
  Administered 2017-06-22: 25 ug/min via INTRAVENOUS

## 2017-06-22 MED ORDER — POTASSIUM CHLORIDE IN NACL 20-0.9 MEQ/L-% IV SOLN
INTRAVENOUS | Status: DC
  Administered 2017-06-22: 19:00:00 via INTRAVENOUS
  Filled 2017-06-22 (×5): qty 1000

## 2017-06-22 MED ORDER — OMEGA-3-ACID ETHYL ESTERS 1 G PO CAPS
1.0000 g | ORAL_CAPSULE | Freq: Every day | ORAL | Status: DC
  Filled 2017-06-22 (×2): qty 1

## 2017-06-22 MED ORDER — PROPOFOL 500 MG/50ML IV EMUL
INTRAVENOUS | Status: DC | PRN
  Administered 2017-06-22: 40 ug/kg/min via INTRAVENOUS

## 2017-06-22 MED ORDER — BUPIVACAINE LIPOSOME 1.3 % IJ SUSP
INTRAMUSCULAR | Status: AC
  Filled 2017-06-22: qty 20

## 2017-06-22 MED ORDER — AMLODIPINE BESYLATE 5 MG PO TABS
5.0000 mg | ORAL_TABLET | Freq: Every day | ORAL | Status: DC
  Administered 2017-06-23 – 2017-06-24 (×2): 5 mg via ORAL
  Filled 2017-06-22 (×2): qty 1

## 2017-06-22 MED ORDER — ONDANSETRON HCL 4 MG PO TABS: 4.0000 mg | ORAL_TABLET | Freq: Four times a day (QID) | ORAL | Status: DC | PRN

## 2017-06-22 MED ORDER — FERROUS SULFATE 325 (65 FE) MG PO TABS
325.0000 mg | ORAL_TABLET | Freq: Every day | ORAL | Status: DC
  Filled 2017-06-22 (×3): qty 1

## 2017-06-22 MED ORDER — BUPIVACAINE-EPINEPHRINE (PF) 0.5% -1:200000 IJ SOLN
INTRAMUSCULAR | Status: AC
  Filled 2017-06-22: qty 30

## 2017-06-22 MED ORDER — NAPHAZOLINE-GLYCERIN 0.012-0.2 % OP SOLN
1.0000 [drp] | Freq: Four times a day (QID) | OPHTHALMIC | Status: DC | PRN
  Filled 2017-06-22: qty 15

## 2017-06-22 MED ORDER — PROPOFOL 500 MG/50ML IV EMUL
INTRAVENOUS | Status: AC
  Filled 2017-06-22: qty 50

## 2017-06-22 MED ORDER — HYDRALAZINE HCL 20 MG/ML IJ SOLN: 10.0000 mg | Freq: Four times a day (QID) | INTRAMUSCULAR | Status: DC | PRN

## 2017-06-22 MED ORDER — FLEET ENEMA 7-19 GM/118ML RE ENEM: 1.0000 | ENEMA | Freq: Once | RECTAL | Status: DC | PRN

## 2017-06-22 MED ORDER — CEFAZOLIN SODIUM-DEXTROSE 2-4 GM/100ML-% IV SOLN
INTRAVENOUS | Status: AC
  Filled 2017-06-22: qty 100

## 2017-06-22 MED ORDER — SODIUM CHLORIDE 0.9 % IJ SOLN
INTRAMUSCULAR | Status: AC
  Filled 2017-06-22: qty 50

## 2017-06-22 MED ORDER — FAMOTIDINE 20 MG PO TABS
ORAL_TABLET | ORAL | Status: AC
  Filled 2017-06-22: qty 1

## 2017-06-22 MED ORDER — BUPIVACAINE HCL (PF) 0.5 % IJ SOLN
INTRAMUSCULAR | Status: DC | PRN
  Administered 2017-06-22: 3 mL

## 2017-06-22 MED ORDER — SODIUM CHLORIDE 0.9 % IV SOLN
INTRAVENOUS | Status: DC
  Administered 2017-06-22 (×2): via INTRAVENOUS

## 2017-06-22 MED ORDER — ACETAMINOPHEN 500 MG PO TABS
1000.0000 mg | ORAL_TABLET | Freq: Four times a day (QID) | ORAL | Status: AC
  Administered 2017-06-22 – 2017-06-23 (×4): 1000 mg via ORAL
  Filled 2017-06-22 (×4): qty 2

## 2017-06-22 MED ORDER — CHOLECALCIFEROL 10 MCG (400 UNIT) PO TABS
400.0000 [IU] | ORAL_TABLET | Freq: Every day | ORAL | Status: DC
  Filled 2017-06-22 (×3): qty 1

## 2017-06-22 MED ORDER — NEOMYCIN-POLYMYXIN B GU 40-200000 IR SOLN
Status: AC
  Filled 2017-06-22: qty 20

## 2017-06-22 MED ORDER — BUPIVACAINE LIPOSOME 1.3 % IJ SUSP
INTRAMUSCULAR | Status: DC | PRN
  Administered 2017-06-22: 60 mL

## 2017-06-22 MED ORDER — OXYCODONE HCL 5 MG PO TABS
10.0000 mg | ORAL_TABLET | ORAL | Status: DC | PRN
  Administered 2017-06-24: 10 mg via ORAL
  Filled 2017-06-22: qty 2

## 2017-06-22 MED ORDER — BUPIVACAINE-EPINEPHRINE (PF) 0.25% -1:200000 IJ SOLN
INTRAMUSCULAR | Status: DC | PRN
  Administered 2017-06-22: 30 mL

## 2017-06-22 MED ORDER — DIPHENHYDRAMINE HCL 12.5 MG/5ML PO ELIX
12.5000 mg | ORAL_SOLUTION | ORAL | Status: DC | PRN
  Administered 2017-06-22: 12.5 mg via ORAL
  Filled 2017-06-22: qty 5

## 2017-06-22 MED ORDER — METOCLOPRAMIDE HCL 10 MG PO TABS: 5.0000 mg | ORAL_TABLET | Freq: Three times a day (TID) | ORAL | Status: DC | PRN

## 2017-06-22 MED ORDER — ONDANSETRON HCL 4 MG/2ML IJ SOLN: 4.0000 mg | Freq: Four times a day (QID) | INTRAMUSCULAR | Status: DC | PRN

## 2017-06-22 MED ORDER — CEFAZOLIN SODIUM-DEXTROSE 2-4 GM/100ML-% IV SOLN
2.0000 g | Freq: Four times a day (QID) | INTRAVENOUS | Status: AC
  Administered 2017-06-22 – 2017-06-23 (×3): 2 g via INTRAVENOUS
  Filled 2017-06-22 (×3): qty 100

## 2017-06-22 MED ORDER — ONDANSETRON HCL 4 MG/2ML IJ SOLN: 4.0000 mg | Freq: Once | INTRAMUSCULAR | Status: DC | PRN

## 2017-06-22 MED ORDER — TRANEXAMIC ACID 1000 MG/10ML IV SOLN
INTRAVENOUS | Status: AC
  Filled 2017-06-22: qty 10

## 2017-06-22 MED ORDER — OSTEO BI-FLEX ADV TRIPLE ST PO TABS: 1.0000 | ORAL_TABLET | Freq: Two times a day (BID) | ORAL | Status: DC

## 2017-06-22 MED ORDER — NEOMYCIN-POLYMYXIN B GU 40-200000 IR SOLN
Status: DC | PRN
  Administered 2017-06-22: 14 mL

## 2017-06-22 MED ORDER — ACETAMINOPHEN 650 MG RE SUPP: 650.0000 mg | RECTAL | Status: DC | PRN

## 2017-06-22 MED ORDER — ACETAMINOPHEN 10 MG/ML IV SOLN
INTRAVENOUS | Status: AC
  Filled 2017-06-22: qty 100

## 2017-06-22 MED ORDER — DOCUSATE SODIUM 100 MG PO CAPS
100.0000 mg | ORAL_CAPSULE | Freq: Two times a day (BID) | ORAL | Status: DC
  Administered 2017-06-22 – 2017-06-24 (×3): 100 mg via ORAL
  Filled 2017-06-22 (×3): qty 1

## 2017-06-22 MED ORDER — FLUTICASONE PROPIONATE 50 MCG/ACT NA SUSP
1.0000 | Freq: Every day | NASAL | Status: DC | PRN
Start: 2017-06-22 — End: 2017-06-24
  Filled 2017-06-22: qty 16

## 2017-06-22 MED ORDER — ENOXAPARIN SODIUM 40 MG/0.4ML ~~LOC~~ SOLN
40.0000 mg | SUBCUTANEOUS | Status: DC
  Administered 2017-06-23 – 2017-06-24 (×2): 40 mg via SUBCUTANEOUS
  Filled 2017-06-22 (×2): qty 0.4

## 2017-06-22 MED ORDER — METOCLOPRAMIDE HCL 5 MG/ML IJ SOLN: 5.0000 mg | Freq: Three times a day (TID) | INTRAMUSCULAR | Status: DC | PRN

## 2017-06-22 MED ORDER — MAGNESIUM HYDROXIDE 400 MG/5ML PO SUSP
30.0000 mL | Freq: Every day | ORAL | Status: DC | PRN
  Administered 2017-06-23 – 2017-06-24 (×2): 30 mL via ORAL
  Filled 2017-06-22 (×2): qty 30

## 2017-06-22 MED ORDER — OXYCODONE HCL 5 MG PO TABS
5.0000 mg | ORAL_TABLET | ORAL | Status: DC | PRN
  Administered 2017-06-22 – 2017-06-24 (×6): 5 mg via ORAL
  Filled 2017-06-22 (×6): qty 1

## 2017-06-22 MED ORDER — LEVOTHYROXINE SODIUM 50 MCG PO TABS
150.0000 ug | ORAL_TABLET | Freq: Every day | ORAL | Status: DC
  Administered 2017-06-23 – 2017-06-24 (×2): 150 ug via ORAL
  Filled 2017-06-22 (×3): qty 1

## 2017-06-22 MED ORDER — BISACODYL 10 MG RE SUPP
10.0000 mg | Freq: Every day | RECTAL | Status: DC | PRN
  Administered 2017-06-24: 10 mg via RECTAL
  Filled 2017-06-22: qty 1

## 2017-06-22 MED ORDER — MORPHINE SULFATE (PF) 2 MG/ML IV SOLN: 1.0000 mg | INTRAVENOUS | Status: DC | PRN

## 2017-06-22 MED ORDER — FINASTERIDE 5 MG PO TABS
5.0000 mg | ORAL_TABLET | Freq: Every day | ORAL | Status: DC
  Administered 2017-06-23 – 2017-06-24 (×2): 5 mg via ORAL
  Filled 2017-06-22 (×3): qty 1

## 2017-06-22 MED ORDER — ACETAMINOPHEN 10 MG/ML IV SOLN
INTRAVENOUS | Status: DC | PRN
  Administered 2017-06-22: 1000 mg via INTRAVENOUS

## 2017-06-22 MED ORDER — MIRABEGRON ER 50 MG PO TB24
50.0000 mg | ORAL_TABLET | Freq: Every day | ORAL | Status: DC
  Administered 2017-06-22 – 2017-06-23 (×2): 50 mg via ORAL
  Filled 2017-06-22 (×3): qty 1

## 2017-06-22 MED ORDER — LISINOPRIL 20 MG PO TABS: 40.0000 mg | ORAL_TABLET | Freq: Every day | ORAL | Status: DC

## 2017-06-22 MED ORDER — FENTANYL CITRATE (PF) 100 MCG/2ML IJ SOLN: 25.0000 ug | INTRAMUSCULAR | Status: DC | PRN

## 2017-06-22 MED ORDER — SIMVASTATIN 20 MG PO TABS
20.0000 mg | ORAL_TABLET | Freq: Every day | ORAL | Status: DC
  Administered 2017-06-22 – 2017-06-23 (×2): 20 mg via ORAL
  Filled 2017-06-22 (×2): qty 1

## 2017-06-22 MED ORDER — LISINOPRIL 20 MG PO TABS
40.0000 mg | ORAL_TABLET | Freq: Every day | ORAL | Status: DC
  Administered 2017-06-23 – 2017-06-24 (×2): 40 mg via ORAL
  Filled 2017-06-22 (×3): qty 2

## 2017-06-22 MED ORDER — FAMOTIDINE 20 MG PO TABS
20.0000 mg | ORAL_TABLET | Freq: Once | ORAL | Status: AC
  Administered 2017-06-22: 20 mg via ORAL

## 2017-06-22 MED ORDER — ACETAMINOPHEN 325 MG PO TABS
650.0000 mg | ORAL_TABLET | ORAL | Status: DC | PRN
  Administered 2017-06-24: 650 mg via ORAL
  Filled 2017-06-22: qty 2

## 2017-06-22 SURGICAL SUPPLY — 63 items
AUTOTRANSFUS HAS 1/8 (MISCELLANEOUS) ×3
BANDAGE ACE 4X5 VEL STRL LF (GAUZE/BANDAGES/DRESSINGS) ×3 IMPLANT
BLADE SAGITTAL AGGR TOOTH XLG (BLADE) ×3 IMPLANT
BLADE SAW SAG 29X58X.64 (BLADE) ×3 IMPLANT
BNDG COHESIVE 6X5 TAN STRL LF (GAUZE/BANDAGES/DRESSINGS) ×3 IMPLANT
BOWL CEMENT MIXING ADV NOZZLE (MISCELLANEOUS) ×3 IMPLANT
CANISTER SUCT 1200ML W/VALVE (MISCELLANEOUS) ×3 IMPLANT
CANISTER SUCT 3000ML PPV (MISCELLANEOUS) ×6 IMPLANT
CEMENT BONE R 1X40 (Cement) ×6 IMPLANT
CHLORAPREP W/TINT 26ML (MISCELLANEOUS) ×6 IMPLANT
COOLER POLAR GLACIER W/PUMP (MISCELLANEOUS) ×3 IMPLANT
DRAPE IMP U-DRAPE 54X76 (DRAPES) ×6 IMPLANT
DRAPE INCISE IOBAN 66X45 STRL (DRAPES) ×3 IMPLANT
DRAPE SURG 17X11 SM STRL (DRAPES) ×6 IMPLANT
DRSG OPSITE POSTOP 4X10 (GAUZE/BANDAGES/DRESSINGS) ×3 IMPLANT
ELECT CAUTERY BLADE 6.4 (BLADE) ×3 IMPLANT
ELECT REM PT RETURN 9FT ADLT (ELECTROSURGICAL) ×3
ELECTRODE REM PT RTRN 9FT ADLT (ELECTROSURGICAL) ×1 IMPLANT
EVACUATOR 1/8 PVC DRAIN (DRAIN) ×3 IMPLANT
FEMORAL CR LEFT  70MM (Joint) ×2 IMPLANT
FEMORAL CR LEFT 70MM (Joint) ×1 IMPLANT
FLEXIBLE OSTEOTOME FROM MORELAND   CEMENTLESS ×3 IMPLANT
GAUZE PETRO XEROFOAM 1X8 (MISCELLANEOUS) ×6 IMPLANT
GAUZE SPONGE 4X4 12PLY STRL (GAUZE/BANDAGES/DRESSINGS) ×3 IMPLANT
GLOVE BIO SURGEON STRL SZ8 (GLOVE) ×6 IMPLANT
GLOVE BIOGEL M 7.0 STRL (GLOVE) ×6 IMPLANT
GLOVE BIOGEL PI IND STRL 7.0 (GLOVE) ×6 IMPLANT
GLOVE BIOGEL PI IND STRL 7.5 (GLOVE) ×1 IMPLANT
GLOVE BIOGEL PI INDICATOR 7.0 (GLOVE) ×12
GLOVE BIOGEL PI INDICATOR 7.5 (GLOVE) ×2
GLOVE INDICATOR 8.0 STRL GRN (GLOVE) ×3 IMPLANT
GOWN STRL REUS W/ TWL LRG LVL3 (GOWN DISPOSABLE) ×3 IMPLANT
GOWN STRL REUS W/ TWL XL LVL3 (GOWN DISPOSABLE) ×1 IMPLANT
GOWN STRL REUS W/TWL LRG LVL3 (GOWN DISPOSABLE) ×6
GOWN STRL REUS W/TWL XL LVL3 (GOWN DISPOSABLE) ×2
HOOD PEEL AWAY FLYTE STAYCOOL (MISCELLANEOUS) ×12 IMPLANT
IMMBOLIZER KNEE 19 BLUE UNIV (SOFTGOODS) ×3 IMPLANT
KIT TURNOVER KIT A (KITS) ×3 IMPLANT
KNEE TIBIAL BEAR 67 14THK (Knees) ×3 IMPLANT
NDL SAFETY ECLIPSE 18X1.5 (NEEDLE) ×2 IMPLANT
NEEDLE HYPO 18GX1.5 SHARP (NEEDLE) ×4
NEEDLE SPNL 20GX3.5 QUINCKE YW (NEEDLE) ×3 IMPLANT
NS IRRIG 1000ML POUR BTL (IV SOLUTION) ×3 IMPLANT
OSTEOTOME THIN 10.0 1.5 (INSTRUMENTS) ×3 IMPLANT
PACK TOTAL KNEE (MISCELLANEOUS) ×3 IMPLANT
PAD WRAPON POLAR KNEE (MISCELLANEOUS) ×1 IMPLANT
PATELLA STD 34X8.5 (Orthopedic Implant) ×3 IMPLANT
PLATE INTERLOK 6700 (Plate) ×3 IMPLANT
PULSAVAC PLUS IRRIG FAN TIP (DISPOSABLE) ×3
SOL .9 NS 3000ML IRR  AL (IV SOLUTION) ×2
SOL .9 NS 3000ML IRR UROMATIC (IV SOLUTION) ×1 IMPLANT
STAPLER SKIN PROX 35W (STAPLE) ×3 IMPLANT
SUCTION FRAZIER HANDLE 10FR (MISCELLANEOUS) ×2
SUCTION TUBE FRAZIER 10FR DISP (MISCELLANEOUS) ×1 IMPLANT
SUT VIC AB 0 CT1 36 (SUTURE) ×12 IMPLANT
SUT VIC AB 2-0 CT2 27 (SUTURE) ×12 IMPLANT
SYR 20CC LL (SYRINGE) ×3 IMPLANT
SYR 30ML LL (SYRINGE) ×3 IMPLANT
SYR 50ML LL SCALE MARK (SYRINGE) ×6 IMPLANT
SYSTEM AUTOTRANSFUS DUAL TROCR (MISCELLANEOUS) ×1 IMPLANT
TIP FAN IRRIG PULSAVAC PLUS (DISPOSABLE) ×1 IMPLANT
TRAY FOLEY CATH SILVER 16FR LF (SET/KITS/TRAYS/PACK) ×3 IMPLANT
WRAPON POLAR PAD KNEE (MISCELLANEOUS) ×3

## 2017-06-22 NOTE — Progress Notes (Signed)
PHARMACIST - PHYSICIAN ORDER COMMUNICATION  CONCERNING: P&T Medication Policy on Herbal Medications  DESCRIPTION:  This patient's order for:  Osteo-Biflex  has been noted.  This product(s) is classified as an "herbal" or natural product. Due to a lack of definitive safety studies or FDA approval, nonstandard manufacturing practices, plus the potential risk of unknown drug-drug interactions while on inpatient medications, the Pharmacy and Therapeutics Committee does not permit the use of "herbal" or natural products of this type within [facility].   ACTION TAKEN: The pharmacy department is unable to verify this order at this time Please reevaluate patient's clinical condition at discharge and address if the herbal or natural product(s) should be resumed at that time.

## 2017-06-22 NOTE — H&P (Signed)
Paper H&P to be scanned into permanent record. H&P reviewed and patient re-examined. No changes. 

## 2017-06-22 NOTE — Transfer of Care (Signed)
Immediate Anesthesia Transfer of Care Note  Patient: Richard Castaneda  Procedure(s) Performed: TOTAL KNEE REVISION, CONVERTING A PARTIAL KNEE TO A TOTAL (Left Knee)  Patient Location: PACU  Anesthesia Type:Spinal  Level of Consciousness: awake  Airway & Oxygen Therapy: Patient connected to nasal cannula oxygen  Post-op Assessment: Post -op Vital signs reviewed and stable  Post vital signs: stable  Last Vitals:  Vitals:   06/22/17 1618 06/22/17 1621  BP: 129/61 129/61  Pulse: 72 73  Resp: 17 15  Temp: 36.7 C   SpO2: 99% 100%    Last Pain:  Vitals:   06/22/17 1618  TempSrc:   PainSc: Asleep         Complications: No apparent anesthesia complications

## 2017-06-22 NOTE — Anesthesia Procedure Notes (Signed)
Spinal  Patient location during procedure: OR Start time: 06/22/2017 1:37 PM End time: 06/22/2017 1:45 PM Staffing Anesthesiologist: Yves Dillarroll, Paul, MD Resident/CRNA: Irving BurtonBachich, Frida Wahlstrom, CRNA Performed: resident/CRNA  Preanesthetic Checklist Completed: patient identified, site marked, surgical consent, pre-op evaluation, IV checked, risks and benefits discussed and monitors and equipment checked Spinal Block Patient position: sitting Prep: ChloraPrep Patient monitoring: continuous pulse ox, heart rate and blood pressure Approach: midline Location: L3-4 Injection technique: single-shot Needle Needle type: Pencan  Needle gauge: 24 G Needle length: 9 cm

## 2017-06-22 NOTE — Consult Note (Addendum)
[facility] [place] [facility]   PATIENT NAME: Richard Castaneda    MR#:  [id number]  DATE OF BIRTH:  [birth year]  DATE OF ADMISSION:  06/22/2017  PRIMARY CARE PHYSICIAN: Marisue Ivan, MD   REQUESTING/REFERRING PHYSICIAN: Dr. Joice Lofts.  CHIEF COMPLAINT:  No chief complaint on file.  Uncontrolled hypertension. HISTORY OF PRESENT ILLNESS:  Richard Castaneda  is a 84 y.o. male with a known history of multiple medical problems as below.  The patient was admitted for revision left TKA.  His blood pressure is not controlled today.  He is status post  Revision left TKA and has local pain 5/10.  He denies any other symptoms.  Dr. Joice Lofts requests medical consult. PAST MEDICAL HISTORY:   Past Medical History:  Diagnosis Date  . Acquired hypothyroidism 11/16/2016  . Anterior myocardial infarction (HCC) 12/29/2013   Overview:  2004  . Benign essential HTN 12/29/2013  . Benign prostatic hyperplasia with urinary frequency 10/19/2016  . Borderline diabetes mellitus 11/16/2016  . Coronary artery disease 12/29/2013   Overview:  Anterior MI, PCI and stent placement of LAD11/04  . H/O iron deficiency anemia 09/09/2015  . History of BPH   . Inguinal hernia, bilateral   . Moderate tricuspid insufficiency 09/12/2014  . Occasional tremors    Left hand only with writing and eating  . Pre-diabetes   . Primary osteoarthritis of left knee 08/26/2016  . Pure hypercholesterolemia 12/29/2013  . PVD (peripheral vascular disease) (HCC) 12/29/2013   Overview:  With carotid atherosclerosis     PAST SURGICAL HISTORY:   Past Surgical History:  Procedure Laterality Date  . CATARACT EXTRACTION W/ INTRAOCULAR LENS  IMPLANT, BILATERAL    . CORONARY ANGIOPLASTY  2004   1 stent  . EYE SURGERY Bilateral    Cataract Extraction with IOL  . HERNIA REPAIR Bilateral    Inguinal Hernia Repair  . PARTIAL KNEE ARTHROPLASTY Left 12/31/2016   Procedure: UNICOMPARTMENTAL KNEE;  Surgeon: Christena Flake, MD;   Location: [facility] ORS;  Service: Orthopedics;  Laterality: Left;    SOCIAL HISTORY:   Social History   Tobacco Use  . Smoking status: Never Smoker  . Smokeless tobacco: Never Used  Substance Use Topics  . Alcohol use: No    FAMILY HISTORY:   Family History  Problem Relation Age of Onset  . Kidney failure Mother   . Prostate cancer Paternal Uncle   . Bladder Cancer Neg Hx   . Kidney cancer Neg Hx     DRUG ALLERGIES:  No Known Allergies  REVIEW OF SYSTEMS:   Review of Systems  Constitutional: Negative for chills, fever and malaise/fatigue.  HENT: Negative for sore throat.   Eyes: Negative for blurred vision and double vision.  Respiratory: Negative for cough, hemoptysis, shortness of breath, wheezing and stridor.   Cardiovascular: Negative for chest pain, palpitations, orthopnea and leg swelling.  Gastrointestinal: Negative for abdominal pain, blood in stool, diarrhea, melena, nausea and vomiting.  Genitourinary: Negative for dysuria, flank pain and hematuria.  Musculoskeletal: Positive for joint pain. Negative for back pain.  Skin: Negative for rash.  Neurological: Negative for dizziness, sensory change, focal weakness, seizures, loss of consciousness, weakness and headaches.  Endo/Heme/Allergies: Negative for polydipsia.  Psychiatric/Behavioral: Negative for depression. The patient is not nervous/anxious.     MEDICATIONS AT HOME:   Prior to Admission medications   Medication Sig Start Date End Date Taking? Authorizing Provider  acetaminophen (TYLENOL) 650 MG CR tablet Take 650 mg by mouth 3 (  three) times daily.   Yes [provider]  amLODipine (NORVASC) 5 MG tablet Take 5 mg by mouth daily before breakfast.  10/30/16  Yes [provider]  aspirin EC 81 MG tablet Take 81 mg by mouth daily.    Yes [provider]  Cholecalciferol (VITAMIN D3) 400 units CAPS Take 400 Units by mouth daily with supper.   Yes [provider]  ferrous  sulfate 325 (65 FE) MG tablet Take 325 mg by mouth daily with breakfast.   Yes [provider]  finasteride (PROSCAR) 5 MG tablet TAKE 1 TABLET (5 MG TOTAL) BY MOUTH ONCE DAILY. 10/26/16  Yes [provider]  fluticasone (FLONASE) 50 MCG/ACT nasal spray Place 1-2 sprays into both nostrils daily as needed. For allergies. 03/18/17  Yes [provider]  levothyroxine (SYNTHROID, LEVOTHROID) 150 MCG tablet Take 150 mcg by mouth daily at 6 (six) AM. 05/21/17  Yes [provider]  lisinopril (PRINIVIL,ZESTRIL) 40 MG tablet Take 40 mg by mouth daily before breakfast.  10/27/16  Yes [provider]  mirabegron ER (MYRBETRIQ) 50 MG TB24 tablet Take 1 tablet (50 mg total) by mouth daily. Patient taking differently: Take 50 mg by mouth daily at 8 pm. (2030) 03/04/17  Yes McGowan, Carollee HerterShannon A, PA-C  Misc Natural Products (OSTEO BI-FLEX ADV TRIPLE ST) TABS Take 1 tablet by mouth 2 (two) times daily.   Yes [provider]  Omega-3 Fatty Acids (FISH OIL) 1200 MG CAPS Take 1,200 mg by mouth daily with breakfast.    Yes [provider]  simvastatin (ZOCOR) 20 MG tablet Take 20 mg by mouth daily at 8 pm. (2000)   Yes [provider]  tetrahydrozoline 0.05 % ophthalmic solution Place 1 drop into both eyes daily. Visine Clear   Yes [provider]  traMADol (ULTRAM) 50 MG tablet Take 50 mg by mouth every 6 (six) hours as needed (for severe pain.).  03/01/17  Yes [provider]      VITAL SIGNS:  Blood pressure (!) 172/64, pulse 81, temperature 97.9 F (36.6 C), temperature source Oral, resp. rate 18, height 5\' 5"  (1.651 m), weight 151 lb (68.5 kg), SpO2 96 %.  PHYSICAL EXAMINATION:  Physical Exam  GENERAL:  84 y.o.-year-old patient lying in the bed with no acute distress.  EYES: Pupils equal, round, reactive to light and accommodation. No scleral icterus. Extraocular muscles intact.  HEENT: Head atraumatic, normocephalic.  Oropharynx and nasopharynx clear.  NECK:  Supple, no jugular venous distention. No thyroid enlargement, no tenderness.  LUNGS: Normal breath sounds bilaterally, no wheezing, rales,rhonchi or crepitation. No use of accessory muscles of respiration.  CARDIOVASCULAR: S1, S2 normal. No murmurs, rubs, or gallops.  ABDOMEN: Soft, nontender, nondistended. Bowel sounds present. No organomegaly or mass.  EXTREMITIES: No pedal edema, cyanosis, or clubbing.  NEUROLOGIC: Cranial nerves II through XII are intact. Muscle strength 5/5 in all extremities. Sensation intact. Gait not checked.  PSYCHIATRIC: The patient is alert and oriented x 3.  SKIN: No obvious rash, lesion, or ulcer.   LABORATORY PANEL:   CBC No results for input(s): WBC, HGB, HCT, PLT in the last 168 hours. ------------------------------------------------------------------------------------------------------------------  Chemistries  No results for input(s): NA, K, CL, CO2, GLUCOSE, BUN, CREATININE, CALCIUM, MG, AST, ALT, ALKPHOS, BILITOT in the last 168 hours.  Invalid input(s): GFRCGP ------------------------------------------------------------------------------------------------------------------  Cardiac Enzymes No results for input(s): TROPONINI in the last 168 hours. ------------------------------------------------------------------------------------------------------------------  RADIOLOGY:  Dg Knee Left Port  Result Date: 06/22/2017 CLINICAL DATA:  84 year old male post right knee replacement. Initial encounter. EXAM: PORTABLE LEFT KNEE - 1-2 VIEW COMPARISON:  12/31/2016 plain film exam. FINDINGS: Post total left knee replacement which appears in satisfactory position without complication noted. Vascular calcifications. IMPRESSION: Post total left knee replacement in satisfactory position. Electronically Signed   By: Lacy Duverney M.D.   On: 06/22/2017 17:31      IMPRESSION AND PLAN:   Accelerated hypertension. Continue  Norvasc and lisinopril, IV hydralazine as needed. Follow-up CBC and BMP. PVD and CAD.  Continue aspirin and Zocor. BPH.  Continue Flomax. Hypothyroidism.  Continue Synthroid. 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 S/P Revision left TKA  Pain control.  All the records are reviewed and case discussed with ED provider. Management plans discussed with the patient, his wife and son, nd they are in agreement.  CODE STATUS: Full code  TOTAL TIME TAKING CARE OF THIS PATIENT: 45 minutes.    Shaune Pollack M.D on 06/22/2017 at 7:15 PM  Between 7am to 6pm - Pager - [phone]  After 6pm go to [email or web address] - Social research officer, government  [facility] [place] [facility]  Office  [phone]  CC: Primary care physician; Marisue Ivan, MD   Note: This dictation was prepared with Dragon dictation along with smaller phrase technology. Any transcriptional errors that result from this process are unin

## 2017-06-22 NOTE — Anesthesia Post-op Follow-up Note (Signed)
Anesthesia QCDR form completed.        

## 2017-06-22 NOTE — Anesthesia Preprocedure Evaluation (Addendum)
Anesthesia Evaluation  Patient identified by MRN, date of birth, ID band Patient awake    Reviewed: Allergy & Precautions, NPO status , Patient's Chart, lab work & pertinent test results, reviewed documented beta blocker date and time   Airway Mallampati: II  TM Distance: >3 FB     Dental  (+) Chipped, Partial Lower, Partial Upper   Pulmonary    Pulmonary exam normal        Cardiovascular hypertension, Pt. on medications + CAD, + Past MI, + Cardiac Stents and + Peripheral Vascular Disease  Normal cardiovascular exam+ Valvular Problems/Murmurs      Neuro/Psych negative neurological ROS     GI/Hepatic   Endo/Other  diabetesHypothyroidism   Renal/GU      Musculoskeletal  (+) Arthritis , Osteoarthritis,    Abdominal Normal abdominal exam  (+)   Peds  Hematology   Anesthesia Other Findings   Reproductive/Obstetrics                             Anesthesia Physical  Anesthesia Plan  ASA: III  Anesthesia Plan: Spinal   Post-op Pain Management:    Induction: Intravenous  PONV Risk Score and Plan:   Airway Management Planned: Nasal Cannula  Additional Equipment:   Intra-op Plan:   Post-operative Plan:   Informed Consent: I have reviewed the patients History and Physical, chart, labs and discussed the procedure including the risks, benefits and alternatives for the proposed anesthesia with the patient or authorized representative who has indicated his/her understanding and acceptance.   Dental advisory given  Plan Discussed with: CRNA and Surgeon  Anesthesia Plan Comments:         Anesthesia Quick Evaluation

## 2017-06-22 NOTE — Op Note (Signed)
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  Post-Op Diagnosis:   Same.  Procedure:   Revision left TKA using all-cemented [employer] Vanguard system with a 70 mm PCR femur, a 67 mm tibial tray with a 14 mm E-poly AS insert, and a 34 x 8.5 mm all-poly 3-pegged domed patella.  Surgeon:   Maryagnes Amos, MD  Assistant:   Horris Latino, PA-C   Anesthesia:   Spinal  Findings:   As above  Complications:   None  EBL:   20 cc  Fluids:   1300 cc crystalloid  UOP:   400 cc  TT:   105 minutes at 300 mmHg  Drains:   None  Closure:   Staples  Implants:   As above  Brief Clinical Note:   The patient is a 84 year old male who is now 6 months status post a left UKA. The patient initially was doing well, but then began to notice increased pain in the medial aspect of his knee several months ago. The symptoms have persisted despite medications, activity modification, etc. His history and examination were suspicious for aseptic loosening of the tibial component which was confirmed by plain radiographs. The patient presents at this time for a revision left total knee arthroplasty.  Procedure:   The patient was brought into the operating room. After adequate spinal anesthesia was obtained, the patient was lain in the supine position. A Foley catheter was placed by the nurse before the right lower extremity was prepped with ChloraPrep solution and draped sterilely. Preoperative antibiotics were administered. After verifying the proper laterality with a surgical timeout, the limb was exsanguinated with an Esmarch and the tourniquet inflated to 300 mmHg. A standard anterior approach to the knee was made through an approximately 6-77 inch incision, incorporating the prior incision. The incision was carried down through the subcutaneous tissues to expose superficial retinaculum. This was split the length of the incision and the medial flap  elevated sufficiently to expose the medial retinaculum. The medial retinaculum was incised, leaving a 3-4 mm cuff of tissue on the patella. A culture of the joint fluid was obtained at this time. This was extended distally along the medial border of the patellar tendon and proximally through the medial third of the quadriceps tendon. A subtotal fat pad excision was performed before the soft tissues were elevated off the anteromedial and anterolateral aspects of the proximal tibia to the level of the collateral ligaments. The knee was flexed to 90 degrees and the meniscal bearing insert removed. Each of the remaining components removed using flexible osteotomes to loosen the components before each was levered out using a small curved osteotome. Minimal bone loss occurred with removal of the cemented implants. With the knee flexed to 90, the external tibial guide was positioned and the appropriate proximal tibial cut made with care taken to minimize any further medial bone removal. The remaining surface was measured and found to be optimally replicated by a 67 mm component.  Attention was directed to the distal femur. The intramedullary canal was accessed through a 3/8" drill hole. The intramedullary guide was inserted and position in order to obtain a neutral flexion gap. The intercondylar block was positioned with care taken to avoid notching the anterior cortex of the femur. The appropriate cut was made. Next, the distal cutting block was placed at 5 of valgus alignment. Using the 9 mm slot, the distal cut was  made. The distal femur was measured and found to be optimally replicated by the 70 mm component. The 70 mm 4-in-1 cutting block was positioned and first the posterior, then the posterior chamfer, the anterior chamfer, femoral and intercondylar cuts were made. At this point, the posterior portions medial and lateral menisci were removed. A trial reduction was performed using the appropriate femoral and  tibial components with first the 10 mm and then the 14 mm insert. The 14 mm insert demonstrated excellent stability to varus and valgus stressing both in flexion and extension while permitting full extension. Patella tracking was assessed and found to be excellent. Therefore, the tibial guide position was marked on the proximal tibia. The patella thickness was measured and found to be 26 mm. Therefore, the appropriate cut was made. The patellar surface was measured and found to be optimally replicated by the 34 mm component. The three peg holes were drilled in place before the trial button was inserted. Patella tracking was assessed and found to be excellent, passing the "no thumb test". The lug holes were drilled into the distal femur before the trial component was removed, leaving only the tibial tray. The keel was then created using the appropriate tower and punch.  The bony surfaces were prepared for cementing by irrigating thoroughly with bacitracin saline solution. A bone plug was fashioned from some of the bone that had been removed previously and used to plug the distal femoral canal. In addition, 20 cc of Exparel diluted out to 60 cc with normal saline and 30 cc of 0.5% Sensorcaine were injected into the postero-medial and postero-lateral aspects of the knee, the medial and lateral gutter regions, and the peri-incisional tissues to help with postoperative analgesia. Meanwhile, the cement was being mixed on the back table. When it was ready, the tibial tray was cemented in first. The excess cement was removed using Personal assistantreer elevators. Next, the femoral component was impacted into place. Again, the excess cement was removed using Personal assistantreer elevators. The 14 mm trial insert was positioned and the knee brought into extension while the cement hardened. Finally, the patella was cemented into place and secured using the patellar clamp. Again, the excess cement was removed using Personal assistantreer elevators. Once the cement had  hardened, the knee was placed through a range of motion with the findings as described above.  There appeared to be some mild anterior-posterior displacement in flexion, so it was elected to proceed with an anterior stabilized insert. The trial insert was removed and, after verifying that no cement had been retained posteriorly, the permanent 14 mm AS insert was positioned and secured using the appropriate key locking mechanism. Again the knee was placed through a range of motion. The knee demonstrated excellent stability to varus and valgus stressing both in flexion and extension while permitting full extension, and easily could be flexed knee beyond 130 degrees as well. Patella tracking was assessed and found to be excellent, passing the "no thumb" test.  The wound was copiously irrigated with bacitracin saline solution using the jet lavage system before the quadriceps tendon and retinacular layer were reapproximated using #0 Vicryl interrupted sutures. The superficial retinacular layer also was closed using a running #0 Vicryl suture. A total of 10 cc of transexemic acid (TXA) was injected intra-articularly before the subcutaneous tissues were closed in several layers using 2-0 Vicryl interrupted sutures. The skin was closed using staples. A sterile honeycomb dressing was applied to the skin before the leg was wrapped with an Ace wrap  to accommodate the polar pack. The patient was then awakened and returned to the recovery room in satisfactory condition after tolerating the procedure well.

## 2017-06-22 NOTE — Progress Notes (Signed)
Pt admitted to 136 from PACU, A&Ox4. Polar care and ace wrap in place to left knee. PRN oxycodone given with reduction in pain. Pt dangled at bedside and voided in urinal. Pt's BPs have been elevated 170-180s since PACU. Dr. Signa KellSunny Patel notified, received order for hospitalist consult. Dr. Imogene Burnhen to see pt.   MundayHudson, Latricia HeftKorie G

## 2017-06-23 ENCOUNTER — Encounter: Payer: Self-pay | Admitting: Surgery

## 2017-06-23 LAB — CBC WITH DIFFERENTIAL/PLATELET
BASOS PCT: 0 %
Basophils Absolute: 0 10*3/uL (ref 0–0.1)
EOS ABS: 0.2 10*3/uL (ref 0–0.7)
Eosinophils Relative: 2 %
HEMATOCRIT: 33.7 % — AB (ref 40.0–52.0)
Hemoglobin: 11.4 g/dL — ABNORMAL LOW (ref 13.0–18.0)
Lymphocytes Relative: 8 %
Lymphs Abs: 0.8 10*3/uL — ABNORMAL LOW (ref 1.0–3.6)
MCH: 32.8 pg (ref 26.0–34.0)
MCHC: 34 g/dL (ref 32.0–36.0)
MCV: 96.5 fL (ref 80.0–100.0)
MONO ABS: 0.9 10*3/uL (ref 0.2–1.0)
MONOS PCT: 9 %
NEUTROS ABS: 8.4 10*3/uL — AB (ref 1.4–6.5)
Neutrophils Relative %: 81 %
Platelets: 211 10*3/uL (ref 150–440)
RBC: 3.49 MIL/uL — ABNORMAL LOW (ref 4.40–5.90)
RDW: 12.5 % (ref 11.5–14.5)
WBC: 10.4 10*3/uL (ref 3.8–10.6)

## 2017-06-23 LAB — BASIC METABOLIC PANEL
Anion gap: 8 (ref 5–15)
BUN: 25 mg/dL — ABNORMAL HIGH (ref 6–20)
CALCIUM: 8.3 mg/dL — AB (ref 8.9–10.3)
CO2: 25 mmol/L (ref 22–32)
CREATININE: 1.19 mg/dL (ref 0.61–1.24)
Chloride: 106 mmol/L (ref 101–111)
GFR calc non Af Amer: 54 mL/min — ABNORMAL LOW (ref >60)
Glucose, Bld: 112 mg/dL — ABNORMAL HIGH (ref 65–99)
Potassium: 4.7 mmol/L (ref 3.5–5.1)
Sodium: 139 mmol/L (ref 135–145)

## 2017-06-23 NOTE — Progress Notes (Signed)
Pt refused CPM after getting back in the chair. He states he wants "to sleep."  LansingHudson, Latricia HeftKorie G

## 2017-06-23 NOTE — Care Management Note (Signed)
Case Management Note  Patient Details  Name: Richard Castaneda MRN: [id number] Date of Birth: November 02, 1929  Subjective/Objective:                  RNCM met with patient to discuss transition of care. He states that he is from home with his wife but that his daughter and son plan to attend to him when he gets home.  He plans to return home at discharge. He has a front-wheeled walker available for use.  He requests [facility]". He uses [facility] [phone] for medications. Dr. Roland Rack would like for patient to have Lovenox/generic okay 69m injection daily for 14 days-no refills called into pharmacy.   Action/Plan:   Home health list provided to patient. Referral to [facility] with patient's request. Lovenox called in to pharmacy for price.    Expected Discharge Date:                  Expected Discharge Plan:     In-House Referral:     Discharge planning Services  CM Consult  Post Acute Care Choice:  Home Health Choice offered to:  Patient  DME Arranged:    DME Agency:     [facility] Arranged[facility]  [facility]  Status of Service:  In process, will continue to follow  If discussed at Long Length of Stay Meetings, dates discussed:    Additional Comments:  AMarshell Garfinkel RN 06/23/2017, 12:14 PM

## 2017-06-23 NOTE — Progress Notes (Signed)
[facility] - [place] at [facility]   PATIENT NAME: Richard Castaneda    MR#:  [id number]  DATE OF BIRTH:  [birth year]  SUBJECTIVE: medical  consult follow-up for hypertension.  Patient blood pressure is better today.  Patient is sitting in the chair.  Says that his knee pain is controlled.  CHIEF COMPLAINT:  No chief complaint on file.   REVIEW OF SYSTEMS:   ROS CONSTITUTIONAL: No fever, fatigue or weakness.  EYES: No blurred or double vision.  EARS, NOSE, AND THROAT: No tinnitus or ear pain.  RESPIRATORY: No cough, shortness of breath, wheezing or hemoptysis.  CARDIOVASCULAR: No chest pain, orthopnea, edema.  GASTROINTESTINAL: No nausea, vomiting, diarrhea or abdominal pain.  GENITOURINARY: No dysuria, hematuria.  ENDOCRINE: No polyuria, nocturia,  HEMATOLOGY: No anemia, easy bruising or bleeding SKIN: No rash or lesion. MUSCULOSKELETAL: No joint pain or arthritis.   NEUROLOGIC: No tingling, numbness, weakness.  PSYCHIATRY: No anxiety or depression.   DRUG ALLERGIES:  No Known Allergies  VITALS:  Blood pressure (!) 140/59, pulse 85, temperature 98.1 F (36.7 C), temperature source Oral, resp. rate 16, height 5\' 5"  (1.651 m), weight 68.5 kg (151 lb), SpO2 97 %.  PHYSICAL EXAMINATION:  GENERAL:  84 y.o.-year-old patient lying in the bed with no acute distress.  EYES: Pupils equal, round, reactive to light and accommodation. No scleral icterus. Extraocular muscles intact.  HEENT: Head atraumatic, normocephalic. Oropharynx and nasopharynx clear.  NECK:  Supple, no jugular venous distention. No thyroid enlargement, no tenderness.  LUNGS: Normal breath sounds bilaterally, no wheezing, rales,rhonchi or crepitation. No use of accessory muscles of respiration.  CARDIOVASCULAR: S1, S2 normal. No murmurs, rubs, or gallops.  ABDOMEN: Soft, nontender, nondistended. Bowel sounds present. No organomegaly or mass.  EXTREMITIES: No pedal edema, cyanosis, or clubbing.   Left knee repair, has ice pack on.  And sitting in the chair. NEUROLOGIC: Cranial nerves II through XII are intact. Muscle strength 5/5 in all extremities. Sensation intact. Gait not checked.  PSYCHIATRIC: The patient is alert and oriented x 3.  SKIN: No obvious rash, lesion, or ulcer.    LABORATORY PANEL:   CBC Recent Labs  Lab 06/23/17 0302  WBC 10.4  HGB 11.4*  HCT 33.7*  PLT 211   ------------------------------------------------------------------------------------------------------------------  Chemistries  Recent Labs  Lab 06/23/17 0302  NA 139  K 4.7  CL 106  CO2 25  GLUCOSE 112*  BUN 25*  CREATININE 1.19  CALCIUM 8.3*   ------------------------------------------------------------------------------------------------------------------  Cardiac Enzymes No results for input(s): TROPONINI in the last 168 hours. ------------------------------------------------------------------------------------------------------------------  RADIOLOGY:  Dg Knee Left Port  Result Date: 06/22/2017 CLINICAL DATA:  84 year old male post right knee replacement. Initial encounter. EXAM: PORTABLE LEFT KNEE - 1-2 VIEW COMPARISON:  12/31/2016 plain film exam. FINDINGS: Post total left knee replacement which appears in satisfactory position without complication noted. Vascular calcifications. IMPRESSION: Post total left knee replacement in satisfactory position. Electronically Signed   By: Lacy Duverney M.D.   On: 06/22/2017 17:31    EKG:   Orders placed or performed during the hospital encounter of 06/14/17  . EKG 12-Lead  . EKG 12-Lead    ASSESSMENT AND PLAN:   Left total knee revision, getting better with physical therapy, pain medicine, further management as per orthopedic physician. 2 /essential hypertension: Uncontrolled yesterday after the operation likely secondary to knee pain.   BP is better today continue amlodipine, lisinopril at present dose.  Use as needed hydralazine.  But  I suspect patient can  be discharged whenever he is ready with home dose amlodipine, lisinopril, patient already follows up with Dr. Kowalski and also Dr. Burnadette PopLinthavong.    All the records are reviewed and Gwen Poundscase discussed with Care Management/Social Workerr. Management plans discussed with the patient, family and they are in agreement.  CODE STATUS: Full code  TOTAL TIME TAKING CARE OF THIS PATIENT: 35 minutes.   POSSIBLE D/C IN 1-2 DAYS, DEPENDING ON CLINICAL CONDITION.   Katha HammingSnehalatha Greggory Safranek M.D on 06/23/2017 at 11:50 AM  Between 7am to 6pm - Pager - [phone]  After 6pm go to [email or web address] - password [email or web address] [facility]  [facility]  Office  [phone]  CC: Primary care physician; Marisue IvanLinthavong, Kanhka, MD   Note: This dictation was prepared with Dragon dictation along with smaller phrase technology. Any transcriptional errors that result from this process are unintentional.

## 2017-06-23 NOTE — Progress Notes (Signed)
  Subjective: 1 Day Post-Op Procedure(s) (LRB): TOTAL KNEE REVISION, CONVERTING A PARTIAL KNEE TO A TOTAL (Left) Patient reports pain as mild.   Patient is well, but has had some minor complaints of hypertension, internal med consulted, improved this AM PT and Care management to assist with discharge planning. Negative for chest pain and shortness of breath Fever: no Gastrointestinal:Negative for nausea and vomiting.  Objective: Vital signs in last 24 hours: Temp:  [97.8 F (36.6 C)-98.6 F (37 C)] 98.1 F (36.7 C) (02/20 0741) Pulse Rate:  [67-85] 85 (02/20 0741) Resp:  [15-19] 16 (02/20 0741) BP: (129-182)/(59-80) 140/59 (02/20 0741) SpO2:  [94 %-100 %] 97 % (02/20 0741) Weight:  [68.5 kg (151 lb)] 68.5 kg (151 lb) (02/19 1159)  Intake/Output from previous day:  Intake/Output Summary (Last 24 hours) at 06/23/2017 0746 Last data filed at 06/23/2017 0737 Gross per 24 hour  Intake 2066.25 ml  Output 1320 ml  Net 746.25 ml    Intake/Output this shift: Total I/O In: -  Out: 100 [Urine:100]  Labs: Recent Labs    06/23/17 0302  HGB 11.4*   Recent Labs    06/23/17 0302  WBC 10.4  RBC 3.49*  HCT 33.7*  PLT 211   Recent Labs    06/23/17 0302  NA 139  K 4.7  CL 106  CO2 25  BUN 25*  CREATININE 1.19  GLUCOSE 112*  CALCIUM 8.3*   No results for input(s): LABPT, INR in the last 72 hours.   EXAM General - Patient is Alert, Appropriate and Oriented Extremity - Sensation intact distally Intact pulses distally Dorsiflexion/Plantar flexion intact Incision: moderate drainage No cellulitis present Dressing/Incision - blood tinged drainage Motor Function - intact, moving foot and toes well on exam.   Abdomen soft with normal BS.  Past Medical History:  Diagnosis Date  . Acquired hypothyroidism 11/16/2016  . Anterior myocardial infarction (HCC) 12/29/2013   Overview:  2004  . Benign essential HTN 12/29/2013  . Benign prostatic hyperplasia with urinary  frequency 10/19/2016  . Borderline diabetes mellitus 11/16/2016  . Coronary artery disease 12/29/2013   Overview:  Anterior MI, PCI and stent placement of LAD11/04  . H/O iron deficiency anemia 09/09/2015  . History of BPH   . Inguinal hernia, bilateral   . Moderate tricuspid insufficiency 09/12/2014  . Occasional tremors    Left hand only with writing and eating  . Pre-diabetes   . Primary osteoarthritis of left knee 08/26/2016  . Pure hypercholesterolemia 12/29/2013  . PVD (peripheral vascular disease) (HCC) 12/29/2013   Overview:  With carotid atherosclerosis     Assessment/Plan: 1 Day Post-Op Procedure(s) (LRB): TOTAL KNEE REVISION, CONVERTING A PARTIAL KNEE TO A TOTAL (Left) Active Problems:   Status post revision of total knee replacement, left  Estimated body mass index is 25.13 kg/m as calculated from the following:   Height as of this encounter: 5\' 5"  (1.651 m).   Weight as of this encounter: 68.5 kg (151 lb). Advance diet Up with therapy D/C IV fluids when tolerating po intake.  Labs reviewed this morning. Internal med consulted for HTN, improved. CBC and BMP tomorrow morning. Up with therapy today. Begin working on having a BM.  DVT Prophylaxis - Lovenox, Foot Pumps and TED hose Weight-Bearing as tolerated to left leg  J. Horris LatinoLance Jauna Raczynski, PA-C [facility] 06/23/2017, 7:46 AM

## 2017-06-23 NOTE — Anesthesia Postprocedure Evaluation (Signed)
Anesthesia Post Note  Patient: Richard Castaneda  Procedure(s) Performed: TOTAL KNEE REVISION, CONVERTING A PARTIAL KNEE TO A TOTAL (Left Knee)  Patient location during evaluation: Nursing Unit Anesthesia Type: Spinal Level of consciousness: awake, awake and alert and oriented Pain management: pain level controlled Vital Signs Assessment: post-procedure vital signs reviewed and stable Respiratory status: spontaneous breathing, nonlabored ventilation and respiratory function stable Cardiovascular status: blood pressure returned to baseline and stable Postop Assessment: no headache and no backache Anesthetic complications: no     Last Vitals:  Vitals:   06/22/17 2215 06/23/17 0348  BP: (!) 145/75 (!) 158/65  Pulse: 80 84  Resp: 18 18  Temp: 36.9 C 36.8 C  SpO2: 97% 96%    Last Pain:  Vitals:   06/23/17 0348  TempSrc: Oral  PainSc:                  Richard Castaneda

## 2017-06-23 NOTE — Progress Notes (Signed)
Physical Therapy Treatment Patient Details Name: Richard Castaneda MRN: [id number] DOB: 11-Oct-1929 Today's Date: 06/23/2017    History of Present Illness admitted status post L TKR revision (06/22/17), WBAT.    PT Comments    Patient reporting mild soreness in L knee this PM, requiring intermittent cuing for L TKE in all loading phases of gait/standing activities.  Initiated stair/curb training to ensure ability to safely enter/exit home at discharge; completing with min/mod assist.  L knee control improved with repetition/cuing.   Wife, son present throughout session to observe/encourage patient.    Follow Up Recommendations  Home health PT     Equipment Recommendations  Rolling walker with 5" wheels    Recommendations for Other Services       Precautions / Restrictions Precautions Precautions: Fall Restrictions Weight Bearing Restrictions: Yes LLE Weight Bearing: Weight bearing as tolerated    Mobility  Bed Mobility               General bed mobility comments: seated in recliner beginning/end of treatment session  Transfers Overall transfer level: Needs assistance Equipment used: Rolling walker (2 wheeled) Transfers: Sit to/from Stand Sit to Stand: Min guard         General transfer comment: cuing for hand placement; decreased active use of L LE with movement transition (reporting increased soreness in PM)  Ambulation/Gait Ambulation/Gait assistance: Min guard Ambulation Distance (Feet): 75 Feet(x2) Assistive device: Rolling walker (2 wheeled)       General Gait Details: reciprocal stepping pattern, mildly antalgic L LE; cuing for L TKE in loading phases of gait cycle   Stairs Stairs: Yes       General stair comments: single platform step with RW, min/mod assist; up/down 4 steps with bilat rails, min assist.  Min cuing for technique and overall safety, verbal cuing for L TKE in modified SLS  Wheelchair Mobility    Modified Rankin (Stroke Patients  Only)       Balance Overall balance assessment: Needs assistance Sitting-balance support: No upper extremity supported;Feet supported Sitting balance-Leahy Scale: Good     Standing balance support: Bilateral upper extremity supported Standing balance-Leahy Scale: Fair                              Cognition Arousal/Alertness: Awake/alert Behavior During Therapy: WFL for tasks assessed/performed Overall Cognitive Status: Within Functional Limits for tasks assessed                                        Exercises      General Comments        Pertinent Vitals/Pain Pain Assessment: Faces Faces Pain Scale: Hurts even more Pain Location: L knee Pain Descriptors / Indicators: Aching;Grimacing;Guarding Pain Intervention(s): Limited activity within patient's tolerance;Premedicated before session;Repositioned;Monitored during session    Home Living                      Prior Function            PT Goals (current goals can now be found in the care plan section) Acute Rehab PT Goals Patient Stated Goal: to return home PT Goal Formulation: With patient/family Time For Goal Achievement: 07/07/17 Potential to Achieve Goals: Good Progress towards PT goals: Progressing toward goals    Frequency    BID      PT Plan  Current plan remains appropriate    Co-evaluation              AM-PAC PT "6 Clicks" Daily Activity  Outcome Measure  Difficulty turning over in bed (including adjusting bedclothes, sheets and blankets)?: A Little Difficulty moving from lying on back to sitting on the side of the bed? : A Little Difficulty sitting down on and standing up from a chair with arms (e.g., wheelchair, bedside commode, etc,.)?: A Little Help needed moving to and from a bed to chair (including a wheelchair)?: A Little Help needed walking in hospital room?: A Little Help needed climbing 3-5 steps with a railing? : A Little 6 Click Score:  18    End of Session Equipment Utilized During Treatment: Gait belt Activity Tolerance: Patient tolerated treatment well Patient left: in chair;with call bell/phone within reach;with chair alarm set Nurse Communication: Mobility status PT Visit Diagnosis: Unsteadiness on feet (R26.81);Muscle weakness (generalized) (M62.81);Pain;Difficulty in walking, not elsewhere classified (R26.2) Pain - Right/Left: Left Pain - part of body: Knee     Time: 1539-1610 PT Time Calculation (min) (ACUTE ONLY): 31 min  Charges:  $Gait Training: 23-37 mins                    G Codes:      Shaquela Weichert H. Manson Passey, PT, DPT, NCS 06/23/17, 9:35 PM [phone]

## 2017-06-23 NOTE — Care Management (Signed)
Lovenox $90.35;patient's wife notified and agrees.

## 2017-06-23 NOTE — Evaluation (Signed)
Physical Therapy Evaluation Patient Details Name: Ina KickHoward Fritze MRN: [id number] DOB: 27-Dec-1929 Today's Date: 06/23/2017   History of Present Illness  admitted status post L TKR revision (06/22/17), WBAT.  Clinical Impression  Upon evaluation, patient alert and oriented; follows all commands and demonstrates good effort with all functional activities/mobility.  Demonstrates good post-op strength(3-/5) and ROM (4-92 degrees) in R LE with full sensation intact.  Good L quad activation and control, with good stability during all closed-chain activities.  Demonstrates ability to complete bed mobility indep; sit/stand, basic transfers and gait (200') with RW, cga/close sup.  Mild decrease in stance time/weight acceptance R LE, but no episodes of overt buckling/LOB.  Decreased cadence/gait speed (10' walk time, 11-12 seconds), though anticipate consistent progression with improved pain control. Would benefit from skilled PT to address above deficits and promote optimal return to PLOF; Recommend transition to HHPT upon discharge from acute hospitalization.     Follow Up Recommendations Home health PT    Equipment Recommendations  Rolling walker with 5" wheels    Recommendations for Other Services       Precautions / Restrictions Precautions Precautions: Fall Restrictions Weight Bearing Restrictions: Yes LLE Weight Bearing: Weight bearing as tolerated      Mobility  Bed Mobility Overal bed mobility: Modified Independent                Transfers Overall transfer level: Needs assistance Equipment used: Rolling walker (2 wheeled) Transfers: Sit to/from Stand Sit to Stand: Supervision         General transfer comment: cuing for hand placement  Ambulation/Gait Ambulation/Gait assistance: Supervision Ambulation Distance (Feet): 200 Feet Assistive device: Rolling walker (2 wheeled)   Gait velocity: 10' walk time, 11-12 seconds Gait velocity interpretation: <1.8 ft/sec,  indicative of risk for recurrent falls General Gait Details: reciprocal stepping pattern with mild decrease in L LE stance time/weight shift; fair cadence/gait speed  Stairs            Wheelchair Mobility    Modified Rankin (Stroke Patients Only)       Balance Overall balance assessment: Needs assistance Sitting-balance support: No upper extremity supported;Feet supported Sitting balance-Leahy Scale: Good     Standing balance support: Bilateral upper extremity supported Standing balance-Leahy Scale: Fair                               Pertinent Vitals/Pain Pain Assessment: 0-10 Pain Score: 7  Pain Location: L knee Pain Descriptors / Indicators: Aching;Grimacing;Guarding Pain Intervention(s): Limited activity within patient's tolerance;Monitored during session;Repositioned    Home Living Family/patient expects to be discharged to:: Private residence Living Arrangements: Spouse/significant other Available Help at Discharge: Family Type of Home: House Home Access: Stairs to enter Entrance Stairs-Rails: None Entrance Stairs-Number of Steps: 1 Home Layout: One level Home Equipment: Environmental consultantWalker - 2 wheels      Prior Function Level of Independence: Independent         Comments: Mod indep with RW for ADLs, household and limited community mobilization since previous TKR (approx 6 months prior); denies fall history.     Hand Dominance        Extremity/Trunk Assessment   Upper Extremity Assessment Upper Extremity Assessment: Overall WFL for tasks assessed    Lower Extremity Assessment Lower Extremity Assessment: (L knee grossly 3-/5 (limited by pain, dressing), ROM 4-92, sensation fully returned.  R LE grossly WFL)       Communication   Communication: No  difficulties  Cognition Arousal/Alertness: Awake/alert Behavior During Therapy: WFL for tasks assessed/performed Overall Cognitive Status: Within Functional Limits for tasks assessed                                         General Comments      Exercises Total Joint Exercises Goniometric ROM: L knee, 4-92 degrees Other Exercises Other Exercises: Seated LE therex, 1x10, AROM for muscular strength/endurance: ankle pumps, LAQs, marching.  Good isolated muscle strength with good L quad activation/control. Other Exercises: Toilet transfer, ambulatory with RW, cga/close sup; sit/stand from [facility] with RW, close sup.  min cuing for walker management (to prevent picking up walker)   Assessment/Plan    PT Assessment Patient needs continued PT services  PT Problem List Decreased strength;Decreased range of motion;Decreased balance;Decreased activity tolerance;Decreased coordination;Decreased mobility;Decreased cognition;Decreased knowledge of use of DME;Decreased safety awareness;Pain       PT Treatment Interventions DME instruction;Gait training;Stair training;Functional mobility training;Therapeutic activities;Therapeutic exercise;Balance training;Patient/family education    PT Goals (Current goals can be found in the Care Plan section)  Acute Rehab PT Goals Patient Stated Goal: to return home PT Goal Formulation: With patient Time For Goal Achievement: 07/07/17 Potential to Achieve Goals: Good    Frequency BID   Barriers to discharge        Co-evaluation               AM-PAC PT "6 Clicks" Daily Activity  Outcome Measure Difficulty turning over in bed (including adjusting bedclothes, sheets and blankets)?: A Little Difficulty moving from lying on back to sitting on the side of the bed? : A Little Difficulty sitting down on and standing up from a chair with arms (e.g., wheelchair, bedside commode, etc,.)?: A Little Help needed moving to and from a bed to chair (including a wheelchair)?: A Little Help needed walking in hospital room?: A Little Help needed climbing 3-5 steps with a railing? : A Little 6 Click Score: 18    End of Session Equipment  Utilized During Treatment: Gait belt Activity Tolerance: Patient tolerated treatment well Patient left: in chair;with call bell/phone within reach;with chair alarm set Nurse Communication: Mobility status PT Visit Diagnosis: Unsteadiness on feet (R26.81);Muscle weakness (generalized) (M62.81);Pain;Difficulty in walking, not elsewhere classified (R26.2) Pain - Right/Left: Left Pain - part of body: Knee    Time: 1610-9604 PT Time Calculation (min) (ACUTE ONLY): 21 min   Charges:   PT Evaluation $PT Eval Low Complexity: 1 Low PT Treatments $Therapeutic Exercise: 8-22 mins   PT G Codes:        Lyndal Reggio H. Manson Passey, PT, DPT, NCS 06/23/17, 10:21 AM [phone]

## 2017-06-24 LAB — CBC
HEMATOCRIT: 31.6 % — AB (ref 40.0–52.0)
Hemoglobin: 10.8 g/dL — ABNORMAL LOW (ref 13.0–18.0)
MCH: 32.7 pg (ref 26.0–34.0)
MCHC: 34.1 g/dL (ref 32.0–36.0)
MCV: 95.9 fL (ref 80.0–100.0)
Platelets: 177 10*3/uL (ref 150–440)
RBC: 3.3 MIL/uL — ABNORMAL LOW (ref 4.40–5.90)
RDW: 12.5 % (ref 11.5–14.5)
WBC: 11.4 10*3/uL — ABNORMAL HIGH (ref 3.8–10.6)

## 2017-06-24 LAB — BASIC METABOLIC PANEL
Anion gap: 7 (ref 5–15)
BUN: 26 mg/dL — AB (ref 6–20)
CALCIUM: 8.4 mg/dL — AB (ref 8.9–10.3)
CO2: 27 mmol/L (ref 22–32)
CREATININE: 1.09 mg/dL (ref 0.61–1.24)
Chloride: 103 mmol/L (ref 101–111)
GFR calc Af Amer: >60 mL/min (ref >60)
GFR, EST NON AFRICAN AMERICAN: 60 mL/min — AB (ref >60)
GLUCOSE: 136 mg/dL — AB (ref 65–99)
POTASSIUM: 4.3 mmol/L (ref 3.5–5.1)
Sodium: 137 mmol/L (ref 135–145)

## 2017-06-24 MED ORDER — ENOXAPARIN SODIUM 40 MG/0.4ML ~~LOC~~ SOLN: 40.0000 mg | SUBCUTANEOUS | 0 refills | Status: DC

## 2017-06-24 MED ORDER — OXYCODONE HCL 5 MG PO TABS: 5.0000 mg | ORAL_TABLET | ORAL | 0 refills | Status: DC | PRN

## 2017-06-24 NOTE — Progress Notes (Signed)
Discharge home with home health physical therapy.  Blood pressure controlled, continue present home medicines.,  And continue Norvasc 5 mg p.o. daily, lisinopril 40 mg p.o. Daily. Time spent reviewing medications 15 minutes

## 2017-06-24 NOTE — Discharge Instructions (Signed)
Diet: As you were doing prior to hospitalization   Shower:  May shower but keep the wounds dry, use an occlusive plastic wrap, NO SOAKING IN TUB.  If the bandage gets wet, change with a clean dry gauze.  Dressing:  You may change your dressing as needed. Change the dressing with sterile gauze dressing.    Activity:  Increase activity slowly as tolerated, but follow the weight bearing instructions below.  No lifting or driving for 6 weeks.  Weight Bearing:   Weight bearing as tolerated to left lower extremity  Blood Clot Prevention: Continue Lovenox 40mg  daily for 14 days after discharge.  This has been called into your pharmacy.  To prevent constipation: you may use a stool softener such as -  Colace (over the counter) 100 mg by mouth twice a day  Drink plenty of fluids (prune juice may be helpful) and high fiber foods Miralax (over the counter) for constipation as needed.    Itching:  If you experience itching with your medications, try taking only a single pain pill, or even half a pain pill at a time.  You may take up to 10 pain pills per day, and you can also use benadryl over the counter for itching or also to help with sleep.   Precautions:  If you experience chest pain or shortness of breath - call 911 immediately for transfer to the hospital emergency department!!  If you develop a fever greater that 101 F, purulent drainage from wound, increased redness or drainage from wound, or calf pain-Call [facility]                                              Follow- Up Appointment:  Please call for an [facility]

## 2017-06-24 NOTE — Progress Notes (Signed)
Pt in no acute distress. VSS. RN explained discharge instructions to patient with family present. Pt verbalized understanding. Pt received prescription and gave to his daughter to take to his pharmacy. IV removed. Pt wheeled to visitors entrance and assisted into sons vehicle.

## 2017-06-24 NOTE — Care Management (Signed)
Patient discharging to home today followed by [facility]. No other RNCM needs.

## 2017-06-24 NOTE — Progress Notes (Signed)
  Subjective: 2 Days Post-Op Procedure(s) (LRB): TOTAL KNEE REVISION, CONVERTING A PARTIAL KNEE TO A TOTAL (Left) Patient reports pain as mild.   Patient is well, and has had no acute complaints or problems PT and Care management to assist with discharge planning. Negative for chest pain and shortness of breath Fever: Mild, 99.4 last night. Gastrointestinal:Negative for nausea and vomiting.  Objective: Vital signs in last 24 hours: Temp:  [98.4 F (36.9 C)-99.4 F (37.4 C)] 98.7 F (37.1 C) (02/21 0737) Pulse Rate:  [82-97] 82 (02/21 0737) Resp:  [14-16] 16 (02/21 0737) BP: (116-151)/(48-65) 146/48 (02/21 0737) SpO2:  [94 %-97 %] 95 % (02/21 0737)  Intake/Output from previous day:  Intake/Output Summary (Last 24 hours) at 06/24/2017 1033 Last data filed at 06/24/2017 0015 Gross per 24 hour  Intake -  Output 650 ml  Net -650 ml    Intake/Output this shift: No intake/output data recorded.  Labs: Recent Labs    06/23/17 0302 06/24/17 0300  HGB 11.4* 10.8*   Recent Labs    06/23/17 0302 06/24/17 0300  WBC 10.4 11.4*  RBC 3.49* 3.30*  HCT 33.7* 31.6*  PLT 211 177   Recent Labs    06/23/17 0302 06/24/17 0300  NA 139 137  K 4.7 4.3  CL 106 103  CO2 25 27  BUN 25* 26*  CREATININE 1.19 1.09  GLUCOSE 112* 136*  CALCIUM 8.3* 8.4*   No results for input(s): LABPT, INR in the last 72 hours.   EXAM General - Patient is Alert, Appropriate and Oriented Extremity - Sensation intact distally Intact pulses distally Dorsiflexion/Plantar flexion intact Incision: scant drainage No cellulitis present Dressing/Incision - blood tinged drainage Motor Function - intact, moving foot and toes well on exam.   Abdomen soft with normal BS.  Past Medical History:  Diagnosis Date  . Acquired hypothyroidism 11/16/2016  . Anterior myocardial infarction (HCC) 12/29/2013   Overview:  2004  . Benign essential HTN 12/29/2013  . Benign prostatic hyperplasia with urinary frequency  10/19/2016  . Borderline diabetes mellitus 11/16/2016  . Coronary artery disease 12/29/2013   Overview:  Anterior MI, PCI and stent placement of LAD11/04  . H/O iron deficiency anemia 09/09/2015  . History of BPH   . Inguinal hernia, bilateral   . Moderate tricuspid insufficiency 09/12/2014  . Occasional tremors    Left hand only with writing and eating  . Pre-diabetes   . Primary osteoarthritis of left knee 08/26/2016  . Pure hypercholesterolemia 12/29/2013  . PVD (peripheral vascular disease) (HCC) 12/29/2013   Overview:  With carotid atherosclerosis     Assessment/Plan: 2 Days Post-Op Procedure(s) (LRB): TOTAL KNEE REVISION, CONVERTING A PARTIAL KNEE TO A TOTAL (Left) Active Problems:   Status post revision of total knee replacement, left  Estimated body mass index is 25.13 kg/m as calculated from the following:   Height as of this encounter: 5\' 5"  (1.651 m).   Weight as of this encounter: 68.5 kg (151 lb). Advance diet Up with therapy   Labs reviewed this morning. HTN better controlled at this time. Mild fevers last night, encouraged incentive spirometry Up with therapy today. Begin working on having a BM, suppository given today.  DVT Prophylaxis - Lovenox, Foot Pumps and TED hose Weight-Bearing as tolerated to left leg  J. Horris LatinoLance McGhee, PA-C [facility] 06/24/2017, 10:33 AM

## 2017-06-24 NOTE — Discharge Summary (Signed)
Physician Discharge Summary  Patient ID: Richard Castaneda MRN: [id number] DOB/AGE: 08-11-1929 84 y.o.  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 Status post revision of total knee replacement, left 06/22/2017  . Status post unicompartmental knee replacement, left 12/31/2016  . Borderline diabetes mellitus 11/16/2016  . Acquired hypothyroidism 11/16/2016  . Benign prostatic hyperplasia with urinary frequency 10/19/2016  . Primary osteoarthritis of left knee 08/26/2016  . Vaccine counseling 03/09/2016  . Medicare annual wellness visit, subsequent 03/09/2016  . Medicare annual wellness visit, initial 03/09/2016  . H/O iron deficiency anemia 09/09/2015  . Moderate tricuspid insufficiency 09/12/2014  . Moderate mitral insufficiency 09/12/2014  . PVD (peripheral vascular disease) (HCC) 12/29/2013  . Pure hypercholesterolemia 12/29/2013  . Coronary artery disease 12/29/2013  . Anterior myocardial infarction (HCC) 12/29/2013  . Benign essential HTN 12/29/2013  Pailful UKA secondary to aseptic loosening.  Past Medical History:  Diagnosis Date  . Acquired hypothyroidism 11/16/2016  . Anterior myocardial infarction (HCC) 12/29/2013   Overview:  2004  . Benign essential HTN 12/29/2013  . Benign prostatic hyperplasia with urinary frequency 10/19/2016  . Borderline diabetes mellitus 11/16/2016  . Coronary artery disease 12/29/2013   Overview:  Anterior MI, PCI and stent placement of LAD11/04  . H/O iron deficiency anemia 09/09/2015  . History of BPH   . Inguinal hernia, bilateral   . Moderate tricuspid insufficiency 09/12/2014  . Occasional tremors    Left hand only with writing and eating  . Pre-diabetes   . Primary osteoarthritis of left knee 08/26/2016  . Pure hypercholesterolemia 12/29/2013   . PVD (peripheral vascular disease) (HCC) 12/29/2013   Overview:  With carotid atherosclerosis      Transfusion: None   Consultants (if any): Shaune Pollack, MD  Discharged Condition: Improved  Hospital Course: Richard Castaneda is an 84 y.o. 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    Surgeries: Procedure(s): TOTAL KNEE REVISION, CONVERTING A PARTIAL KNEE TO A TOTAL on 06/22/2017 Patient tolerated the surgery well. Taken to PACU where she was stabilized and then transferred to the orthopedic floor.  Started on Lovenox 40mg  q 24 hrs. Foot pumps applied bilaterally at 80 mm. Heels elevated on bed with rolled towels. No evidence of DVT. Negative Homan. Physical therapy started on day #1 for gait training and transfer. OT started day #1 for ADL and assisted devices.  Patient's IV was d/c on POD1, Foley was removed following surgery.  Implants: Revision left TKA using all-cemented [employer] Vanguard system with a 70 mm PCR femur, a 67 mm tibial tray with a 14 mm E-poly AS insert, and a 34 x 8.5 mm all-poly 3-pegged domed patella.  He was given perioperative antibiotics:  Anti-infectives (From admission, onward)   Start     Dose/Rate Route Frequency Ordered Stop   06/22/17 1730  ceFAZolin (ANCEF) IVPB 2g/100 mL premix     2 g 200 mL/hr over 30 Minutes Intravenous Every 6 hours 06/22/17 1720 06/23/17 0624   06/22/17 1226  ceFAZolin (ANCEF) 2-4 GM/100ML-% IVPB    Comments:  Trula Slade   : cabinet override      06/22/17 1226 06/22/17 1355   06/21/17 2200  ceFAZolin (ANCEF) IVPB 2g/100 mL premix  2 g 200 mL/hr over 30 Minutes Intravenous  Once 06/21/17 2156 06/22/17 1405    .  He was given sequential compression devices, early ambulation, and Lovenox for DVT prophylaxis.  He benefited maximally from the hospital stay and there were no  complications.    Recent vital signs:  Vitals:   06/24/17 0503 06/24/17 0737  BP: (!) 151/63 (!) 146/48  Pulse: 84 82  Resp: 16 16  Temp: 99 F (37.2 C) 98.7 F (37.1 C)  SpO2: 95% 95%    Recent laboratory studies:  Lab Results  Component Value Date   HGB 10.8 (L) 06/24/2017   HGB 11.4 (L) 06/23/2017   HGB 13.5 06/14/2017   Lab Results  Component Value Date   WBC 11.4 (H) 06/24/2017   PLT 177 06/24/2017   Lab Results  Component Value Date   INR 0.99 06/14/2017   Lab Results  Component Value Date   NA 137 06/24/2017   K 4.3 06/24/2017   CL 103 06/24/2017   CO2 27 06/24/2017   BUN 26 (H) 06/24/2017   CREATININE 1.09 06/24/2017   GLUCOSE 136 (H) 06/24/2017    Discharge Medications:   Allergies as of 06/24/2017   No Known Allergies     Medication List    TAKE these medications   acetaminophen 650 MG CR tablet Commonly known as:  TYLENOL Take 650 mg by mouth 3 (three) times daily.   amLODipine 5 MG tablet Commonly known as:  NORVASC Take 5 mg by mouth daily before breakfast.   aspirin EC 81 MG tablet Take 81 mg by mouth daily.   enoxaparin 40 MG/0.4ML injection Commonly known as:  LOVENOX Inject 0.4 mLs (40 mg total) into the skin daily. Start taking on:  06/25/2017   ferrous sulfate 325 (65 FE) MG tablet Take 325 mg by mouth daily with breakfast.   finasteride 5 MG tablet Commonly known as:  PROSCAR TAKE 1 TABLET (5 MG TOTAL) BY MOUTH ONCE DAILY.   Fish Oil 1200 MG Caps Take 1,200 mg by mouth daily with breakfast.   fluticasone 50 MCG/ACT nasal spray Commonly known as:  FLONASE Place 1-2 sprays into both nostrils daily as needed. For allergies.   levothyroxine 150 MCG tablet Commonly known as:  SYNTHROID, LEVOTHROID Take 150 mcg by mouth daily at 6 (six) AM.   lisinopril 40 MG tablet Commonly known as:  PRINIVIL,ZESTRIL Take 40 mg by mouth daily before breakfast.   mirabegron ER 50 MG Tb24 tablet Commonly known as:  MYRBETRIQ Take 1  tablet (50 mg total) by mouth daily. What changed:    when to take this  additional instructions   OSTEO BI-FLEX ADV TRIPLE ST Tabs Take 1 tablet by mouth 2 (two) times daily.   oxyCODONE 5 MG immediate release tablet Commonly known as:  Oxy IR/ROXICODONE Take 1-2 tablets (5-10 mg total) by mouth every 4 (four) hours as needed for moderate pain ((score 4 to 6)).   simvastatin 20 MG tablet Commonly known as:  ZOCOR Take 20 mg by mouth daily at 8 pm. (2000)   tetrahydrozoline 0.05 % ophthalmic solution Place 1 drop into both eyes daily. Visine Clear   traMADol 50 MG tablet Commonly known as:  ULTRAM Take 50 mg by mouth every 6 (six) hours as needed (for severe pain.).   Vitamin D3 400 units Caps Take 400 Units by mouth daily with supper.            Durable Medical Equipment  (From admission,  onward)        Start     Ordered   06/22/17 1721  DME Walker rolling  Once    Question:  Patient needs a walker to treat with the following condition  Answer:  Status post revision of total knee replacement, left   06/22/17 1720   06/22/17 1721  DME 3 n 1  Once     06/22/17 1720   06/22/17 1721  DME Bedside commode  Once    Question:  Patient needs a bedside commode to treat with the following condition  Answer:  Status post revision of total knee replacement, left   06/22/17 1720      Diagnostic Studies: Dg Chest 2 View  Result Date: 06/14/2017 CLINICAL DATA:  Preoperative evaluation for upcoming knee replacement EXAM: CHEST  2 VIEW COMPARISON:  12/24/2016 FINDINGS: The heart size and mediastinal contours are within normal limits. Both lungs are clear. The visualized skeletal structures are unremarkable. IMPRESSION: No active cardiopulmonary disease. Electronically Signed   By: Alcide Clever M.D.   On: 06/14/2017 15:05   Dg Knee Left Port  Result Date: 06/22/2017 CLINICAL DATA:  84 year old male post right knee replacement. Initial encounter. EXAM: PORTABLE LEFT KNEE - 1-2  VIEW COMPARISON:  12/31/2016 plain film exam. FINDINGS: Post total left knee replacement which appears in satisfactory position without complication noted. Vascular calcifications. IMPRESSION: Post total left knee replacement in satisfactory position. Electronically Signed   By: Lacy Duverney M.D.   On: 06/22/2017 17:31   Disposition: Plan is for discharge home today following physical therapy.  Follow-up Information    Anson [place], PA-C Follow up in 14 day(s).   Specialty:  Physician Assistant Why:  Mindi Slicker information: [address] [place] [address] [phone]          Signed: Meriel Pica PA-C 06/24/2017, 10:37 AM

## 2017-06-24 NOTE — Progress Notes (Signed)
Physical Therapy Treatment Patient Details Name: Richard Castaneda MRN: [id number] DOB: July 14, 1929 Today's Date: 06/24/2017    History of Present Illness admitted status post L TKR revision (06/22/17), WBAT.    PT Comments    Patient with good progression towards all mobility goals, completing all functional mobility with no greater than close sup from therapist.  Intermittent cuing for walker safety, but good control/stability of L knee with all activities.  Patient comfortable with upcoming discharge home; no further questions or concerns at this time.    Follow Up Recommendations  Home health PT     Equipment Recommendations  Rolling walker with 5" wheels    Recommendations for Other Services       Precautions / Restrictions Precautions Precautions: Fall Restrictions Weight Bearing Restrictions: Yes LLE Weight Bearing: Weight bearing as tolerated    Mobility  Bed Mobility Overal bed mobility: Modified Independent             General bed mobility comments: negotiates LE over edge of bed without difficulty  Transfers Overall transfer level: Needs assistance Equipment used: Rolling walker (2 wheeled) Transfers: Sit to/from Stand Sit to Stand: Supervision         General transfer comment: cuing for hand placement, continues to pull up on RW  Ambulation/Gait Ambulation/Gait assistance: Supervision Ambulation Distance (Feet): 200 Feet Assistive device: Rolling walker (2 wheeled)       General Gait Details: 3-point, step to gait pattern with improving stance time/weight acceptance to L LE throughout gait cycle; slow and guarded, but no buckling or LOB.  Mod WBing bilat UEs, requiring single seated rest period due to UE fatigue.   Stairs            Wheelchair Mobility    Modified Rankin (Stroke Patients Only)       Balance Overall balance assessment: Needs assistance Sitting-balance support: No upper extremity supported;Feet supported Sitting  balance-Leahy Scale: Good     Standing balance support: Bilateral upper extremity supported Standing balance-Leahy Scale: Fair                              Cognition Arousal/Alertness: Awake/alert Behavior During Therapy: WFL for tasks assessed/performed Overall Cognitive Status: Within Functional Limits for tasks assessed                                        Exercises Total Joint Exercises Goniometric ROM: L knee, 5-84 degrees (limited by pain, edema) Other Exercises Other Exercises: Supine LE therex, 1x10, AROM: ankle pumps, quad sets, SAQs, heel slides, hip abduct/adduct and SLR.  Good muscle activation/isolation throughout Other Exercises: Toilet transfer, ambulatory with RW, close sup; sit/stand from [facility] with RW, close sup.  Lower body dressing, min assist to thread LEs; close sup for standing balance. Encouraged to maintain single UE support on RW with all standing activities for optimal balance/safety.  Patietn voiced understanding, limited carry-over evident    General Comments        Pertinent Vitals/Pain Pain Assessment: Faces Faces Pain Scale: Hurts little more Pain Location: L knee Pain Descriptors / Indicators: Aching;Grimacing;Guarding Pain Intervention(s): Limited activity within patient's tolerance;Monitored during session;Premedicated before session;Repositioned    Home Living                      Prior Function  PT Goals (current goals can now be found in the care plan section) Acute Rehab PT Goals Patient Stated Goal: to return home PT Goal Formulation: With patient/family Time For Goal Achievement: 07/07/17 Potential to Achieve Goals: Good Progress towards PT goals: Progressing toward goals    Frequency    BID      PT Plan Current plan remains appropriate    Co-evaluation              AM-PAC PT "6 Clicks" Daily Activity  Outcome Measure  Difficulty turning over in bed (including  adjusting bedclothes, sheets and blankets)?: A Little Difficulty moving from lying on back to sitting on the side of the bed? : A Little Difficulty sitting down on and standing up from a chair with arms (e.g., wheelchair, bedside commode, etc,.)?: A Little Help needed moving to and from a bed to chair (including a wheelchair)?: A Little Help needed walking in hospital room?: A Little Help needed climbing 3-5 steps with a railing? : A Little 6 Click Score: 18    End of Session Equipment Utilized During Treatment: Gait belt Activity Tolerance: Patient tolerated treatment well Patient left: in bed;with call bell/phone within reach;with bed alarm set Nurse Communication: Mobility status PT Visit Diagnosis: Unsteadiness on feet (R26.81);Muscle weakness (generalized) (M62.81);Pain;Difficulty in walking, not elsewhere classified (R26.2) Pain - Right/Left: Left Pain - part of body: Knee     Time: [id number] PT Time Calculation (min) (ACUTE ONLY): 43 min  Charges:  $Gait Training: 8-22 mins $Therapeutic Exercise: 8-22 mins $Therapeutic Activity: 8-22 mins                    G Codes:       Ellizabeth Dacruz H. Manson PasseyBrown, PT, DPT, NCS 06/24/17, 1:25 PM [phone]

## 2017-06-27 LAB — AEROBIC/ANAEROBIC CULTURE (SURGICAL/DEEP WOUND): GRAM STAIN: NONE SEEN

## 2017-06-27 LAB — AEROBIC/ANAEROBIC CULTURE W GRAM STAIN (SURGICAL/DEEP WOUND): Culture: NO GROWTH

## 2018-01-28 ENCOUNTER — Telehealth: Payer: Self-pay | Admitting: Urology

## 2018-01-28 NOTE — Telephone Encounter (Signed)
Patient is coming on Monday to pick up samples.

## 2018-01-28 NOTE — Telephone Encounter (Signed)
Pt has reached the donut hole and his Myrbetriq 50 mg RX went up $45, to $104.  He would like to know if he can have samples to last through the end of the year to get him through.  He only has 7 pills left.  Please give pt a call [phone].  If he isn't home, please leave a message.

## 2018-02-08 ENCOUNTER — Telehealth: Payer: Self-pay | Admitting: Urology

## 2018-02-08 NOTE — Telephone Encounter (Signed)
I would think a 107CHEMJI panel would be the same as a BMP, but I would hate to make that assumption and order the wrong test.  If he could tell us the exact labs included in that panel, we may be able to draw them.

## 2018-02-08 NOTE — Telephone Encounter (Signed)
Patient is trying to get Myrbetriq 50mg  through the [place].  They are wanting him to have labs (107CHEMJI panel) prior to giving him the prescription.  Do you know what this is?  Is it something that we can order?  Patient has a follow up appointment in December.  I offered to give him samples until this appointment.  He can be reached ar [phone].

## 2018-02-09 NOTE — Telephone Encounter (Signed)
LMOM for patient to get the information on the panel and we will be happy to get the labs done.

## 2018-02-17 NOTE — Telephone Encounter (Signed)
Mikle Bosworth from [facility] called to let you know he figured out which labs pt needs and he'll have them drawn at the [place].

## 2018-02-25 ENCOUNTER — Telehealth: Payer: Self-pay | Admitting: Urology

## 2018-02-25 NOTE — Telephone Encounter (Signed)
Please call pt and let him know he can come get his samples.

## 2018-02-25 NOTE — Telephone Encounter (Signed)
Pt would like some more samples of Myrbetriq 50 mg.  He is in the donut hole w/insurance company and only has 6 pills left.  Can he get more?  Please call pt [phone]

## 2018-03-08 ENCOUNTER — Telehealth: Payer: Self-pay | Admitting: Urology

## 2018-03-08 NOTE — Telephone Encounter (Signed)
Someone named Mikle Bosworth from the [place] called and left a message and wants a call back he has questions about the patient's medications? Did not say which one's. He can be reached @ [phone]   [facility]

## 2018-03-10 NOTE — Telephone Encounter (Signed)
I spoke with [name] from [place]. He wanted to know if pt has tried alternatives to [employer]. Pt has tried Oxybutynin in past, but I see no notes of any other medication.

## 2018-04-19 NOTE — Progress Notes (Signed)
Error

## 2018-04-20 ENCOUNTER — Ambulatory Visit (INDEPENDENT_AMBULATORY_CARE_PROVIDER_SITE_OTHER): Payer: Medicare Other | Admitting: Urology

## 2018-04-20 ENCOUNTER — Encounter: Payer: Self-pay | Admitting: Urology

## 2018-04-20 VITALS — BP 119/66 | HR 65 | Ht 65.0 in | Wt 150.0 lb

## 2018-04-20 DIAGNOSIS — N138 Other obstructive and reflux uropathy: Secondary | ICD-10-CM | POA: Diagnosis not present

## 2018-04-20 DIAGNOSIS — N401 Enlarged prostate with lower urinary tract symptoms: Secondary | ICD-10-CM

## 2018-04-20 LAB — BLADDER SCAN AMB NON-IMAGING

## 2018-04-20 NOTE — Progress Notes (Signed)
04/20/2018 8:30 PM   Richard Castaneda 03/26/1930 [id number]  Referring provider: Marisue IvanLinthavong, Kanhka, MD [phone] [facility] [address] [facility] [place], [place] [id number]  Chief Complaint  Patient presents with  . Follow-up  . Benign Prostatic Hypertrophy   HPI: 84 yo WM with BPH with LUTS, nocturia and OAB who presents today for a 1 year follow up.     Patient presented on 01/11/2017 with worsening urinary symptoms after a knee surgery with her daughter, Richard Castaneda.    BPH WITH LUTS  (prostate and/or bladder) His IPSS score today is 12/1, which is moderate lower urinary tract symptomatology.  He is mostly satisfied with his quality life due to his urinary symptoms.  His PVR is 0 mL. His BP is 119/66.  His previous PVR was 171 mL.  His previous I PSS score was 13/3. His UA from 01/11/2017 negative.  His major complaints on 04/19/2017 were intermittency and nocturia.   Due to an increase in frequency and nocturia, we increased his Myrbetriq from 25 mg to 50 mg daily.  He had these symptoms for since his knee surgery on 12/31/2016.  He had tried to cut fluids after 5 pm, but he has not seen any improvement.   He had seen an increase in pedal edema since his surgery, but he states it is improving. He also had a Foley catheter placed during his knee replacement surgery.  He denies any dysuria, hematuria or suprapubic pain.   He currently taking Myrbetriq 50 mg and finasteride 5 mg daily.    His has had a cystoscopy on 11/26/2016 which showed an enlarged prostate Only 2-3 cm in length. Minimally obstructive.    He also denied any recent fevers, chills, nausea or vomiting.  Paternal uncle with prostate cancer.    IPSS    Row Name 04/20/18 1100         International Prostate Symptom Score   How often have you had the sensation of not emptying your bladder?  Less than 1 in 5     How often have you had to urinate less than every two hours?  Less than 1 in 5 times     How often have you  found you stopped and started again several times when you urinated?  Less than half the time     How often have you found it difficult to postpone urination?  Less than half the time     How often have you had a weak urinary stream?  Less than half the time     How often have you had to strain to start urination?  Less than half the time     How many times did you typically get up at night to urinate?  2 Times     Total IPSS Score  12       Quality of Life due to urinary symptoms   If you were to spend the rest of your life with your urinary condition just the way it is now how would you feel about that?  Pleased       Score:  1-7 Mild 8-19 Moderate 20-35 Severe  PMH: Past Medical History:  Diagnosis Date  . Acquired hypothyroidism 11/16/2016  . Anterior myocardial infarction (HCC) 12/29/2013   Overview:  2004  . Benign essential HTN 12/29/2013  . Benign prostatic hyperplasia with urinary frequency 10/19/2016  . Borderline diabetes mellitus 11/16/2016  . Coronary artery disease 12/29/2013   Overview:  Anterior MI, PCI  and stent placement of LAD11/04  . H/O iron deficiency anemia 09/09/2015  . History of BPH   . Inguinal hernia, bilateral   . Moderate tricuspid insufficiency 09/12/2014  . Occasional tremors    Left hand only with writing and eating  . Pre-diabetes   . Primary osteoarthritis of left knee 08/26/2016  . Pure hypercholesterolemia 12/29/2013  . PVD (peripheral vascular disease) (HCC) 12/29/2013   Overview:  With carotid atherosclerosis     Surgical History: Past Surgical History:  Procedure Laterality Date  . CATARACT EXTRACTION W/ INTRAOCULAR LENS  IMPLANT, BILATERAL    . CORONARY ANGIOPLASTY  2004   1 stent  . EYE SURGERY Bilateral    Cataract Extraction with IOL  . HERNIA REPAIR Bilateral    Inguinal Hernia Repair  . PARTIAL KNEE ARTHROPLASTY Left 12/31/2016   Procedure: UNICOMPARTMENTAL KNEE;  Surgeon: Christena Flake, MD;  Location: [facility] ORS;  Service:  Orthopedics;  Laterality: Left;  . TOTAL KNEE REVISION Left 06/22/2017   Procedure: TOTAL KNEE REVISION, CONVERTING A PARTIAL KNEE TO A TOTAL;  Surgeon: Christena Flake, MD;  Location: [facility] ORS;  Service: Orthopedics;  Laterality: Left;    Home Medications:  Allergies as of 04/20/2018   No Known Allergies     Medication List       Accurate as of April 20, 2018 11:59 PM. Always use your most recent med list.        acetaminophen 650 MG CR tablet Commonly known as:  TYLENOL Take 650 mg by mouth 3 (three) times daily.   amLODipine 5 MG tablet Commonly known as:  NORVASC Take 5 mg by mouth daily before breakfast.   aspirin EC 81 MG tablet Take 81 mg by mouth daily.   ferrous sulfate 325 (65 FE) MG tablet Take 325 mg by mouth daily with breakfast.   finasteride 5 MG tablet Commonly known as:  PROSCAR TAKE 1 TABLET (5 MG TOTAL) BY MOUTH ONCE DAILY.   Fish Oil 1200 MG Caps Take 1,200 mg by mouth daily with breakfast.   fluticasone 50 MCG/ACT nasal spray Commonly known as:  FLONASE Place 1-2 sprays into both nostrils daily as needed. For allergies.   levothyroxine 150 MCG tablet Commonly known as:  SYNTHROID, LEVOTHROID Take 150 mcg by mouth daily at 6 (six) AM.   lisinopril 40 MG tablet Commonly known as:  PRINIVIL,ZESTRIL Take 40 mg by mouth daily before breakfast.   mirabegron ER 50 MG Tb24 tablet Commonly known as:  MYRBETRIQ Take 1 tablet (50 mg total) by mouth daily.   OSTEO BI-FLEX ADV TRIPLE ST Tabs Take 1 tablet by mouth 2 (two) times daily.   simvastatin 20 MG tablet Commonly known as:  ZOCOR Take 20 mg by mouth daily at 8 pm. (2000)   tetrahydrozoline 0.05 % ophthalmic solution Place 1 drop into both eyes daily. Visine Clear   Vitamin D3 10 MCG (400 UNIT) Caps Take 400 Units by mouth daily with supper.       Allergies: No Known Allergies  Family History: Family History  Problem Relation Age of Onset  . Kidney failure Mother   . Prostate  cancer Paternal Uncle   . Bladder Cancer Neg Hx   . Kidney cancer Neg Hx     Social History:  reports that he has never smoked. He has never used smokeless tobacco. He reports that he does not drink alcohol or use drugs.  ROS: UROLOGY Frequent Urination?: No Hard to postpone urination?: No Burning/pain with  urination?: No Get up at night to urinate?: No Leakage of urine?: No Urine stream starts and stops?: No Trouble starting stream?: No Do you have to strain to urinate?: No Blood in urine?: No Urinary tract infection?: No Sexually transmitted disease?: No Injury to kidneys or bladder?: No Painful intercourse?: No Weak stream?: No Erection problems?: No Penile pain?: No  Gastrointestinal Nausea?: No Vomiting?: No Indigestion/heartburn?: No Diarrhea?: No Constipation?: No  Constitutional Fever: No Night sweats?: No Fatigue?: No  Skin Skin rash/lesions?: No Itching?: No  Eyes Blurred vision?: No Double vision?: No  Ears/Nose/Throat Sore throat?: No Sinus problems?: No  Hematologic/Lymphatic Swollen glands?: No Easy bruising?: No  Cardiovascular Leg swelling?: No Chest pain?: No  Respiratory Cough?: No  Endocrine Excessive thirst?: No  Musculoskeletal Back pain?: No Joint pain?: No  Neurological Headaches?: No Dizziness?: No  Psychologic Depression?: No Anxiety?: No  Physical Exam: BP 119/66 (BP Location: Left Arm, Patient Position: Sitting)   Pulse 65   Ht 5\' 5"  (1.651 m)   Wt 150 lb (68 kg)   BMI 24.96 kg/m    Laboratory Data:  I have reviewed the labs.  Pertinent Imaging: Results for orders placed or performed in visit on 04/20/18  Bladder Scan (Post Void Residual) in office  Result Value Ref Range   Scan Result 0 ml     Assessment & Plan:    1. BPH with LUTS  - IPSS score is 12/1, it is improved  - patient left without being seen   Return for will call patient to reschedule .  These notes generated with voice  recognition software. I apologize for typographical errors.  Michiel Cowboy, PA-C  [facility] [address] [place], [place] [address] [phone]  I, Donne Hazel, am acting as a Neurosurgeon for [employer],  I have reviewed the above documentation for accuracy and completeness, and I agree with the above.    Michiel Cowboy, PA-C

## 2018-04-24 ENCOUNTER — Telehealth: Payer: Self-pay | Admitting: Urology

## 2018-04-24 NOTE — Telephone Encounter (Signed)
Please call patient to reschedule his appointment.  He had to leave due to having another appointment.

## 2018-04-25 NOTE — Telephone Encounter (Signed)
App made ° ° °Richard Castaneda  °

## 2018-05-17 ENCOUNTER — Telehealth: Payer: Self-pay | Admitting: Urology

## 2018-05-17 NOTE — Telephone Encounter (Signed)
The medication is Myrbetriq 50mg 

## 2018-05-17 NOTE — Telephone Encounter (Signed)
Pt states that he will run out of samples [facility]. Requests a call back.

## 2018-05-17 NOTE — Telephone Encounter (Signed)
Spoke with patient and samples of 50mg  left up front

## 2018-05-27 DIAGNOSIS — I6523 Occlusion and stenosis of bilateral carotid arteries: Secondary | ICD-10-CM | POA: Insufficient documentation

## 2018-06-08 ENCOUNTER — Ambulatory Visit: Payer: Medicare Other | Admitting: Urology

## 2018-06-08 ENCOUNTER — Encounter: Payer: Self-pay | Admitting: Urology

## 2018-06-08 VITALS — BP 125/71 | HR 62 | Ht 65.0 in | Wt 155.0 lb

## 2018-06-08 DIAGNOSIS — N401 Enlarged prostate with lower urinary tract symptoms: Secondary | ICD-10-CM | POA: Diagnosis not present

## 2018-06-08 DIAGNOSIS — N138 Other obstructive and reflux uropathy: Secondary | ICD-10-CM | POA: Diagnosis not present

## 2018-06-08 LAB — BLADDER SCAN AMB NON-IMAGING

## 2018-06-08 MED ORDER — MIRABEGRON ER 50 MG PO TB24: 50.0000 mg | ORAL_TABLET | Freq: Every day | ORAL | 11 refills | Status: DC

## 2018-06-08 MED ORDER — FINASTERIDE 5 MG PO TABS: ORAL_TABLET | ORAL | 11 refills | Status: DC

## 2018-06-08 NOTE — Progress Notes (Signed)
04/20/2018 11:10 AM   Richard Castaneda [birth year] [id number]  Referring provider: Marisue Ivan, MD [phone] [facility] [address] [facility] [place], [place] [address]  Chief Complaint  Patient presents with  . Benign Prostatic Hypertrophy   HPI: 85 yo WM with BPH with LUTS, nocturia and OAB who presents today for a 1 year follow up.     Patient presented on 01/11/2017 with worsening urinary symptoms after a knee surgery with her daughter, [name].    BPH WITH LUTS  (prostate and/or bladder) His IPSS score today is 7/1, which is moderate lower urinary tract symptomatology.  He is mostly satisfied with his quality life due to his urinary symptoms.  His PVR is 3 mL. His BP is 125/71.  His previous PVR was 0 mL.  His previous I PSS score was 12/1.   He is currently taking Myrbetriq 50 mg and finasteride 5 mg dialy.   He reports of quitting coffee. He stated that if he drinks caffeine free diet pepsi during lunch, he drinks water during dinner and vice versa.  He denies drinking juice and alcohol.  Previous history:  His major complaints on 04/19/2017 were intermittency and nocturia.   Due to an increase in frequency and nocturia, we increased his Myrbetriq from 25 mg to 50 mg daily.  He had these symptoms for since his knee surgery on 12/31/2016.  He had tried to cut fluids after 5 pm, but he has not seen any improvement. He had seen an increase in pedal edema since his surgery, but he states it is improving. He also had a Foley catheter placed during his knee replacement surgery.  His has had a cystoscopy on 11/26/2016 which showed an enlarged prostate Only 2-3 cm in length. Minimally obstructive. Paternal uncle with prostate cancer.    IPSS    Row Name 06/08/18 1000         International Prostate Symptom Score   How often have you had the sensation of not emptying your bladder?  Not at All     How often have you had to urinate less than every two hours?  Less than half the  time     How often have you found you stopped and started again several times when you urinated?  Less than half the time     How often have you found it difficult to postpone urination?  Not at All     How often have you had a weak urinary stream?  Not at All     How often have you had to strain to start urination?  Less than 1 in 5 times     How many times did you typically get up at night to urinate?  2 Times     Total IPSS Score  7       Quality of Life due to urinary symptoms   If you were to spend the rest of your life with your urinary condition just the way it is now how would you feel about that?  Pleased        Score:  1-7 Mild 8-19 Moderate 20-35 Severe   PMH: Past Medical History:  Diagnosis Date  . Acquired hypothyroidism 11/16/2016  . Anterior myocardial infarction (HCC) 12/29/2013   Overview:  2004  . Benign essential HTN 12/29/2013  . Benign prostatic hyperplasia with urinary frequency 10/19/2016  . Borderline diabetes mellitus 11/16/2016  . Coronary artery disease 12/29/2013   Overview:  Anterior MI, PCI and  stent placement of LAD11/04  . H/O iron deficiency anemia 09/09/2015  . History of BPH   . Inguinal hernia, bilateral   . Moderate tricuspid insufficiency 09/12/2014  . Occasional tremors    Left hand only with writing and eating  . Pre-diabetes   . Primary osteoarthritis of left knee 08/26/2016  . Pure hypercholesterolemia 12/29/2013  . PVD (peripheral vascular disease) (HCC) 12/29/2013   Overview:  With carotid atherosclerosis     Surgical History: Past Surgical History:  Procedure Laterality Date  . CATARACT EXTRACTION W/ INTRAOCULAR LENS  IMPLANT, BILATERAL    . CORONARY ANGIOPLASTY  2004   1 stent  . EYE SURGERY Bilateral    Cataract Extraction with IOL  . HERNIA REPAIR Bilateral    Inguinal Hernia Repair  . PARTIAL KNEE ARTHROPLASTY Left 12/31/2016   Procedure: UNICOMPARTMENTAL KNEE;  Surgeon: Christena FlakePoggi, John J, MD;  Location: [facility] ORS;  Service:  Orthopedics;  Laterality: Left;  . TOTAL KNEE REVISION Left 06/22/2017   Procedure: TOTAL KNEE REVISION, CONVERTING A PARTIAL KNEE TO A TOTAL;  Surgeon: Christena FlakePoggi, John J, MD;  Location: [facility] ORS;  Service: Orthopedics;  Laterality: Left;    Home Medications:  Allergies as of 06/08/2018   No Known Allergies     Medication List       Accurate as of June 08, 2018 11:10 AM. Always use your most recent med list.        acetaminophen 650 MG CR tablet Commonly known as:  TYLENOL Take 650 mg by mouth 3 (three) times daily.   amLODipine 5 MG tablet Commonly known as:  NORVASC Take 5 mg by mouth daily before breakfast.   amoxicillin 500 MG capsule Commonly known as:  AMOXIL TAKE 4 CAPSULES ONE HOUR PRIOR TO DENTAL APPOINTMENT.   aspirin EC 81 MG tablet Take 81 mg by mouth daily.   ferrous sulfate 325 (65 FE) MG tablet Take 325 mg by mouth daily with breakfast.   finasteride 5 MG tablet Commonly known as:  PROSCAR TAKE 1 TABLET (5 MG TOTAL) BY MOUTH ONCE DAILY.   Fish Oil 1200 MG Caps Take 1,200 mg by mouth daily with breakfast.   fluticasone 50 MCG/ACT nasal spray Commonly known as:  FLONASE Place 1-2 sprays into both nostrils daily as needed. For allergies.   levothyroxine 150 MCG tablet Commonly known as:  SYNTHROID, LEVOTHROID Take 150 mcg by mouth daily at 6 (six) AM.   lisinopril 40 MG tablet Commonly known as:  PRINIVIL,ZESTRIL Take 40 mg by mouth daily before breakfast.   mirabegron ER 50 MG Tb24 tablet Commonly known as:  MYRBETRIQ Take 1 tablet (50 mg total) by mouth daily.   OSTEO BI-FLEX ADV TRIPLE ST Tabs Take 1 tablet by mouth 2 (two) times daily.   simvastatin 20 MG tablet Commonly known as:  ZOCOR Take 20 mg by mouth daily at 8 pm. (2000)   tetrahydrozoline 0.05 % ophthalmic solution Place 1 drop into both eyes daily. Visine Clear   Vitamin D3 10 MCG (400 UNIT) Caps Take 400 Units by mouth daily with supper.       Allergies: No Known  Allergies  Family History: Family History  Problem Relation Age of Onset  . Kidney failure Mother   . Prostate cancer Paternal Uncle   . Bladder Cancer Neg Hx   . Kidney cancer Neg Hx     Social History:  reports that he has never smoked. He has never used smokeless tobacco. He reports that he does  not drink alcohol or use drugs.  ROS: UROLOGY Frequent Urination?: No Hard to postpone urination?: No Burning/pain with urination?: No Get up at night to urinate?: Yes Leakage of urine?: Yes Urine stream starts and stops?: No Trouble starting stream?: No Do you have to strain to urinate?: No Blood in urine?: No Urinary tract infection?: No Sexually transmitted disease?: No Injury to kidneys or bladder?: No Painful intercourse?: No Weak stream?: No Erection problems?: No Penile pain?: No  Gastrointestinal Nausea?: No Vomiting?: No Indigestion/heartburn?: No Diarrhea?: No Constipation?: No  Constitutional Fever: No Night sweats?: No Weight loss?: No Fatigue?: No  Skin Skin rash/lesions?: No Itching?: No  Eyes Blurred vision?: No Double vision?: No  Ears/Nose/Throat Sore throat?: No Sinus problems?: No  Hematologic/Lymphatic Swollen glands?: No Easy bruising?: Yes  Cardiovascular Leg swelling?: No Chest pain?: No  Respiratory Cough?: No Shortness of breath?: No  Endocrine Excessive thirst?: No  Musculoskeletal Back pain?: No Joint pain?: No  Neurological Headaches?: No Dizziness?: No  Psychologic Depression?: Yes Anxiety?: No  Physical Exam: BP 125/71 (BP Location: Left Arm, Patient Position: Sitting)   Pulse 62   Ht 5\' 5"  (1.651 m) Comment: per pt  Wt 155 lb (70.3 kg)   BMI 25.79 kg/m   Constitu tional:  Well nourished. Alert and oriented, No acute distress. HEENT: [place] AT, moist mucus membranes.  Trachea midline, no masses. Cardiovascular: No clubbing, cyanosis, or edema. Respiratory: Normal respiratory effort, no increased work of  breathing. GU: No CVA tenderness.  No bladder fullness or masses.  Patient with uncircumcised phallus. Foreskin easily retracted. Urethral meatus is patent.  No penile discharge. No penile lesions or rashes. Scrotum without lesions, cysts, rashes and/or edema.  Testicles are located scrotally bilaterally. Hydrocele on left and no hydrocele appreciated on right. No masses are appreciated in the testicles. Left and right epididymis are normal.  Rectal: Patient with  normal sphincter tone. Anus and perineum without scarring or rashes. No rectal masses are appreciated. Prostate is approximately 45 grams, no nodules are appreciated. Seminal vesicles are normal. Skin: No rashes, bruises or suspicious lesions. Neurologic: Grossly intact, no focal deficits, moving all 4 extremities. Psychiatric: Normal mood and affect.  Pertinent Imaging: Results for orders placed or performed in visit on 06/08/18  BLADDER SCAN AMB NON-IMAGING  Result Value Ref Range   Scan Result 3ml     Assessment & Plan:    1. BPH with LUTS  - IPSS score is 7/1, it is improved  - PVR is 0 mL, voiding well   - Continue Myrbetriq 50 mg and finasteride 5 mig; refills given  - Given 4 Myrbetriq 50 mg daily, # 28 samples; I have advised the patient of the side effects of Myrbetriq, such as: elevation in BP, urinary retention and/or HA   - RTC in 1 year for IPSS, PVR, and exam  Return in about 1 year (around 06/09/2019) for IPSS/PVR/exam.  Michiel CowboySHANNON Johana Hopkinson, [facility]  [facility] [address] [place], [place] [address] [phone]  I, Donne HazelNethusan Sivanesan, am acting as a Neurosurgeonscribe for [employer] Edder Bellanca PA-C,  I have reviewed the above documentation for accuracy and completeness, and I agree with the above.    Michiel CowboyShannon Treazure Nery, PA-C

## 2018-09-01 DIAGNOSIS — N183 Chronic kidney disease, stage 3 unspecified: Secondary | ICD-10-CM | POA: Insufficient documentation

## 2018-10-03 DIAGNOSIS — M47816 Spondylosis without myelopathy or radiculopathy, lumbar region: Secondary | ICD-10-CM | POA: Insufficient documentation

## 2019-03-13 ENCOUNTER — Telehealth: Payer: Self-pay | Admitting: Urology

## 2019-03-13 NOTE — Telephone Encounter (Signed)
We can give him 4 weeks of samples, that is 28 pills.  He can take it every other day to stretch it out until he is out of the donut hole.  I will leave the samples up front for him.

## 2019-03-13 NOTE — Telephone Encounter (Signed)
Pt states he's now in something called a donut hole with his prescriptions, his Rx for Mybteriq will now cost him over $90/month, pt asks if he can get samples from office until after the 1st of next year. Please advise at [phone].

## 2019-03-13 NOTE — Telephone Encounter (Signed)
Patient notified and samples are at front desk.

## 2019-06-09 ENCOUNTER — Other Ambulatory Visit: Payer: Self-pay

## 2019-06-09 ENCOUNTER — Ambulatory Visit: Payer: Medicare Other | Admitting: Urology

## 2019-06-09 ENCOUNTER — Encounter: Payer: Self-pay | Admitting: Urology

## 2019-06-09 VITALS — BP 112/65 | HR 67 | Ht 65.0 in | Wt 151.0 lb

## 2019-06-09 DIAGNOSIS — N138 Other obstructive and reflux uropathy: Secondary | ICD-10-CM

## 2019-06-09 DIAGNOSIS — N401 Enlarged prostate with lower urinary tract symptoms: Secondary | ICD-10-CM | POA: Diagnosis not present

## 2019-06-09 LAB — BLADDER SCAN AMB NON-IMAGING: Scan Result: 0

## 2019-06-09 MED ORDER — MIRABEGRON ER 50 MG PO TB24: 50.0000 mg | ORAL_TABLET | Freq: Every day | ORAL | 3 refills | Status: DC

## 2019-06-09 NOTE — Progress Notes (Signed)
04/20/2018 11:13 AM   Richard Castaneda 06/20/1929 [id number]  Referring provider: Dion Body, MD [facility] [place],  [place] [address]  Chief Complaint  Patient presents with  . Benign Prostatic Hypertrophy   HPI: 86 yo male with BPH with LUTS, nocturia and OAB who presents today for a 1 year follow up.     Patient presented on 01/11/2017 with worsening urinary symptoms after a knee surgery with her daughter, [name].    BPH WITH LUTS  (prostate and/or bladder) His IPSS score today is 6, which is mild lower urinary tract symptomatology.  He is mostly satisfied with his quality life due to his urinary symptoms.  His PVR is 0 mL. His BP is 125/71.  His previous PVR was 3 mL.  His previous I PSS score was 7/1.   He is currently taking Myrbetriq 50 mg and finasteride 5 mg dialy.   His has had a cystoscopy on 11/26/2016 which showed an enlarged prostate Only 2-3 cm in length. Minimally obstructive.  Paternal uncle with prostate cancer.    IPSS    Row Name 06/09/19 1000         International Prostate Symptom Score   How often have you had the sensation of not emptying your bladder?  Not at All     How often have you had to urinate less than every two hours?  Less than half the time     How often have you found you stopped and started again several times when you urinated?  Not at All     How often have you found it difficult to postpone urination?  Not at All     How often have you had a weak urinary stream?  Less than 1 in 5 times     How often have you had to strain to start urination?  Not at All     How many times did you typically get up at night to urinate?  3 Times     Total IPSS Score  6       Quality of Life due to urinary symptoms   If you were to spend the rest of your life with your urinary condition just the way it is now how would you feel about that?  Mostly Satisfied        Score:  1-7 Mild 8-19 Moderate 20-35  Severe   PMH: Past Medical History:  Diagnosis Date  . Acquired hypothyroidism 11/16/2016  . Anterior myocardial infarction ([place]) 12/29/2013   Overview:  2004  . Benign essential HTN 12/29/2013  . Benign prostatic hyperplasia with urinary frequency 10/19/2016  . Borderline diabetes mellitus 11/16/2016  . Coronary artery disease 12/29/2013   Overview:  Anterior MI, PCI and stent placement of LAD11/04  . H/O iron deficiency anemia 09/09/2015  . History of BPH   . Inguinal hernia, bilateral   . Moderate tricuspid insufficiency 09/12/2014  . Occasional tremors    Left hand only with writing and eating  . Pre-diabetes   . Primary osteoarthritis of left knee 08/26/2016  . Pure hypercholesterolemia 12/29/2013  . PVD (peripheral vascular disease) ([place]) 12/29/2013   Overview:  With carotid atherosclerosis     Surgical History: Past Surgical History:  Procedure Laterality Date  . CATARACT EXTRACTION W/ INTRAOCULAR LENS  IMPLANT, BILATERAL    . CORONARY ANGIOPLASTY  2004   1 stent  . EYE SURGERY Bilateral    Cataract Extraction with IOL  .  HERNIA REPAIR Bilateral    Inguinal Hernia Repair  . PARTIAL KNEE ARTHROPLASTY Left 12/31/2016   Procedure: UNICOMPARTMENTAL KNEE;  Surgeon: Christena Flake, MD;  Location: [facility] ORS;  Service: Orthopedics;  Laterality: Left;  . TOTAL KNEE REVISION Left 06/22/2017   Procedure: TOTAL KNEE REVISION, CONVERTING A PARTIAL KNEE TO A TOTAL;  Surgeon: Christena Flake, MD;  Location: [facility] ORS;  Service: Orthopedics;  Laterality: Left;    Home Medications:  Allergies as of 06/09/2019   No Known Allergies     Medication List       Accurate as of June 09, 2019 11:12 AM. If you have any questions, ask your nurse or doctor.        acetaminophen 650 MG CR tablet Commonly known as: TYLENOL Take 650 mg by mouth 3 (three) times daily.   amLODipine 5 MG tablet Commonly known as: NORVASC Take 5 mg by mouth daily before breakfast.   amoxicillin 500 MG  capsule Commonly known as: AMOXIL TAKE 4 CAPSULES ONE HOUR PRIOR TO DENTAL APPOINTMENT.   aspirin EC 81 MG tablet Take 81 mg by mouth daily.   ferrous sulfate 325 (65 FE) MG tablet Take 325 mg by mouth daily with breakfast.   finasteride 5 MG tablet Commonly known as: PROSCAR TAKE 1 TABLET (5 MG TOTAL) BY MOUTH ONCE DAILY.   Fish Oil 1200 MG Caps Take 1,200 mg by mouth daily with breakfast.   fluticasone 50 MCG/ACT nasal spray Commonly known as: FLONASE Place 1-2 sprays into both nostrils daily as needed. For allergies.   levothyroxine 150 MCG tablet Commonly known as: SYNTHROID Take 150 mcg by mouth daily at 6 (six) AM.   lisinopril 40 MG tablet Commonly known as: ZESTRIL Take 40 mg by mouth daily before breakfast.   mirabegron ER 50 MG Tb24 tablet Commonly known as: MYRBETRIQ Take 1 tablet (50 mg total) by mouth daily.   Osteo Bi-Flex Adv Triple St Tabs Take 1 tablet by mouth 2 (two) times daily.   simvastatin 20 MG tablet Commonly known as: ZOCOR Take 20 mg by mouth daily at 8 pm. (2000)   tetrahydrozoline 0.05 % ophthalmic solution Place 1 drop into both eyes daily. Visine Clear   Vitamin D3 10 MCG (400 UNIT) Caps Take 400 Units by mouth daily with supper.       Allergies: No Known Allergies  Family History: Family History  Problem Relation Age of Onset  . Kidney failure Mother   . Prostate cancer Paternal Uncle   . Bladder Cancer Neg Hx   . Kidney cancer Neg Hx     Social History:  reports that he has never smoked. He has never used smokeless tobacco. He reports that he does not drink alcohol or use drugs.  ROS: UROLOGY Frequent Urination?: No Hard to postpone urination?: No Burning/pain with urination?: No Get up at night to urinate?: Yes Leakage of urine?: No Urine stream starts and stops?: No Trouble starting stream?: No Do you have to strain to urinate?: No Blood in urine?: No Urinary tract infection?: No Sexually transmitted  disease?: No Injury to kidneys or bladder?: No Painful intercourse?: No Weak stream?: No Erection problems?: No Penile pain?: No  Gastrointestinal Nausea?: No Vomiting?: No Indigestion/heartburn?: No Diarrhea?: No Constipation?: No  Constitutional Fever: No Night sweats?: No Weight loss?: No Fatigue?: No  Skin Skin rash/lesions?: No Itching?: No  Eyes Blurred vision?: No Double vision?: No  Ears/Nose/Throat Sore throat?: No Sinus problems?: No  Hematologic/Lymphatic Swollen glands?:  No Easy bruising?: Yes  Cardiovascular Leg swelling?: No Chest pain?: No  Respiratory Cough?: No Shortness of breath?: No  Endocrine Excessive thirst?: No  Musculoskeletal Back pain?: Yes Joint pain?: Yes  Neurological Headaches?: No Dizziness?: No  Psychologic Depression?: No Anxiety?: No  Physical Exam: BP 112/65   Pulse 67   Ht 5\' 5"  (1.651 m)   Wt 151 lb (68.5 kg)   BMI 25.13 kg/m   Constitutional:  Well nourished. Alert and oriented, No acute distress. HEENT: [place] AT, mask in place.  Trachea midline, no masses. Cardiovascular: No clubbing, cyanosis, or edema. Respiratory: Normal respiratory effort, no increased work of breathing. GI: Abdomen is soft, non tender, non distended, no abdominal masses. Liver and spleen not palpable.  No hernias appreciated.  Stool sample for occult testing is not indicated.   GU: No CVA tenderness.  No bladder fullness or masses.  Patient with uncircumcised phallus.  Foreskin easily retracted.  Crusted smegma present.    Urethral meatus is patent.  No penile discharge. No penile lesions or rashes. Scrotum without lesions, cysts, rashes and/or edema.  Testicles are located scrotally bilaterally. No masses are appreciated in the testicles. Left and right epididymis are normal. Rectal: Patient with  normal sphincter tone. Anus and perineum without scarring or rashes. No rectal masses are appreciated. Prostate is approximately 45 grams, no  nodules are appreciated. Seminal vesicles could not be palpated.  Skin: No rashes, bruises or suspicious lesions. Lymph: No inguinal adenopathy. Neurologic: Grossly intact, no focal deficits, moving all 4 extremities. Psychiatric: Normal mood and affect.  Pertinent Imaging: Results for EUSEVIO, SCHRIVER (MRN [name]) as of 06/09/2019 11:00  Ref. Range 06/08/2018 10:36  Scan Result Unknown 32ml    Assessment & Plan:    1. BPH with LUTS IPSS score is 6/2, it is stable  Continue Myrbetriq 50 mg and finasteride 5 mig; refills given RTC in 1 year for IPSS, PVR, and exam  Return in about 1 year (around 06/08/2020) for IPSS, PVR and exam.  08/06/2020, [facility]  [facility] [address] [place], [place] [phone]

## 2019-07-10 ENCOUNTER — Other Ambulatory Visit: Payer: Self-pay | Admitting: Physical Medicine and Rehabilitation

## 2019-07-10 DIAGNOSIS — M5416 Radiculopathy, lumbar region: Secondary | ICD-10-CM

## 2019-07-20 ENCOUNTER — Other Ambulatory Visit: Payer: Self-pay

## 2019-07-20 ENCOUNTER — Ambulatory Visit
Admission: RE | Admit: 2019-07-20 | Discharge: 2019-07-20 | Disposition: A | Discharge status: Home / Self Care | Payer: Medicare Other | Source: Ambulatory Visit | Attending: Physical Medicine and Rehabilitation | Admitting: Physical Medicine and Rehabilitation

## 2019-07-20 DIAGNOSIS — M5416 Radiculopathy, lumbar region: Secondary | ICD-10-CM | POA: Insufficient documentation

## 2019-07-20 IMAGING — MR MR LUMBAR SPINE W/O CM
5 series · 31 of 48 positions shown · non-contrast
Comparison: Scout image of CT scan of the abdomen dated [DATE]

CLINICAL DATA: Chronic progressive low back pain and left buttock
and leg pain. Occasional left leg numbness.

EXAM:
MRI LUMBAR SPINE WITHOUT CONTRAST
TECHNIQUE: Multiplanar, multisequence MR imaging of the lumbar spine was
performed. No intravenous contrast was administered.

[Series 9: T2 · sagittal · 4.0mm · 0.81mm/px · 6 of 17 slices shown (1 of 2)]
[im 1/17]
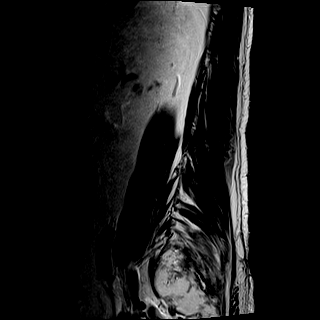
[im 4/17]
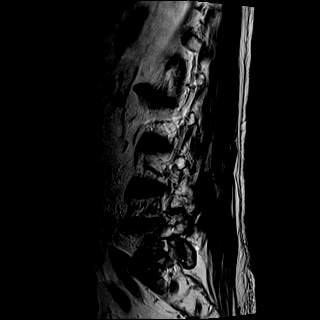
[im 7/17]
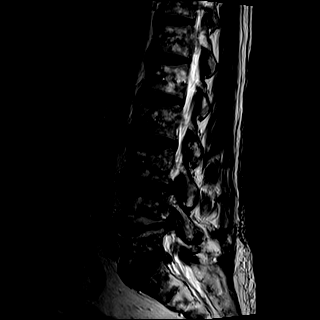
[im 10/17]
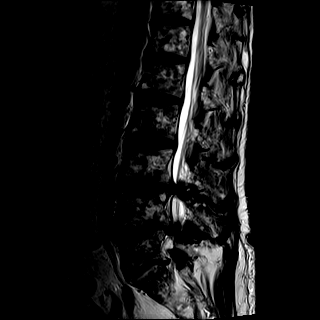
[im 13/17]
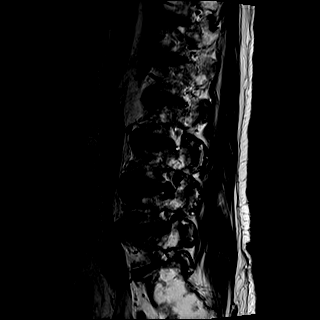
[im 17/17]
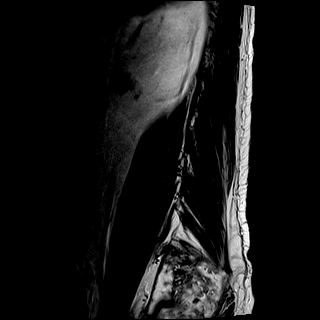

[Series 10: T1 · sagittal · 4.0mm · 0.81mm/px · 7 of 17 slices shown (1 of 2)]
[im 1/17]
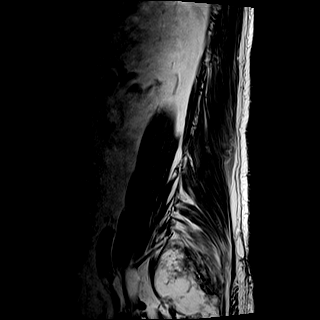
[im 3/17]
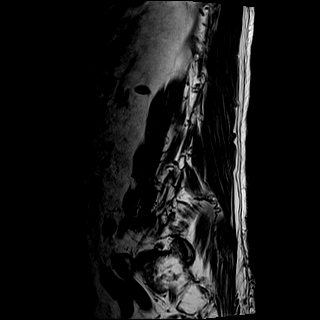
[im 6/17]
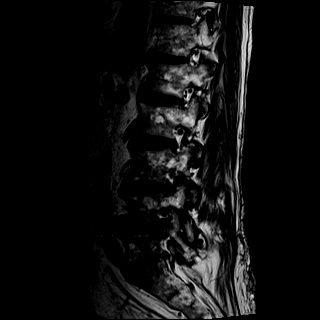
[im 9/17]
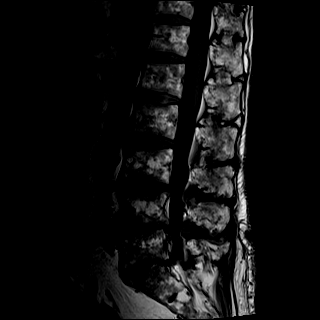
[im 11/17]
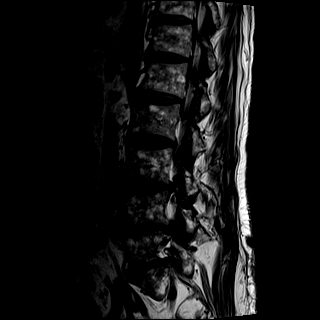
[im 14/17]
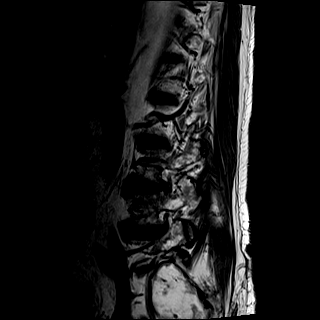
[im 17/17]
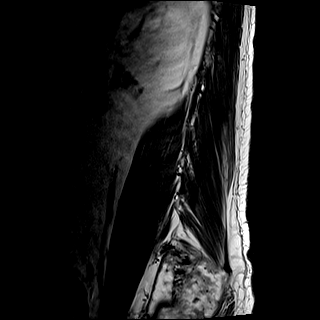

[Series 11: STIR · sagittal · 4.0mm · 0.41mm/px · 2 of 17 slices shown]
[im 1/17]
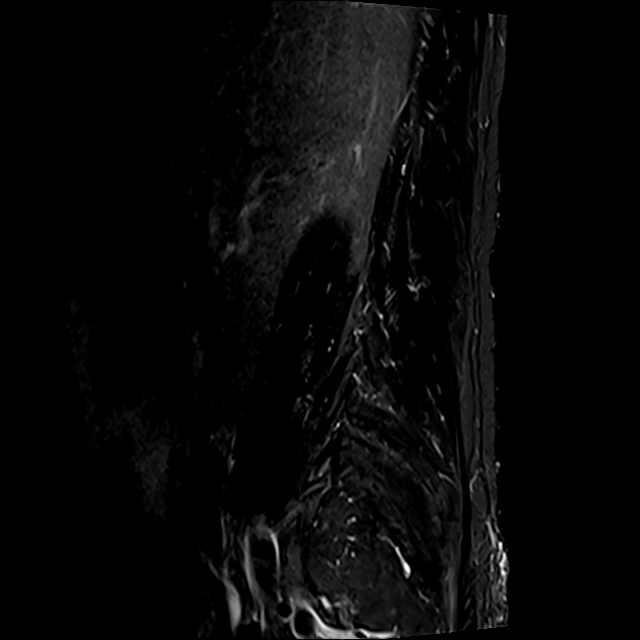
[im 3/17]
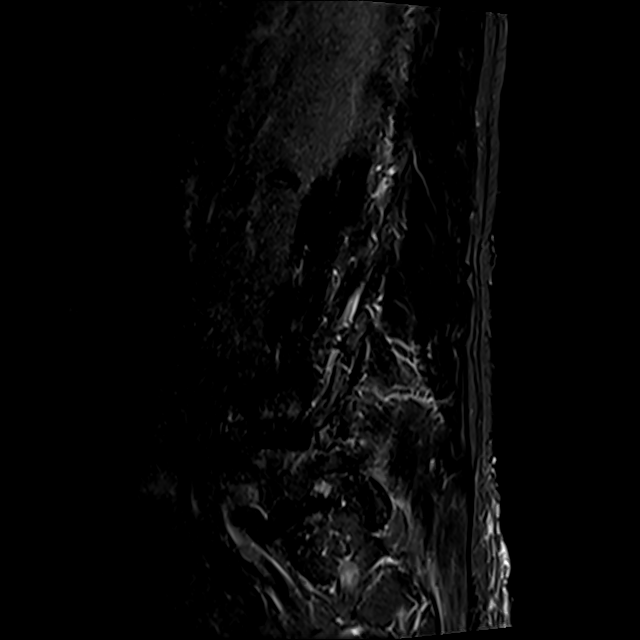

[Series 12: T2 · axial · 4.0mm · 0.78mm/px · z∈[-235,-29]mm · 8 of 37 slices shown (2 of 2)]
[im 1/37]
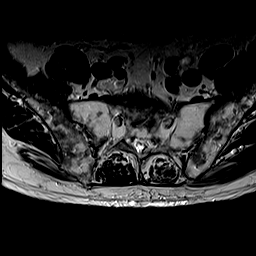
[im 6/37]
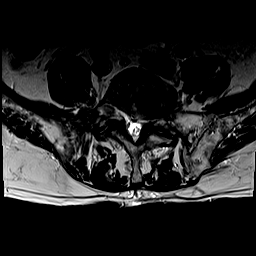
[im 12/37]
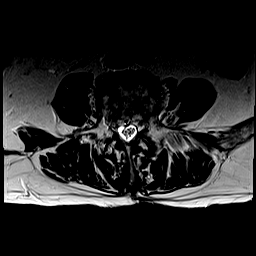
[im 17/37]
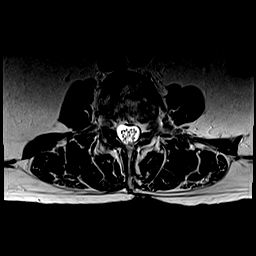
[im 20/37]
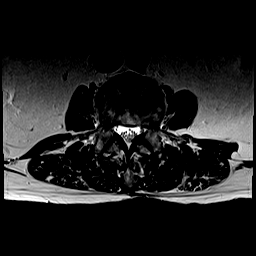
[im 25/37]
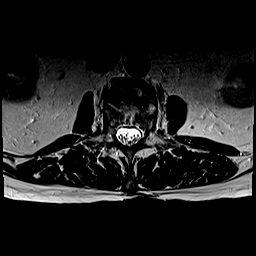
[im 31/37]
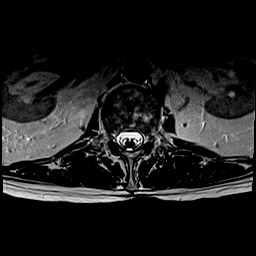
[im 37/37]
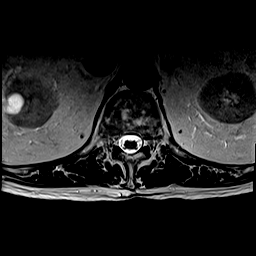

[Series 13: T1 · axial · 4.0mm · 0.39mm/px · z∈[-235,-29]mm · 8 of 37 slices shown (2 of 2)]
[im 1/37]
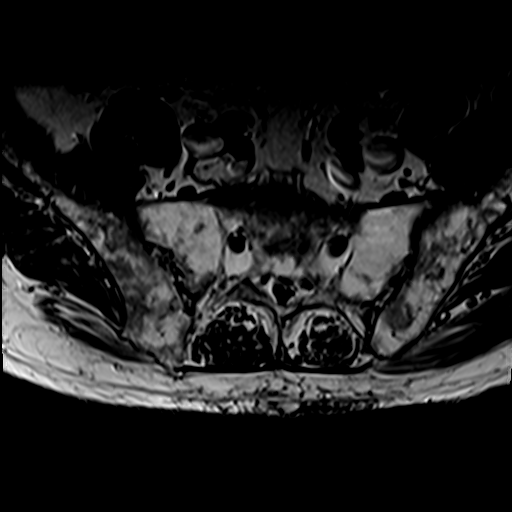
[im 6/37]
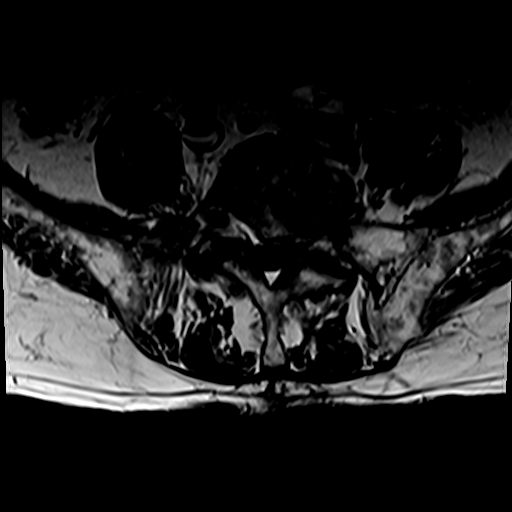
[im 12/37]
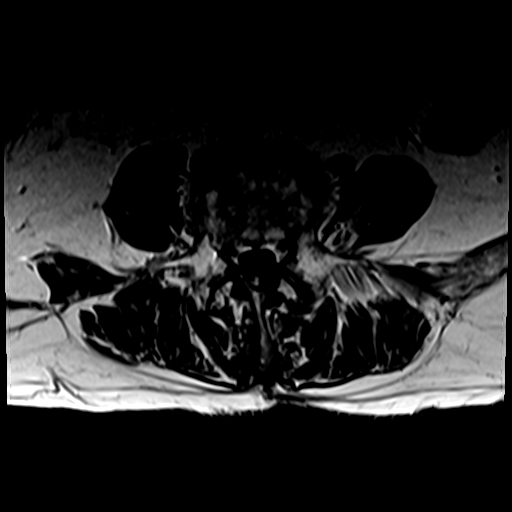
[im 17/37]
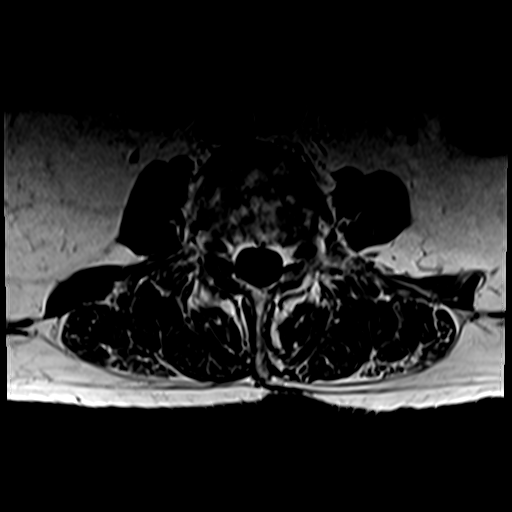
[im 20/37]
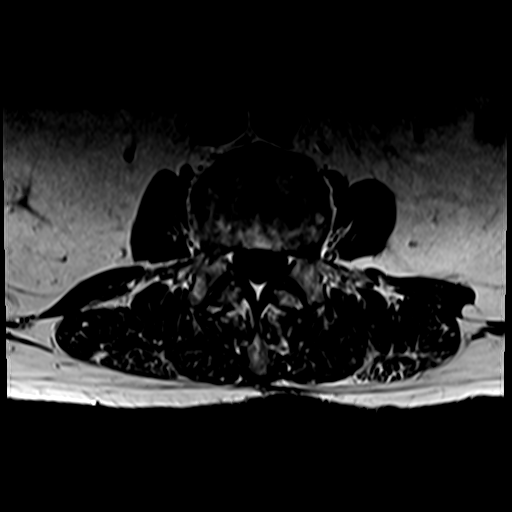
[im 25/37]
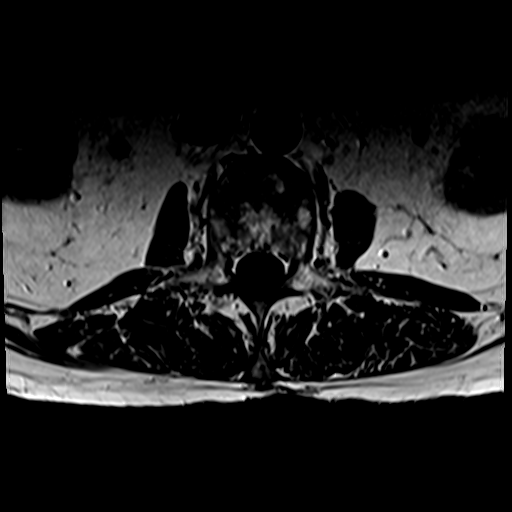
[im 31/37]
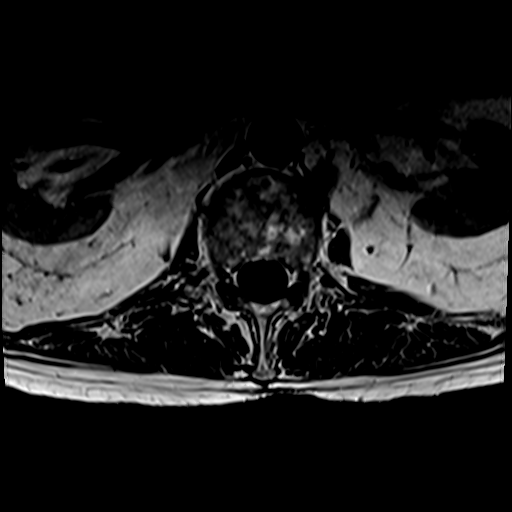
[im 37/37]
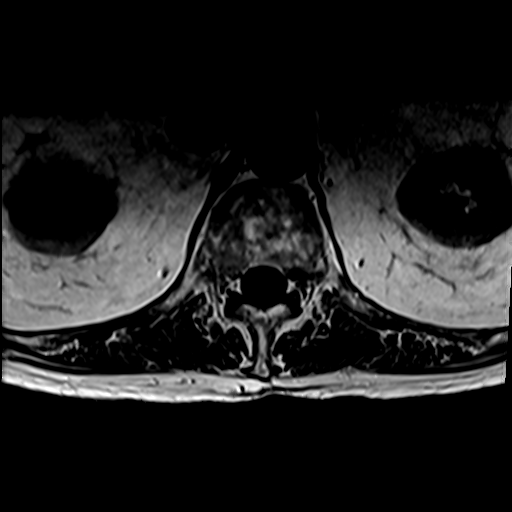

[31 of 48 positions shown; findings below may reference images not displayed]

FINDINGS: Segmentation:  Standard.

Alignment:  3 mm retrolisthesis at L4-5 and L5-S1.

Vertebrae: The patient has patchy red marrow reactivation throughout
the lumbar spine. This is likely due to the patient's chronic
anemia. No fractures or bone destruction or discitis.

Conus medullaris and cauda equina: Conus extends to the L2-3 level.
Conus appears normal.

Paraspinal and other soft tissues: No significant abnormalities.

Disc levels:

T12-L1: Disc desiccation. Otherwise normal.

L1-2: Disc desiccation. Otherwise normal.

L2-3: Disc desiccation. Minimal broad-based disc bulge with no
neural impingement. Otherwise normal.

L3-4: Prominent broad-based disc protrusion. Hypertrophy of the
ligamentum flavum combines with the disc protrusion to create severe
spinal stenosis with trapping of the nerve roots at this level, best
seen on images 24 of series 12 and series 13. No foraminal stenosis.

L4-5: Slight retrolisthesis. Disc space narrowing. Broad-based disc
protrusion with accompanying osteophytes. Combined with hypertrophy
of the ligamentum flavum and facet joints this creates severe spinal
stenosis with compression of both lateral recesses. No severe
foraminal stenosis.

L5-S1: Marked disc space narrowing. Slight retrolisthesis with a
broad-based bulge of the uncovered disc with accompanying
osteophytes. Combined with hypertrophy of the ligamentum flavum this
narrows both lateral recesses, right more than left. No significant
spinal or foraminal stenosis.
IMPRESSION: 1. Severe spinal stenosis at L3-4 with trapping of the nerve roots.
2. Severe spinal stenosis at L4-5.
3. Bilateral lateral recess impingement at L5-S1, right greater than
left.

## 2020-02-05 ENCOUNTER — Telehealth: Payer: Self-pay | Admitting: Urology

## 2020-02-05 NOTE — Telephone Encounter (Signed)
Patient called the office today requesting samples of Myrbetriq 50mg .  He is in the donut hole with his insurance and requesting samples.  1 month of samples are available at the front desk for patient to pick up.  He expressed understanding.

## 2020-03-05 ENCOUNTER — Telehealth: Payer: Self-pay | Admitting: *Deleted

## 2020-03-05 NOTE — Telephone Encounter (Signed)
Pt calling for more samples of Myrbetriq, per pt he gets samples every month, ok to give samples? Or send in rx? I offered the rx but pt states Carollee Herter told him to call every month.

## 2020-03-05 NOTE — Telephone Encounter (Signed)
He is in the donut hole right now, so we can give him some Myrbetriq samples.

## 2020-03-05 NOTE — Telephone Encounter (Signed)
Spoke with patient and advised results He will stop by and pick up samples

## 2020-04-01 NOTE — Telephone Encounter (Signed)
Pt calls requesting samples of Myrbetriq, advised pt that he has previously called and samples were already placed up front for him to come by and pick up. Pt states he does not remember calling but will come by tomorrow for samples.

## 2020-06-11 ENCOUNTER — Ambulatory Visit: Payer: Self-pay | Admitting: Urology

## 2020-06-25 ENCOUNTER — Other Ambulatory Visit: Payer: Self-pay | Admitting: Urology

## 2020-06-25 MED ORDER — MIRABEGRON ER 50 MG PO TB24: 50.0000 mg | ORAL_TABLET | Freq: Every day | ORAL | 0 refills | Status: DC

## 2020-07-09 DIAGNOSIS — K4090 Unilateral inguinal hernia, without obstruction or gangrene, not specified as recurrent: Secondary | ICD-10-CM | POA: Insufficient documentation

## 2020-07-09 DIAGNOSIS — R1013 Epigastric pain: Secondary | ICD-10-CM | POA: Insufficient documentation

## 2020-07-09 DIAGNOSIS — K3189 Other diseases of stomach and duodenum: Secondary | ICD-10-CM | POA: Insufficient documentation

## 2020-07-09 DIAGNOSIS — K649 Unspecified hemorrhoids: Secondary | ICD-10-CM | POA: Insufficient documentation

## 2020-07-09 DIAGNOSIS — E785 Hyperlipidemia, unspecified: Secondary | ICD-10-CM | POA: Insufficient documentation

## 2020-07-09 NOTE — Progress Notes (Signed)
04/20/2018 11:27 AM   [name] 1929/10/09 [id number]  Referring provider: Marisue Ivan, MD [id number] [facility] [address] [facility] [place],  [place] [address]  Chief Complaint  Patient presents with  . Benign Prostatic Hypertrophy   Urological history: 1. BPH with LU TS - I PSS 6/2 - PVR 0 mL - cystoscopy on 11/26/2016 which showed an enlarged prostate Only 2-3 cm in length. Minimally obstructive. - managed with finasteride 5 mg daily  2. Frequency - managed with Myrbetriq 50 mg daily  3. Nocturia - no sleep study  4. Family history of prostate cancer - paternal uncle with prostate cancer.    HPI: Richard Castaneda is a 87 y.o. male who presents today for yearly follow up with his daughter, Richard Castaneda.    He was told to drink 60 ounces of water daily.  He is having nocturia x 3-4.  Patient denies any modifying or aggravating factors.  Patient denies any gross hematuria, dysuria or suprapubic/flank pain.  Patient denies any fevers, chills, nausea or vomiting.    IPSS    Row Name 07/10/20 1000         International Prostate Symptom Score   How often have you had the sensation of not emptying your bladder? Less than 1 in 5     How often have you had to urinate less than every two hours? Not at All     How often have you found you stopped and started again several times when you urinated? Less than 1 in 5 times     How often have you found it difficult to postpone urination? Not at All     How often have you had a weak urinary stream? Less than 1 in 5 times     How often have you had to strain to start urination? Not at All     How many times did you typically get up at night to urinate? 3 Times     Total IPSS Score 6           Quality of Life due to urinary symptoms   If you were to spend the rest of your life with your urinary condition just the way it is now how would you feel about that? Mostly Satisfied            Score:  1-7 Mild 8-19  Moderate 20-35 Severe   PMH: Past Medical History:  Diagnosis Date  . Acquired hypothyroidism 11/16/2016  . Anterior myocardial infarction ([facility]) 12/29/2013   Overview:  2004  . Benign essential HTN 12/29/2013  . Benign prostatic hyperplasia with urinary frequency 10/19/2016  . Borderline diabetes mellitus 11/16/2016  . Coronary artery disease 12/29/2013   Overview:  Anterior MI, PCI and stent placement of LAD11/04  . H/O iron deficiency anemia 09/09/2015  . History of BPH   . Inguinal hernia, bilateral   . Moderate tricuspid insufficiency 09/12/2014  . Occasional tremors    Left hand only with writing and eating  . Pre-diabetes   . Primary osteoarthritis of left knee 08/26/2016  . Pure hypercholesterolemia 12/29/2013  . PVD (peripheral vascular disease) ([facility]) 12/29/2013   Overview:  With carotid atherosclerosis     Surgical History: Past Surgical History:  Procedure Laterality Date  . CATARACT EXTRACTION W/ INTRAOCULAR LENS  IMPLANT, BILATERAL    . CORONARY ANGIOPLASTY  2004   1 stent  . EYE SURGERY Bilateral    Cataract Extraction with IOL  . HERNIA REPAIR Bilateral  Inguinal Hernia Repair  . PARTIAL KNEE ARTHROPLASTY Left 12/31/2016   Procedure: UNICOMPARTMENTAL KNEE;  Surgeon: Christena Flake, MD;  Location: [facility] ORS;  Service: Orthopedics;  Laterality: Left;  . TOTAL KNEE REVISION Left 06/22/2017   Procedure: TOTAL KNEE REVISION, CONVERTING A PARTIAL KNEE TO A TOTAL;  Surgeon: Christena Flake, MD;  Location: [facility] ORS;  Service: Orthopedics;  Laterality: Left;    Home Medications:  Allergies as of 07/10/2020      Reactions   Terazosin    Other reaction(s): Orthostatic hypotension      Medication List       Accurate as of July 10, 2020 11:27 AM. If you have any questions, ask your nurse or doctor.        acetaminophen 650 MG CR tablet Commonly known as: TYLENOL Take 650 mg by mouth 3 (three) times daily.   amLODipine 5 MG tablet Commonly known as: NORVASC Take 5 mg by  mouth daily before breakfast.   amoxicillin 500 MG capsule Commonly known as: AMOXIL TAKE 4 CAPSULES ONE HOUR PRIOR TO DENTAL APPOINTMENT.   aspirin EC 81 MG tablet Take 81 mg by mouth daily.   ferrous sulfate 325 (65 FE) MG tablet Take 325 mg by mouth daily with breakfast.   finasteride 5 MG tablet Commonly known as: PROSCAR TAKE 1 TABLET (5 MG TOTAL) BY MOUTH ONCE DAILY.   Fish Oil 1200 MG Caps Take 1,200 mg by mouth daily with breakfast.   fluticasone 50 MCG/ACT nasal spray Commonly known as: FLONASE Place 1-2 sprays into both nostrils daily as needed. For allergies.   levothyroxine 150 MCG tablet Commonly known as: SYNTHROID Take 150 mcg by mouth daily at 6 (six) AM.   lisinopril 40 MG tablet Commonly known as: ZESTRIL Take 40 mg by mouth daily before breakfast.   mirabegron ER 50 MG Tb24 tablet Commonly known as: MYRBETRIQ Take 1 tablet (50 mg total) by mouth daily.   Osteo Bi-Flex Adv Triple St Tabs Take 1 tablet by mouth 2 (two) times daily.   simvastatin 20 MG tablet Commonly known as: ZOCOR Take 20 mg by mouth daily at 8 pm. (2000)   tetrahydrozoline 0.05 % ophthalmic solution Place 1 drop into both eyes daily. Visine Clear   Vitamin D3 10 MCG (400 UNIT) Caps Take 400 Units by mouth daily with supper.       Allergies:  Allergies  Allergen Reactions  . Terazosin     Other reaction(s): Orthostatic hypotension    Family History: Family History  Problem Relation Age of Onset  . Kidney failure Mother   . Prostate cancer Paternal Uncle   . Bladder Cancer Neg Hx   . Kidney cancer Neg Hx     Social History:  reports that he has never smoked. He has never used smokeless tobacco. He reports that he does not drink alcohol and does not use drugs.  For pertinent review of systems please refer to history of present illness  Physical Exam: BP 122/64   Pulse 69   Ht 5\' 5"  (1.651 m)   Wt 154 lb (69.9 kg)   BMI 25.63 kg/m   Constitutional:  Well  nourished. Alert and oriented, No acute distress. HEENT: [place] AT, mask in place.  Trachea midline Cardiovascular: No clubbing, cyanosis, or edema. Respiratory: Normal respiratory effort, no increased work of breathing. Neurologic: Grossly intact, no focal deficits, moving all 4 extremities. Psychiatric: Normal mood and affect.  Laboratory Data: Specimen:  Blood  Ref Range &  Units 3 wk ago  WBC (White Blood Cell Count) 4.1 - 10.2 10^3/uL 6.9   RBC (Red Blood Cell Count) 4.69 - 6.13 10^6/uL 3.78Low   Hemoglobin 14.1 - 18.1 gm/dL 93.2IZT   Hematocrit 24.5 - 52.0 % 37.5Low   MCV (Mean Corpuscular Volume) 80.0 - 100.0 fl 99.2   MCH (Mean Corpuscular Hemoglobin) 27.0 - 31.2 pg 32.3High   MCHC (Mean Corpuscular Hemoglobin Concentration) 32.0 - 36.0 gm/dL 80.9   Platelet Count 983 - 450 10^3/uL 264   RDW-CV (Red Cell Distribution Width) 11.6 - 14.8 % 12.1   MPV (Mean Platelet Volume) 9.4 - 12.4 fl 10.0   Neutrophils 1.50 - 7.80 10^3/uL 4.78   Lymphocytes 1.00 - 3.60 10^3/uL 1.26   Monocytes 0.00 - 1.50 10^3/uL 0.70   Eosinophils 0.00 - 0.55 10^3/uL 0.12   Basophils 0.00 - 0.09 10^3/uL 0.04   Neutrophil % 32.0 - 70.0 % 69.1   Lymphocyte % 10.0 - 50.0 % 18.2   Monocyte % 4.0 - 13.0 % 10.1   Eosinophil % 1.0 - 5.0 % 1.7   Basophil% 0.0 - 2.0 % 0.6   Immature Granulocyte % <=0.7 % 0.3   Immature Granulocyte Count <=0.06 10^3/L 0.02   Resulting Agency  [facility]  Specimen Collected: 06/17/20 11:44 AM Last Resulted: 06/17/20 1:27 PM  Received From: [facility]  Result Received: 06/25/20 10:23 AM   Specimen:  Blood  Ref Range & Units 2 mo ago  Hemoglobin A1C 4.2 - 5.6 % 6.3High   Average Blood Glucose (Calc) mg/dL 382   Resulting Agency  [facility]   Narrative Performed by [facility] Normal Range:  4.2 - 5.6%  Increased Risk: 5.7 - 6.4%  Diabetes:    >= 6.5%  Glycemic Control for adults with diabetes:  <7%  Specimen Collected: 04/11/20 7:32 AM Last Resulted: 04/11/20 11:02 AM  Received From: [facility]  Result Received: 06/25/20 10:23 AM  I have reviewed the labs.   Pertinent Imaging: Results for ASHFORD, CLOUSE (MRN [id number]) as of 07/10/2020 11:10  Ref. Range 07/10/2020 10:45  Scan Result Unknown 9mL     Assessment & Plan:    1. BPH with LUTS - IPSS score is stable  - Continue finasteride 5 mg  2. Frequency -Continue Myrbetriq 50 mg daily, but every other day   3.Nocturia -does not want to undergo a sleep study -He will try taking his nighttime medications at suppertime so that he can completely go without water in the evenings to see if this will improve his nocturia  Return in about 1 year (around 07/10/2021) for I PSS and PVR .  Michiel Cowboy, PA-C  [facility] [address] [place], [place] [address] [phone]

## 2020-07-10 ENCOUNTER — Other Ambulatory Visit: Admission: RE | Admit: 2020-07-10 | Payer: Medicare Other | Source: Ambulatory Visit

## 2020-07-10 ENCOUNTER — Encounter: Payer: Self-pay | Admitting: Urology

## 2020-07-10 ENCOUNTER — Other Ambulatory Visit: Payer: Self-pay

## 2020-07-10 ENCOUNTER — Ambulatory Visit: Payer: Medicare Other | Admitting: Urology

## 2020-07-10 VITALS — BP 122/64 | HR 69 | Ht 65.0 in | Wt 154.0 lb

## 2020-07-10 DIAGNOSIS — N138 Other obstructive and reflux uropathy: Secondary | ICD-10-CM | POA: Diagnosis not present

## 2020-07-10 DIAGNOSIS — R350 Frequency of micturition: Secondary | ICD-10-CM

## 2020-07-10 DIAGNOSIS — R351 Nocturia: Secondary | ICD-10-CM | POA: Diagnosis not present

## 2020-07-10 DIAGNOSIS — N401 Enlarged prostate with lower urinary tract symptoms: Secondary | ICD-10-CM | POA: Diagnosis not present

## 2020-07-10 LAB — BLADDER SCAN AMB NON-IMAGING

## 2020-07-21 ENCOUNTER — Other Ambulatory Visit: Payer: Self-pay | Admitting: Urology

## 2020-08-12 ENCOUNTER — Ambulatory Visit: Admission: RE | Admit: 2020-08-12 | Payer: Medicare Other | Source: Home / Self Care | Admitting: Internal Medicine

## 2020-08-12 ENCOUNTER — Encounter: Admission: RE | Payer: Self-pay | Source: Home / Self Care

## 2020-08-12 SURGERY — COLONOSCOPY WITH PROPOFOL
Anesthesia: General

## 2021-03-26 ENCOUNTER — Telehealth: Payer: Self-pay | Admitting: *Deleted

## 2021-03-26 NOTE — Telephone Encounter (Signed)
Patient is in donut hole give him a month for myrbetriq samples

## 2021-07-09 NOTE — Progress Notes (Unsigned)
04/20/2018 8:33 PM   Richard Castaneda [birth year] [id number]  Referring provider: Dion Body, MD [facility] [place],  [place] [address]  No chief complaint on file.  Urological history: 1. BPH with LU TS - cystoscopy on 11/26/2016 which showed an enlarged prostate Only 2-3 cm in length. Minimally obstructive -I PSS *** - managed with finasteride 5 mg daily  2. Frequency - managed with Myrbetriq 50 mg daily  3. Nocturia - no sleep study  4. Family history of prostate cancer - paternal uncle with prostate cancer.    HPI: Richard Castaneda is a 88 y.o. male who presents today for yearly follow up with his daughter, Richard Castaneda.       Score:  1-7 Mild 8-19 Moderate 20-35 Severe   PMH: Past Medical History:  Diagnosis Date   Acquired hypothyroidism 11/16/2016   Anterior myocardial infarction ([place]) 12/29/2013   Overview:  2004   Benign essential HTN 12/29/2013   Benign prostatic hyperplasia with urinary frequency 10/19/2016   Borderline diabetes mellitus 11/16/2016   Coronary artery disease 12/29/2013   Overview:  Anterior MI, PCI and stent placement of LAD11/04   H/O iron deficiency anemia 09/09/2015   History of BPH    Inguinal hernia, bilateral    Moderate tricuspid insufficiency 09/12/2014   Occasional tremors    Left hand only with writing and eating   Pre-diabetes    Primary osteoarthritis of left knee 08/26/2016   Pure hypercholesterolemia 12/29/2013   PVD (peripheral vascular disease) ([place]) 12/29/2013   Overview:  With carotid atherosclerosis     Surgical History: Past Surgical History:  Procedure Laterality Date   CATARACT EXTRACTION W/ INTRAOCULAR LENS  IMPLANT, BILATERAL     CORONARY ANGIOPLASTY  2004   1 stent   EYE SURGERY Bilateral    Cataract Extraction with IOL   HERNIA REPAIR Bilateral    Inguinal Hernia Repair   PARTIAL KNEE ARTHROPLASTY Left 12/31/2016   Procedure: UNICOMPARTMENTAL KNEE;  Surgeon: Richard Mull,  MD;  Location: [facility] ORS;  Service: Orthopedics;  Laterality: Left;   TOTAL KNEE REVISION Left 06/22/2017   Procedure: TOTAL KNEE REVISION, CONVERTING A PARTIAL KNEE TO A TOTAL;  Surgeon: Richard Mull, MD;  Location: [facility] ORS;  Service: Orthopedics;  Laterality: Left;    Home Medications:  Allergies as of 07/10/2021       Reactions   Terazosin    Other reaction(s): Orthostatic hypotension        Medication List        Accurate as of July 09, 2021  8:33 PM. If you have any questions, ask your nurse or doctor.          acetaminophen 650 MG CR tablet Commonly known as: TYLENOL Take 650 mg by mouth 3 (three) times daily.   amLODipine 5 MG tablet Commonly known as: NORVASC Take 5 mg by mouth daily before breakfast.   amoxicillin 500 MG capsule Commonly known as: AMOXIL TAKE 4 CAPSULES ONE HOUR PRIOR TO DENTAL APPOINTMENT.   aspirin EC 81 MG tablet Take 81 mg by mouth daily.   ferrous sulfate 325 (65 FE) MG tablet Take 325 mg by mouth daily with breakfast.   finasteride 5 MG tablet Commonly known as: PROSCAR TAKE 1 TABLET (5 MG TOTAL) BY MOUTH ONCE DAILY.   Fish Oil 1200 MG Caps Take 1,200 mg by mouth daily with breakfast.   fluticasone 50 MCG/ACT nasal spray Commonly known as: FLONASE Place 1-2 sprays into both  nostrils daily as needed. For allergies.   levothyroxine 150 MCG tablet Commonly known as: SYNTHROID Take 150 mcg by mouth daily at 6 (six) AM.   lisinopril 40 MG tablet Commonly known as: ZESTRIL Take 40 mg by mouth daily before breakfast.   Myrbetriq 50 MG Tb24 tablet Generic drug: mirabegron ER TAKE 1 TABLET BY MOUTH EVERY DAY   Osteo Bi-Flex Adv Triple St Tabs Take 1 tablet by mouth 2 (two) times daily.   simvastatin 20 MG tablet Commonly known as: ZOCOR Take 20 mg by mouth daily at 8 pm. (2000)   tetrahydrozoline 0.05 % ophthalmic solution Place 1 drop into both eyes daily. Visine Clear   Vitamin D3 10 MCG (400 UNIT) Caps Take 400 Units  by mouth daily with supper.        Allergies:  Allergies  Allergen Reactions   Terazosin     Other reaction(s): Orthostatic hypotension    Family History: Family History  Problem Relation Age of Onset   Kidney failure Mother    Prostate cancer Paternal Uncle    Bladder Cancer Neg Hx    Kidney cancer Neg Hx     Social History:  reports that he has never smoked. He has never used smokeless tobacco. He reports that he does not drink alcohol and does not use drugs.  For pertinent review of systems please refer to history of present illness  Physical Exam: There were no vitals taken for this visit.  Constitutional:  Well nourished. Alert and oriented, No acute distress. HEENT: [place] AT, moist mucus membranes.  Trachea midline Cardiovascular: No clubbing, cyanosis, or edema. Respiratory: Normal respiratory effort, no increased work of breathing. GU: No CVA tenderness.  No bladder fullness or masses.  Patient with circumcised/uncircumcised phallus. ***Foreskin easily retracted***  Urethral meatus is patent.  No penile discharge. No penile lesions or rashes. Scrotum without lesions, cysts, rashes and/or edema.  Testicles are located scrotally bilaterally. No masses are appreciated in the testicles. Left and right epididymis are normal. Rectal: Patient with  normal sphincter tone. Anus and perineum without scarring or rashes. No rectal masses are appreciated. Prostate is approximately *** grams, *** nodules are appreciated. Seminal vesicles are normal. Neurologic: Grossly intact, no focal deficits, moving all 4 extremities. Psychiatric: Normal mood and affect.   Laboratory Data: Thyroid Stimulating Hormone (TSH) 0.450-5.330 uIU/ml uIU/mL 0.229 Low    Resulting Agency  [facility] - LAB  Specimen Collected: 06/18/21 11:15 Last Resulted: 06/18/21 15:40  Received From: [facility]  Result Received: 07/09/21 20:33   Glucose 70 - 110 mg/dL 93   Sodium 136 - 145  mmol/L 140   Potassium 3.6 - 5.1 mmol/L 4.9   Chloride 97 - 109 mmol/L 107   Carbon Dioxide (CO2) 22.0 - 32.0 mmol/L 29.3   Urea Nitrogen (BUN) 7 - 25 mg/dL 37 High    Creatinine 0.7 - 1.3 mg/dL 1.4 High    Glomerular Filtration Rate (eGFR), MDRD Estimate >60 mL/min/1.73sq m 48 Low    Calcium 8.7 - 10.3 mg/dL 9.1   AST  8 - 39 U/L 18   ALT  6 - 57 U/L 24   Alk Phos (alkaline Phosphatase) 34 - 104 U/L 74   Albumin 3.5 - 4.8 g/dL 4.0   Bilirubin, Total 0.3 - 1.2 mg/dL 0.5   Protein, Total 6.1 - 7.9 g/dL 6.0 Low    A/G Ratio 1.0 - 5.0 gm/dL 2.0   Resulting [facility] - LAB  Specimen Collected: 04/11/21 08:20 Last Resulted: 04/11/21 11:05  Received From: [facility]  Result Received: 07/09/21 20:33   WBC (White Blood Cell Count) 4.1 - 10.2 103/uL 7.7   RBC (Red Blood Cell Count) 4.69 - 6.13 106/uL 3.71 Low    Hemoglobin 14.1 - 18.1 gm/dL 11.8 Low    Hematocrit 40.0 - 52.0 % 35.9 Low    MCV (Mean Corpuscular Volume) 80.0 - 100.0 fl 96.8   MCH (Mean Corpuscular Hemoglobin) 27.0 - 31.2 pg 31.8 High    MCHC (Mean Corpuscular Hemoglobin Concentration) 32.0 - 36.0 gm/dL 32.9   Platelet Count 150 - 450 103/uL 259   RDW-CV (Red Cell Distribution Width) 11.6 - 14.8 % 12.0   MPV (Mean Platelet Volume) 9.4 - 12.4 fl 9.9   Neutrophils 1.50 - 7.80 103/uL 5.14   Lymphocytes 1.00 - 3.60 103/uL 1.53   Monocytes 0.00 - 1.50 103/uL 0.78   Eosinophils 0.00 - 0.55 103/uL 0.17   Basophils 0.00 - 0.09 103/uL 0.05   Neutrophil % 32.0 - 70.0 % 66.8   Lymphocyte % 10.0 - 50.0 % 19.9   Monocyte % 4.0 - 13.0 % 10.1   Eosinophil % 1.0 - 5.0 % 2.2   Basophil% 0.0 - 2.0 % 0.7   Immature Granulocyte % <=0.7 % 0.3   Immature Granulocyte Count <=0.06 10^3/L 0.02   Resulting Agency  [facility] - LAB  Specimen Collected: 04/11/21 08:20 Last Resulted: 04/11/21 09:28  Received From: [place] [facility]  Result Received: 07/09/21 20:33    Cholesterol, Total 100 - 200 mg/dL 123   Triglyceride 35 - 199 mg/dL 84   HDL (High Density Lipoprotein) Cholesterol 29.0 - 71.0 mg/dL 46.1   LDL Calculated 0 - 130 mg/dL 60   VLDL Cholesterol mg/dL 17   Cholesterol/HDL Ratio  2.7   Resulting Agency  [facility] - LAB  Specimen Collected: 04/11/21 08:20 Last Resulted: 04/11/21 11:05  Received From: [facility]  Result Received: 07/09/21 20:33   Hemoglobin A1C 4.2 - 5.6 % 5.9 High    Average Blood Glucose (Calc) mg/dL [facility] - LAB  Narrative Performed by [facility] - LAB Normal Range:    4.2 - 5.6%  Increased Risk:  5.7 - 6.4%  Diabetes:        >= 6.5%  Glycemic Control for adults with diabetes:  <7%   Specimen Collected: 04/11/21 08:20 Last Resulted: 04/11/21 09:50  Received From: [facility]  Result Received: 07/09/21 20:33  I have reviewed the labs.   Pertinent Imaging: N/A    Assessment & Plan:    1. BPH with LUTS - Continue finasteride 5 mg  2. Frequency -Continue Myrbetriq 50 mg daily, but every other day   3.Nocturia -does not want to undergo a sleep study -He will try taking his nighttime medications at suppertime so that he can completely go without water in the evenings to see if this will improve his nocturia  No follow-ups on file.  Zara Council, PA-C  [facility] [address] [place], [place] [address] [phone]

## 2021-07-10 ENCOUNTER — Ambulatory Visit: Payer: Medicare Other | Admitting: Urology

## 2021-07-10 ENCOUNTER — Encounter: Payer: Self-pay | Admitting: Urology

## 2021-07-10 ENCOUNTER — Other Ambulatory Visit: Payer: Self-pay

## 2021-07-10 VITALS — BP 117/61 | HR 62 | Ht 66.0 in | Wt 146.0 lb

## 2021-07-10 DIAGNOSIS — R351 Nocturia: Secondary | ICD-10-CM

## 2021-07-10 DIAGNOSIS — N401 Enlarged prostate with lower urinary tract symptoms: Secondary | ICD-10-CM | POA: Diagnosis not present

## 2021-07-10 DIAGNOSIS — R350 Frequency of micturition: Secondary | ICD-10-CM | POA: Diagnosis not present

## 2021-07-10 DIAGNOSIS — N138 Other obstructive and reflux uropathy: Secondary | ICD-10-CM | POA: Diagnosis not present

## 2021-07-10 MED ORDER — FINASTERIDE 5 MG PO TABS
ORAL_TABLET | ORAL | 3 refills | Status: DC
Start: 2021-07-10 — End: 2023-09-11

## 2021-07-10 MED ORDER — MIRABEGRON ER 50 MG PO TB24: 50.0000 mg | ORAL_TABLET | Freq: Every day | ORAL | 3 refills | Status: DC

## 2021-07-10 NOTE — Progress Notes (Incomplete)
04/20/2018 10:55 AM   Richard Castaneda 1929/05/15 [id number]  Referring provider: Dion Body, MD [facility] [place],  [place] [address]  Chief Complaint  Patient presents with   Benign Prostatic Hypertrophy   Urological history: 1. BPH with LU TS - cystoscopy on 11/26/2016 which showed an enlarged prostate Only 2-3 cm in length. Minimally obstructive -I PSS 10/2 - managed with finasteride 5 mg daily  2. Frequency - managed with Myrbetriq 50 mg daily  3. Nocturia - no sleep study  4. Family history of prostate cancer - paternal uncle with prostate cancer.    HPI: Richard Castaneda is a 88 y.o. male who presents today for yearly follow up with his son, [name].     IPSS     Row Name 07/10/21 1000         International Prostate Symptom Score   How often have you had the sensation of not emptying your bladder? Less than 1 in 5     How often have you had to urinate less than every two hours? Not at All     How often have you found you stopped and started again several times when you urinated? Less than half the time     How often have you found it difficult to postpone urination? Not at All     How often have you had a weak urinary stream? Less than half the time     How often have you had to strain to start urination? Less than half the time     How many times did you typically get up at night to urinate? 3 Times     Total IPSS Score 10       Quality of Life due to urinary symptoms   If you were to spend the rest of your life with your urinary condition just the way it is now how would you feel about that? Mostly Satisfied               Score:  1-7 Mild 8-19 Moderate 20-35 Severe   PMH: Past Medical History:  Diagnosis Date   Acquired hypothyroidism 11/16/2016   Anterior myocardial infarction ([place]) 12/29/2013   Overview:  2004   Benign essential HTN 12/29/2013   Benign prostatic hyperplasia with urinary frequency  10/19/2016   Borderline diabetes mellitus 11/16/2016   Coronary artery disease 12/29/2013   Overview:  Anterior MI, PCI and stent placement of LAD11/04   H/O iron deficiency anemia 09/09/2015   History of BPH    Inguinal hernia, bilateral    Moderate tricuspid insufficiency 09/12/2014   Occasional tremors    Left hand only with writing and eating   Pre-diabetes    Primary osteoarthritis of left knee 08/26/2016   Pure hypercholesterolemia 12/29/2013   PVD (peripheral vascular disease) ([place]) 12/29/2013   Overview:  With carotid atherosclerosis     Surgical History: Past Surgical History:  Procedure Laterality Date   CATARACT EXTRACTION W/ INTRAOCULAR LENS  IMPLANT, BILATERAL     CORONARY ANGIOPLASTY  2004   1 stent   EYE SURGERY Bilateral    Cataract Extraction with IOL   HERNIA REPAIR Bilateral    Inguinal Hernia Repair   PARTIAL KNEE ARTHROPLASTY Left 12/31/2016   Procedure: UNICOMPARTMENTAL KNEE;  Surgeon: Corky Mull, MD;  Location: [facility] ORS;  Service: Orthopedics;  Laterality: Left;   TOTAL KNEE REVISION Left 06/22/2017   Procedure: TOTAL KNEE REVISION, CONVERTING A PARTIAL KNEE TO  A TOTAL;  Surgeon: Corky Mull, MD;  Location: [facility] ORS;  Service: Orthopedics;  Laterality: Left;    Home Medications:  Allergies as of 07/10/2021       Reactions   Terazosin    Other reaction(s): Orthostatic hypotension        Medication List        Accurate as of July 10, 2021 10:55 AM. If you have any questions, ask your nurse or doctor.          acetaminophen 650 MG CR tablet Commonly known as: TYLENOL Take 650 mg by mouth 3 (three) times daily.   amLODipine 5 MG tablet Commonly known as: NORVASC Take 5 mg by mouth daily before breakfast.   amoxicillin 500 MG capsule Commonly known as: AMOXIL TAKE 4 CAPSULES ONE HOUR PRIOR TO DENTAL APPOINTMENT.   aspirin EC 81 MG tablet Take 81 mg by mouth daily.   ferrous sulfate 325 (65 FE) MG tablet Take 325 mg by  mouth daily with breakfast.   finasteride 5 MG tablet Commonly known as: PROSCAR TAKE 1 TABLET (5 MG TOTAL) BY MOUTH ONCE DAILY.   Fish Oil 1200 MG Caps Take 1,200 mg by mouth daily with breakfast.   fluticasone 50 MCG/ACT nasal spray Commonly known as: FLONASE Place 1-2 sprays into both nostrils daily as needed. For allergies.   levothyroxine 150 MCG tablet Commonly known as: SYNTHROID Take 150 mcg by mouth daily at 6 (six) AM.   lisinopril 40 MG tablet Commonly known as: ZESTRIL Take 40 mg by mouth daily before breakfast.   Myrbetriq 50 MG Tb24 tablet Generic drug: mirabegron ER TAKE 1 TABLET BY MOUTH EVERY DAY   Osteo Bi-Flex Adv Triple St Tabs Take 1 tablet by mouth 2 (two) times daily.   simvastatin 20 MG tablet Commonly known as: ZOCOR Take 20 mg by mouth daily at 8 pm. (2000)   tetrahydrozoline 0.05 % ophthalmic solution Place 1 drop into both eyes daily. Visine Clear   Vitamin D3 10 MCG (400 UNIT) Caps Take 400 Units by mouth daily with supper.        Allergies:  Allergies  Allergen Reactions   Terazosin     Other reaction(s): Orthostatic hypotension    Family History: Family History  Problem Relation Age of Onset   Kidney failure Mother    Prostate cancer Paternal Uncle    Bladder Cancer Neg Hx    Kidney cancer Neg Hx     Social History:  reports that he has never smoked. He has never used smokeless tobacco. He reports that he does not drink alcohol and does not use drugs.  For pertinent review of systems please refer to history of present illness  Physical Exam: BP 117/61    Pulse 62    Ht '5\' 6"'  (1.676 m)    Wt 146 lb (66.2 kg)    BMI 23.57 kg/m   Constitutional:  Well nourished. Alert and oriented, No acute distress. HEENT: [place] AT, moist mucus membranes.  Trachea midline Cardiovascular: No clubbing, cyanosis, or edema. Respiratory: Normal respiratory effort, no increased work of breathing. GU: No CVA tenderness.  No bladder fullness  or masses.  Patient with circumcised/uncircumcised phallus. ***Foreskin easily retracted***  Urethral meatus is patent.  No penile discharge. No penile lesions or rashes. Scrotum without lesions, cysts, rashes and/or edema.  Testicles are located scrotally bilaterally. No masses are appreciated in the testicles. Left and right epididymis are normal. Rectal: Patient with  normal sphincter tone. Anus  and perineum without scarring or rashes. No rectal masses are appreciated. Prostate is approximately *** grams, *** nodules are appreciated. Seminal vesicles are normal. Neurologic: Grossly intact, no focal deficits, moving all 4 extremities. Psychiatric: Normal mood and affect.   Laboratory Data: Thyroid Stimulating Hormone (TSH) 0.450-5.330 uIU/ml uIU/mL 0.229 Low    Resulting Agency  [facility] - LAB  Specimen Collected: 06/18/21 11:15 Last Resulted: 06/18/21 15:40  Received From: [facility]  Result Received: 07/09/21 20:33   Glucose 70 - 110 mg/dL 93   Sodium 136 - 145 mmol/L 140   Potassium 3.6 - 5.1 mmol/L 4.9   Chloride 97 - 109 mmol/L 107   Carbon Dioxide (CO2) 22.0 - 32.0 mmol/L 29.3   Urea Nitrogen (BUN) 7 - 25 mg/dL 37 High    Creatinine 0.7 - 1.3 mg/dL 1.4 High    Glomerular Filtration Rate (eGFR), MDRD Estimate >60 mL/min/1.73sq m 48 Low    Calcium 8.7 - 10.3 mg/dL 9.1   AST  8 - 39 U/L 18   ALT  6 - 57 U/L 24   Alk Phos (alkaline Phosphatase) 34 - 104 U/L 74   Albumin 3.5 - 4.8 g/dL 4.0   Bilirubin, Total 0.3 - 1.2 mg/dL 0.5   Protein, Total 6.1 - 7.9 g/dL 6.0 Low    A/G Ratio 1.0 - 5.0 gm/dL 2.0   Resulting Agency  [facility] - LAB  Specimen Collected: 04/11/21 08:20 Last Resulted: 04/11/21 11:05  Received From: [facility]  Result Received: 07/09/21 20:33   WBC (White Blood Cell Count) 4.1 - 10.2 103/uL 7.7   RBC (Red Blood Cell Count) 4.69 - 6.13 106/uL 3.71 Low    Hemoglobin 14.1 - 18.1 gm/dL 11.8 Low     Hematocrit 40.0 - 52.0 % 35.9 Low    MCV (Mean Corpuscular Volume) 80.0 - 100.0 fl 96.8   MCH (Mean Corpuscular Hemoglobin) 27.0 - 31.2 pg 31.8 High    MCHC (Mean Corpuscular Hemoglobin Concentration) 32.0 - 36.0 gm/dL 32.9   Platelet Count 150 - 450 103/uL 259   RDW-CV (Red Cell Distribution Width) 11.6 - 14.8 % 12.0   MPV (Mean Platelet Volume) 9.4 - 12.4 fl 9.9   Neutrophils 1.50 - 7.80 103/uL 5.14   Lymphocytes 1.00 - 3.60 103/uL 1.53   Monocytes 0.00 - 1.50 103/uL 0.78   Eosinophils 0.00 - 0.55 103/uL 0.17   Basophils 0.00 - 0.09 103/uL 0.05   Neutrophil % 32.0 - 70.0 % 66.8   Lymphocyte % 10.0 - 50.0 % 19.9   Monocyte % 4.0 - 13.0 % 10.1   Eosinophil % 1.0 - 5.0 % 2.2   Basophil% 0.0 - 2.0 % 0.7   Immature Granulocyte % <=0.7 % 0.3   Immature Granulocyte Count <=0.06 10^3/L 0.02   Resulting Agency  [facility] - LAB  Specimen Collected: 04/11/21 08:20 Last Resulted: 04/11/21 09:28  Received From: [facility]  Result Received: 07/09/21 20:33   Cholesterol, Total 100 - 200 mg/dL 123   Triglyceride 35 - 199 mg/dL 84   HDL (High Density Lipoprotein) Cholesterol 29.0 - 71.0 mg/dL 46.1   LDL Calculated 0 - 130 mg/dL 60   VLDL Cholesterol mg/dL 17   Cholesterol/HDL Ratio  2.7   Resulting Agency  [facility] - LAB  Specimen Collected: 04/11/21 08:20 Last Resulted: 04/11/21 11:05  Received From: [facility]  Result Received: 07/09/21 20:33   Hemoglobin A1C 4.2 -  5.6 % 5.9 High    Average Blood Glucose (Calc) mg/dL [place]  Narrative Performed by [facility] - LAB Normal Range:    4.2 - 5.6%  Increased Risk:  5.7 - 6.4%  Diabetes:        >= 6.5%  Glycemic Control for adults with diabetes:  <7%   Specimen Collected: 04/11/21 08:20 Last Resulted: 04/11/21 09:50  Received From: [facility]  Result Received: 07/09/21 20:33  I have reviewed the  labs.   Pertinent Imaging: N/A    Assessment & Plan:    1. BPH with LUTS - Continue finasteride 5 mg  2. Frequency -Continue Myrbetriq 50 mg daily, but every other day   3.Nocturia -does not want to undergo a sleep study -He will try taking his nighttime medications at suppertime so that he can completely go without water in the evenings to see if this will improve his nocturia  No follow-ups on file.  Zara Council, PA-C  [facility] [address] [place],  [address] [phone]

## 2021-08-14 ENCOUNTER — Emergency Department: Payer: Medicare Other

## 2021-08-14 ENCOUNTER — Inpatient Hospital Stay
Admission: EM | Admit: 2021-08-14 | Discharge: 2021-08-16 | DRG: 871 | Disposition: A | Discharge status: Home Health | Payer: Medicare Other | Attending: Hospitalist | Admitting: Hospitalist

## 2021-08-14 ENCOUNTER — Encounter: Payer: Self-pay | Admitting: Emergency Medicine

## 2021-08-14 ENCOUNTER — Other Ambulatory Visit: Payer: Self-pay

## 2021-08-14 ENCOUNTER — Inpatient Hospital Stay: Payer: Medicare Other

## 2021-08-14 DIAGNOSIS — K625 Hemorrhage of anus and rectum: Secondary | ICD-10-CM | POA: Diagnosis present

## 2021-08-14 DIAGNOSIS — N179 Acute kidney failure, unspecified: Secondary | ICD-10-CM | POA: Diagnosis present

## 2021-08-14 DIAGNOSIS — E039 Hypothyroidism, unspecified: Secondary | ICD-10-CM | POA: Diagnosis present

## 2021-08-14 DIAGNOSIS — I252 Old myocardial infarction: Secondary | ICD-10-CM | POA: Diagnosis not present

## 2021-08-14 DIAGNOSIS — D649 Anemia, unspecified: Secondary | ICD-10-CM | POA: Diagnosis present

## 2021-08-14 DIAGNOSIS — J189 Pneumonia, unspecified organism: Secondary | ICD-10-CM | POA: Diagnosis present

## 2021-08-14 DIAGNOSIS — J9601 Acute respiratory failure with hypoxia: Secondary | ICD-10-CM | POA: Diagnosis present

## 2021-08-14 DIAGNOSIS — D509 Iron deficiency anemia, unspecified: Secondary | ICD-10-CM | POA: Diagnosis present

## 2021-08-14 DIAGNOSIS — E7800 Pure hypercholesterolemia, unspecified: Secondary | ICD-10-CM | POA: Diagnosis present

## 2021-08-14 DIAGNOSIS — Z96652 Presence of left artificial knee joint: Secondary | ICD-10-CM | POA: Diagnosis present

## 2021-08-14 DIAGNOSIS — I2510 Atherosclerotic heart disease of native coronary artery without angina pectoris: Secondary | ICD-10-CM | POA: Diagnosis present

## 2021-08-14 DIAGNOSIS — Y92009 Unspecified place in unspecified non-institutional (private) residence as the place of occurrence of the external cause: Secondary | ICD-10-CM | POA: Diagnosis not present

## 2021-08-14 DIAGNOSIS — I10 Essential (primary) hypertension: Secondary | ICD-10-CM | POA: Diagnosis present

## 2021-08-14 DIAGNOSIS — R7303 Prediabetes: Secondary | ICD-10-CM | POA: Diagnosis present

## 2021-08-14 DIAGNOSIS — N400 Enlarged prostate without lower urinary tract symptoms: Secondary | ICD-10-CM | POA: Diagnosis present

## 2021-08-14 DIAGNOSIS — Z20822 Contact with and (suspected) exposure to covid-19: Secondary | ICD-10-CM | POA: Diagnosis present

## 2021-08-14 DIAGNOSIS — Z9841 Cataract extraction status, right eye: Secondary | ICD-10-CM

## 2021-08-14 DIAGNOSIS — F0390 Unspecified dementia without behavioral disturbance: Secondary | ICD-10-CM | POA: Diagnosis present

## 2021-08-14 DIAGNOSIS — Z79890 Hormone replacement therapy (postmenopausal): Secondary | ICD-10-CM

## 2021-08-14 DIAGNOSIS — Z888 Allergy status to other drugs, medicaments and biological substances status: Secondary | ICD-10-CM

## 2021-08-14 DIAGNOSIS — R55 Syncope and collapse: Secondary | ICD-10-CM | POA: Diagnosis not present

## 2021-08-14 DIAGNOSIS — Z9842 Cataract extraction status, left eye: Secondary | ICD-10-CM

## 2021-08-14 DIAGNOSIS — Z955 Presence of coronary angioplasty implant and graft: Secondary | ICD-10-CM

## 2021-08-14 DIAGNOSIS — I6523 Occlusion and stenosis of bilateral carotid arteries: Secondary | ICD-10-CM | POA: Diagnosis present

## 2021-08-14 DIAGNOSIS — I739 Peripheral vascular disease, unspecified: Secondary | ICD-10-CM | POA: Diagnosis present

## 2021-08-14 DIAGNOSIS — A419 Sepsis, unspecified organism: Secondary | ICD-10-CM | POA: Diagnosis present

## 2021-08-14 DIAGNOSIS — K5900 Constipation, unspecified: Secondary | ICD-10-CM | POA: Diagnosis present

## 2021-08-14 DIAGNOSIS — W19XXXA Unspecified fall, initial encounter: Secondary | ICD-10-CM | POA: Diagnosis present

## 2021-08-14 DIAGNOSIS — R251 Tremor, unspecified: Secondary | ICD-10-CM | POA: Diagnosis present

## 2021-08-14 DIAGNOSIS — Z79899 Other long term (current) drug therapy: Secondary | ICD-10-CM

## 2021-08-14 DIAGNOSIS — R109 Unspecified abdominal pain: Secondary | ICD-10-CM | POA: Diagnosis present

## 2021-08-14 DIAGNOSIS — Z961 Presence of intraocular lens: Secondary | ICD-10-CM | POA: Diagnosis present

## 2021-08-14 DIAGNOSIS — N401 Enlarged prostate with lower urinary tract symptoms: Secondary | ICD-10-CM | POA: Diagnosis present

## 2021-08-14 DIAGNOSIS — Z7982 Long term (current) use of aspirin: Secondary | ICD-10-CM

## 2021-08-14 DIAGNOSIS — S0990XA Unspecified injury of head, initial encounter: Secondary | ICD-10-CM

## 2021-08-14 DIAGNOSIS — R350 Frequency of micturition: Secondary | ICD-10-CM | POA: Diagnosis present

## 2021-08-14 HISTORY — DX: Acute kidney failure, unspecified: N17.9

## 2021-08-14 LAB — HEPATIC FUNCTION PANEL
ALT: 41 U/L (ref 0–44)
AST: 30 U/L (ref 15–41)
Albumin: 3.5 g/dL (ref 3.5–5.0)
Alkaline Phosphatase: 51 U/L (ref 38–126)
Bilirubin, Direct: 0.3 mg/dL — ABNORMAL HIGH (ref 0.0–0.2)
Indirect Bilirubin: 1 mg/dL — ABNORMAL HIGH (ref 0.3–0.9)
Total Bilirubin: 1.3 mg/dL — ABNORMAL HIGH (ref 0.3–1.2)
Total Protein: 6.2 g/dL — ABNORMAL LOW (ref 6.5–8.1)

## 2021-08-14 LAB — CBC
HCT: 33.4 % — ABNORMAL LOW (ref 39.0–52.0)
Hemoglobin: 11.4 g/dL — ABNORMAL LOW (ref 13.0–17.0)
MCH: 32.4 pg (ref 26.0–34.0)
MCHC: 34.1 g/dL (ref 30.0–36.0)
MCV: 94.9 fL (ref 80.0–100.0)
Platelets: 255 10*3/uL (ref 150–400)
RBC: 3.52 MIL/uL — ABNORMAL LOW (ref 4.22–5.81)
RDW: 12.1 % (ref 11.5–15.5)
WBC: 22.5 10*3/uL — ABNORMAL HIGH (ref 4.0–10.5)
nRBC: 0 % (ref 0.0–0.2)

## 2021-08-14 LAB — D-DIMER, QUANTITATIVE: D-Dimer, Quant: 0.59 ug/mL-FEU — ABNORMAL HIGH (ref 0.00–0.50)

## 2021-08-14 LAB — URINALYSIS, ROUTINE W REFLEX MICROSCOPIC
Bacteria, UA: NONE SEEN
Bilirubin Urine: NEGATIVE
Glucose, UA: NEGATIVE mg/dL
Ketones, ur: NEGATIVE mg/dL
Nitrite: NEGATIVE
Protein, ur: NEGATIVE mg/dL
Specific Gravity, Urine: 1.01 (ref 1.005–1.030)
pH: 5 (ref 5.0–8.0)

## 2021-08-14 LAB — TYPE AND SCREEN
ABO/RH(D): A POS
Antibody Screen: NEGATIVE

## 2021-08-14 LAB — BASIC METABOLIC PANEL
Anion gap: 8 (ref 5–15)
BUN: 47 mg/dL — ABNORMAL HIGH (ref 8–23)
CO2: 22 mmol/L (ref 22–32)
Calcium: 8.6 mg/dL — ABNORMAL LOW (ref 8.9–10.3)
Chloride: 98 mmol/L (ref 98–111)
Creatinine, Ser: 1.62 mg/dL — ABNORMAL HIGH (ref 0.61–1.24)
GFR, Estimated: 40 mL/min — ABNORMAL LOW (ref >60)
Glucose, Bld: 116 mg/dL — ABNORMAL HIGH (ref 70–99)
Potassium: 4.3 mmol/L (ref 3.5–5.1)
Sodium: 128 mmol/L — ABNORMAL LOW (ref 135–145)

## 2021-08-14 LAB — T4, FREE: Free T4: 2 ng/dL — ABNORMAL HIGH (ref 0.61–1.12)

## 2021-08-14 LAB — PROTIME-INR
INR: 1.2 (ref 0.8–1.2)
Prothrombin Time: 14.7 seconds (ref 11.4–15.2)

## 2021-08-14 LAB — TSH: TSH: 0.987 u[IU]/mL (ref 0.350–4.500)

## 2021-08-14 LAB — RESP PANEL BY RT-PCR (FLU A&B, COVID) ARPGX2
Influenza A by PCR: NEGATIVE
Influenza B by PCR: NEGATIVE
SARS Coronavirus 2 by RT PCR: NEGATIVE

## 2021-08-14 LAB — LACTIC ACID, PLASMA: Lactic Acid, Venous: 1.4 mmol/L (ref 0.5–1.9)

## 2021-08-14 LAB — APTT: aPTT: 29 seconds (ref 24–36)

## 2021-08-14 LAB — TROPONIN I (HIGH SENSITIVITY)
Troponin I (High Sensitivity): 11 ng/L (ref <18)
Troponin I (High Sensitivity): 10 ng/L (ref <18)

## 2021-08-14 LAB — LIPASE, BLOOD: Lipase: 30 U/L (ref 11–51)

## 2021-08-14 IMAGING — CR DG CHEST 2V
1 series · 2 of 2 positions shown · non-contrast
Comparison: Chest radiograph [DATE]

CLINICAL DATA: Shortness of breath and fall.

EXAM:
CHEST - 2 VIEW

[Series 1: dg chest 2 view · 0.14mm/px · 2 of 2 slices shown]
[im 1/2]
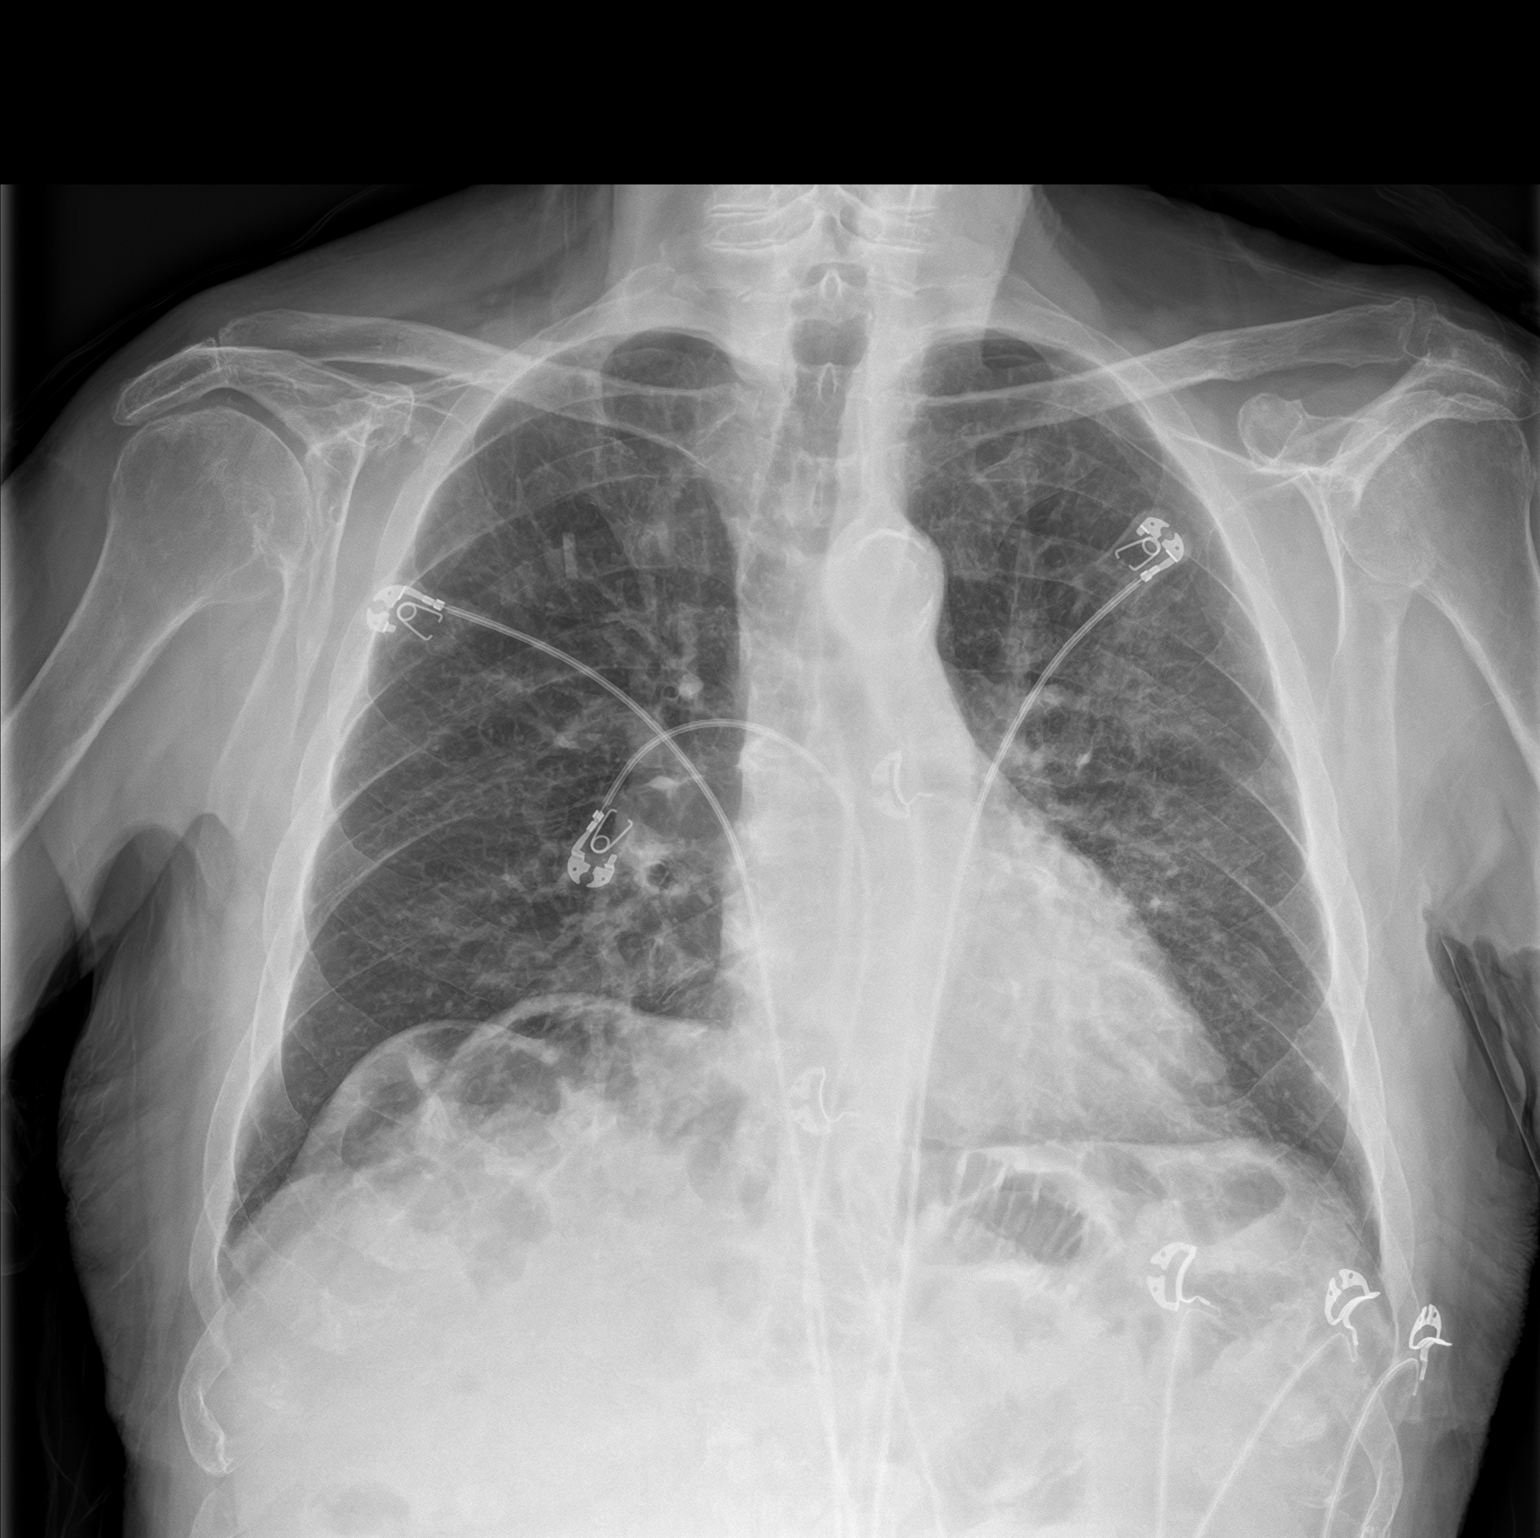
[im 2/2]
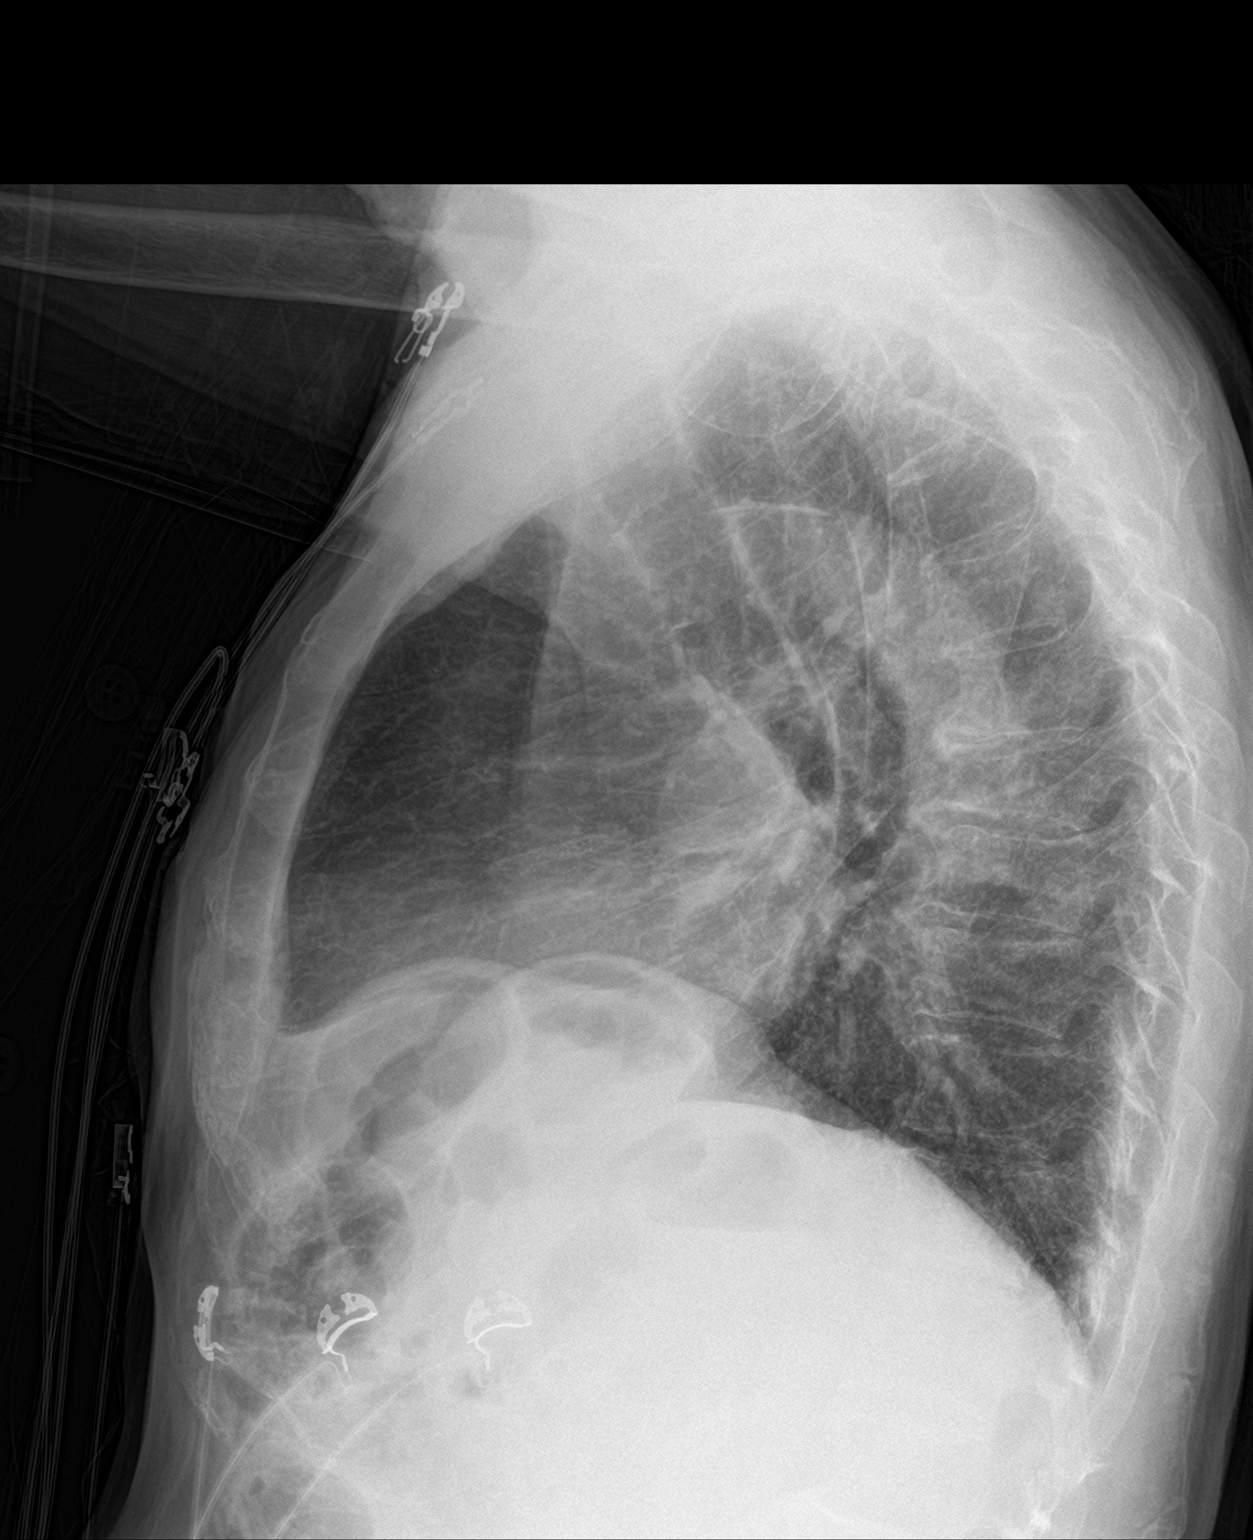

[2 of 2 positions shown; findings below may reference images not displayed]

FINDINGS: The cardiomediastinal contours are normal. Atherosclerosis of the
thoracic aorta. There are ill-defined left perihilar opacities.
Pulmonary vasculature is normal. No pneumothorax. No pleural
effusion. Chronic wedge deformity of the midthoracic spine. No acute
osseous abnormalities are seen.
IMPRESSION: Ill-defined left perihilar opacities, atelectasis versus pneumonia.
Pulmonary contusion is also considered in the setting of fall.

## 2021-08-14 IMAGING — CT CT CERVICAL SPINE W/O CM
2 series · 13 of 27 positions shown, 16 images · non-contrast
Comparison: None

CLINICAL DATA: Fell today, uncertain if he tripped or had a
syncopal episode, dizzy after fall. History hypertension, coronary
artery disease post MI, stage III chronic kidney disease



[Series 4: c spine soft · axial · 0.44mm/px · z∈[+937,+1093]mm · 8 of 94 slices shown, 10 images]
[im 8/94  soft-tissue]
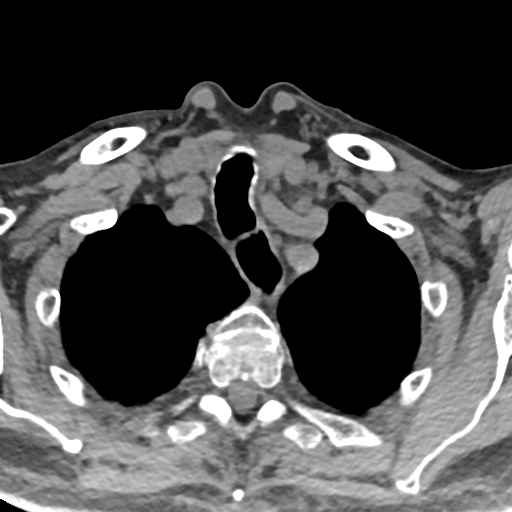
[im 8/94  bone]
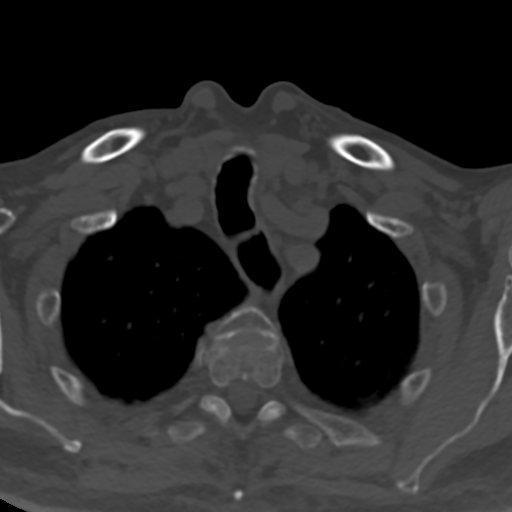
[im 22/94  bone]
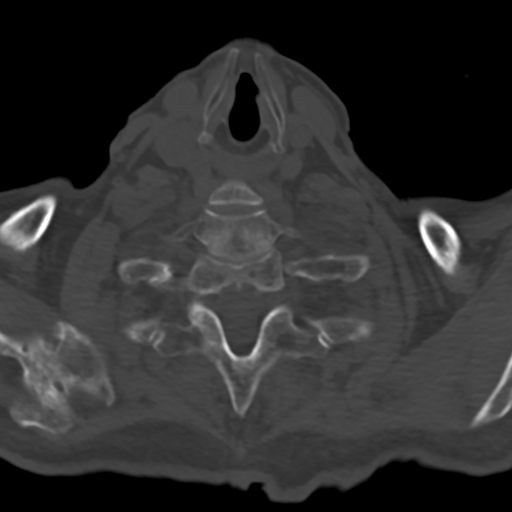
[im 29/94  bone]
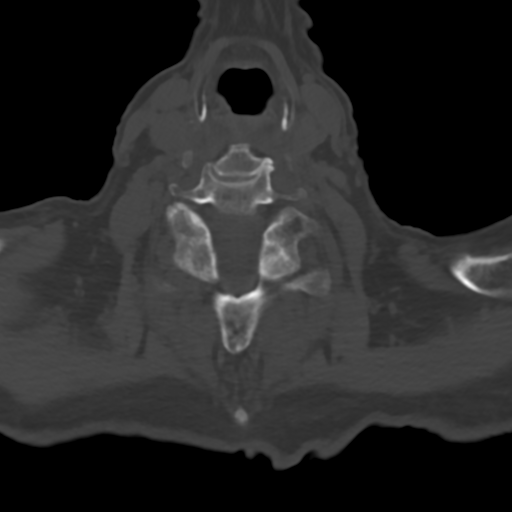
[im 43/94  bone]
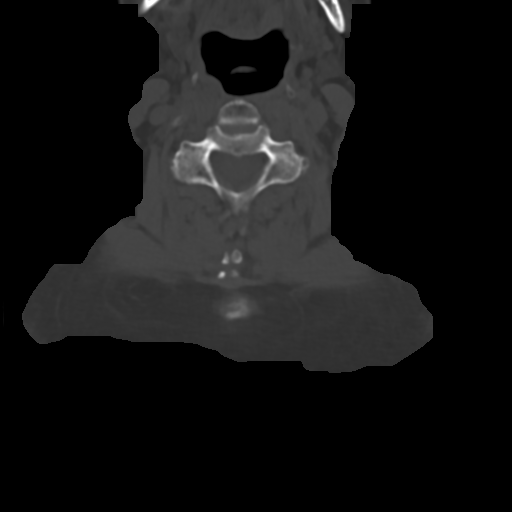
[im 51/94  soft-tissue]
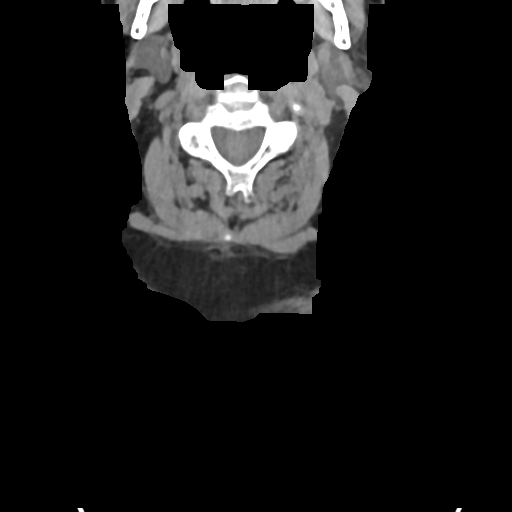
[im 51/94  bone]
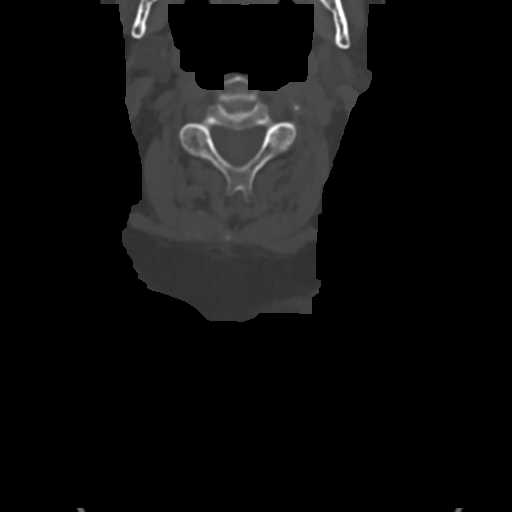
[im 65/94  bone]
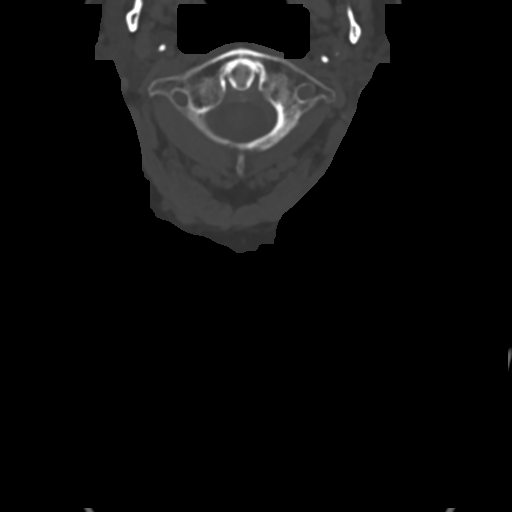
[im 72/94  bone]
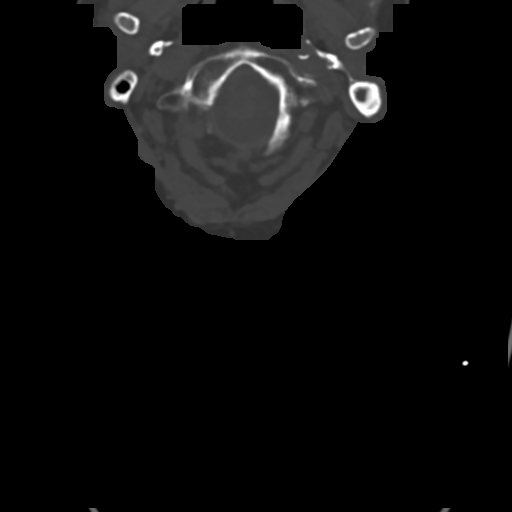
[im 86/94  bone]
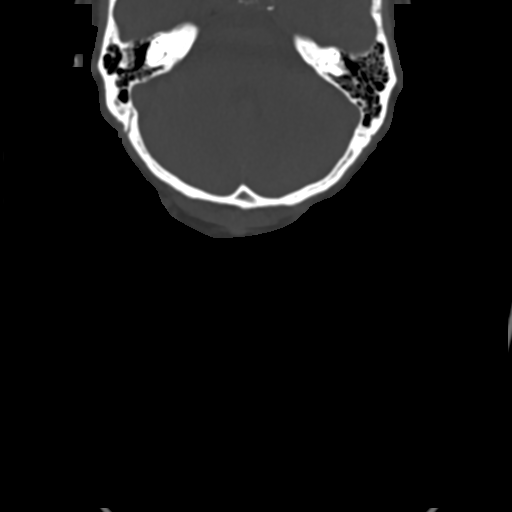

[Series 7: sag bone · sagittal · 0.26mm/px · 5 of 51 slices shown, 6 images]
[im 17/51  bone]
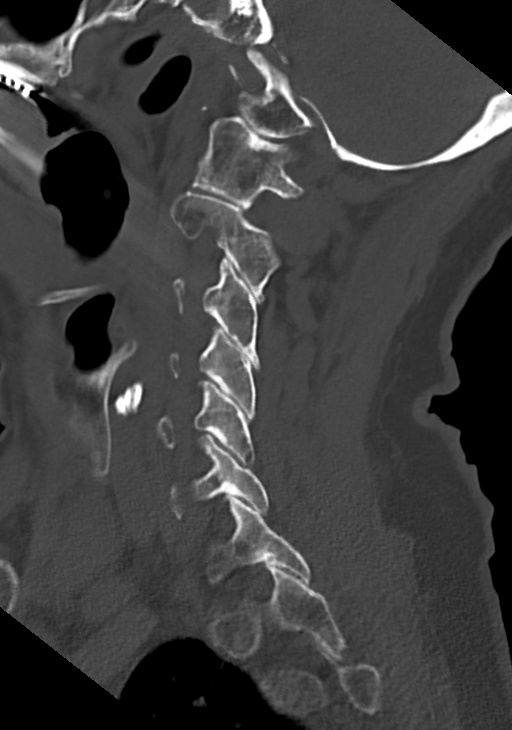
[im 21/51  bone]
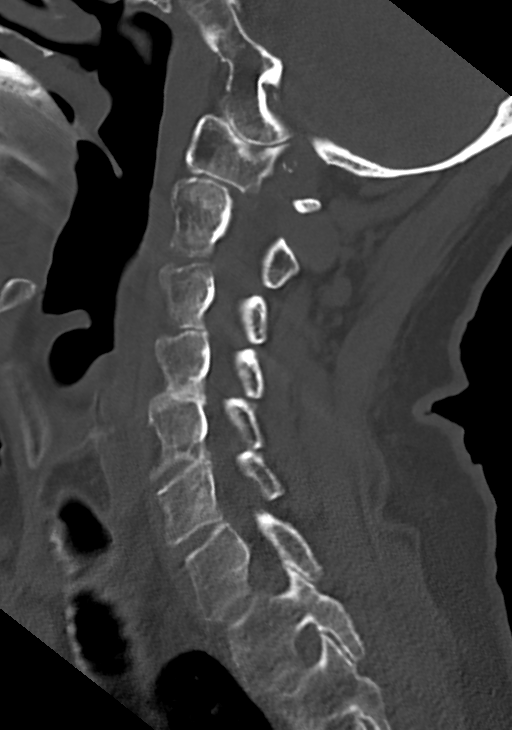
[im 26/51  soft-tissue]
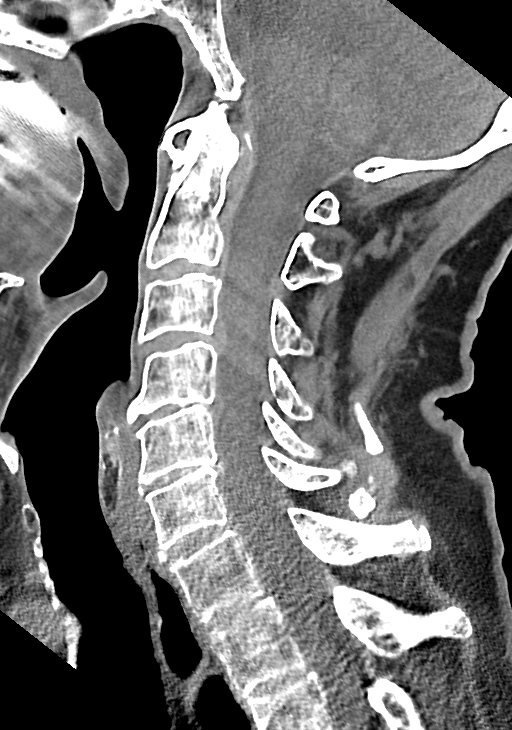
[im 26/51  bone]
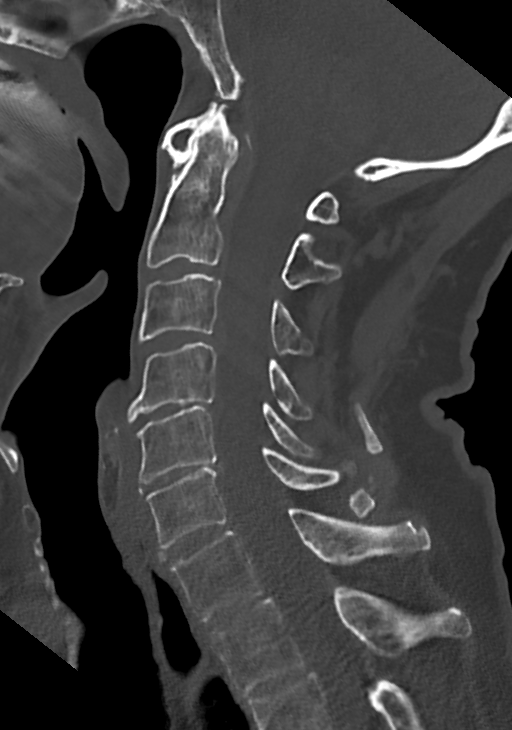
[im 30/51  bone]
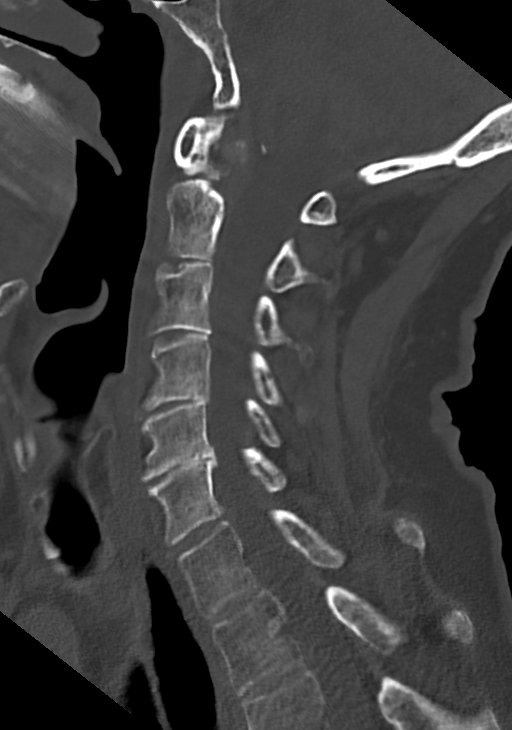
[im 34/51  bone]
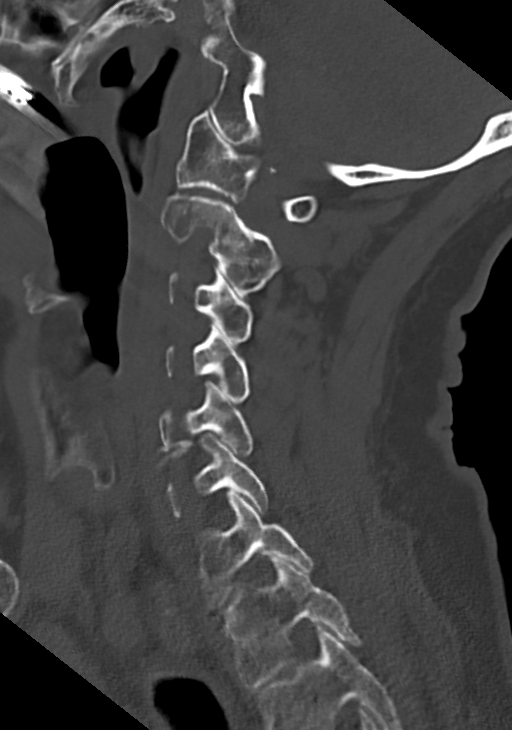

[13 of 27 positions shown; findings below may reference images not displayed]

FINDINGS: CT HEAD FINDINGS

Brain: Generalized atrophy. Normal ventricular morphology. No
midline shift or mass effect. Otherwise normal appearance of brain
parenchyma. No intracranial hemorrhage, mass lesion, or evidence of
acute infarction. No extra-axial fluid collections.

Vascular: Atherosclerotic calcification of internal carotid and LEFT
vertebral arteries at skull base

Skull: Demineralized but intact

Other: N/A

CT MAXILLOFACIAL FINDINGS

Osseous: Scattered beam hardening artifacts of dental origin.
Osseous demineralization. TMJ alignment normal bilaterally. Partial
slight nasal septal deviation to the RIGHT. No facial bone
fractures. Denture plates.

Orbits: Bony orbits intact.  No intraorbital fluid or pneumatosis.

Sinuses: Paranasal sinuses, mastoid air cells, and middle ear
cavities clear.

Soft tissues: RIGHT periorbital soft tissue swelling extending
supraorbital.

CT CERVICAL SPINE FINDINGS

Alignment: Normal

Skull base and vertebrae: Osseous demineralization. Vertebral body
heights maintained. Scattered disc space narrowing with tiny
anterior endplate spurs at C4-C5. Multilevel facet degenerative
changes. Visualized skull base intact. No fracture, subluxation, or
bone destruction.

Soft tissues and spinal canal: Prevertebral soft tissues normal
thickness. Atherosclerotic calcifications at carotid bifurcations
and LEFT vertebral artery as well as cranial aspect of aortic arch

Disc levels:  No specific abnormalities

Upper chest: LEFT upper lobe infiltrate

Other: N/A
IMPRESSION: Generalized atrophy.

No acute intracranial abnormalities.

RIGHT periorbital soft tissue swelling without acute facial bone
abnormalities.

Degenerative disc and facet disease changes of the cervical spine.

No acute cervical spine abnormalities.

LEFT upper lobe infiltrate.

Aortic Atherosclerosis ([7P]-[7P]).

## 2021-08-14 IMAGING — CT CT ABD-PELV W/ CM
2 of 5 series · 14 of 46 positions shown, 16 images · IV contrast (agent unspecified)
Comparison: CT abdomen and pelvis [DATE]

CLINICAL DATA: Abdominal pain.  New rectal bleeding.

EXAM:
CT ABDOMEN AND PELVIS WITH CONTRAST
TECHNIQUE: Multidetector CT imaging of the abdomen and pelvis was performed
using the standard protocol following bolus administration of
intravenous contrast.

[Series 2: abdomen 5.0 · axial · 0.78mm/px · z∈[+358,+823]mm · 11 of 109 slices shown, 13 images]
[im 8/109  soft-tissue]
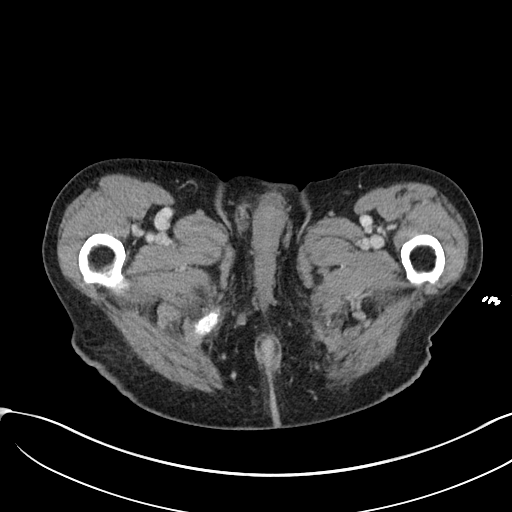
[im 8/109  bone]
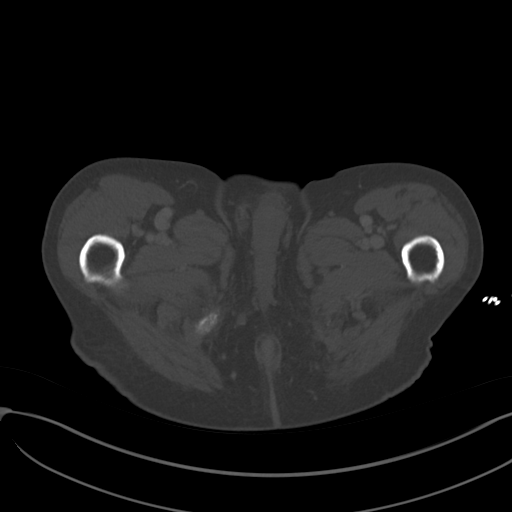
[im 15/109  soft-tissue]
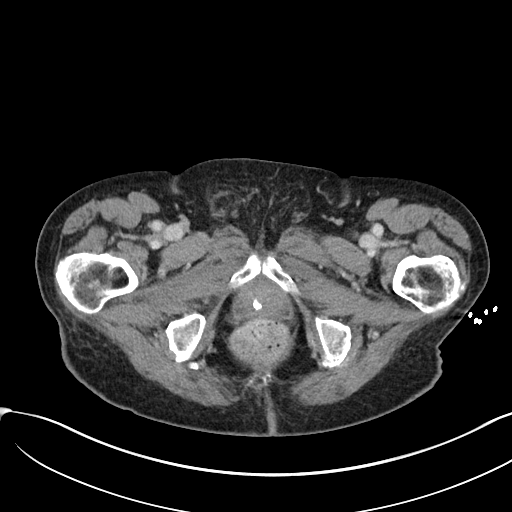
[im 29/109  soft-tissue]
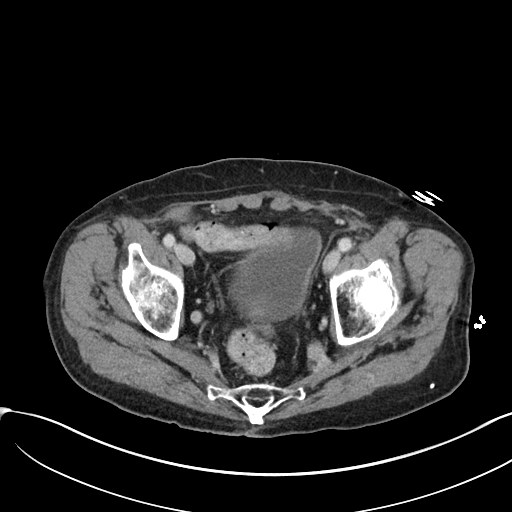
[im 37/109  soft-tissue]
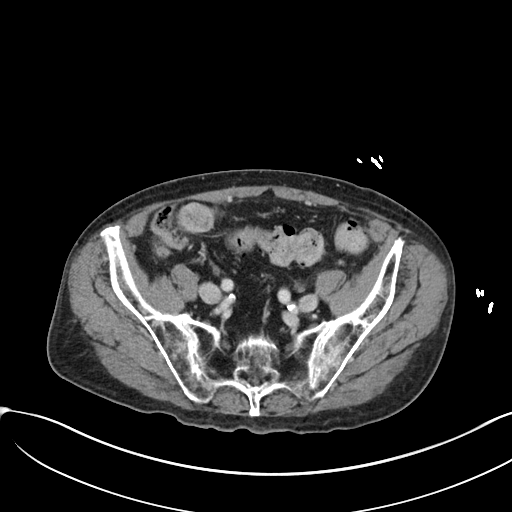
[im 44/109  soft-tissue]
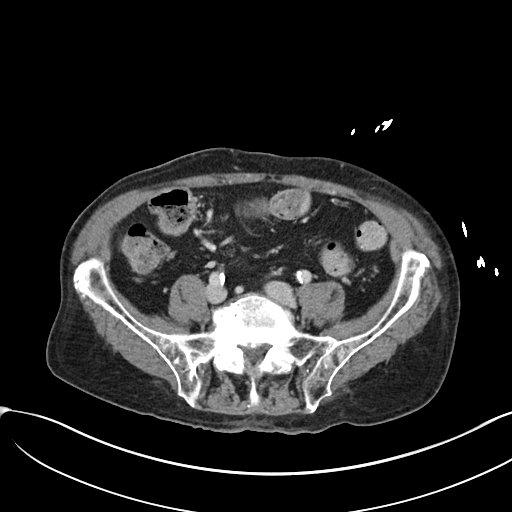
[im 58/109  soft-tissue]
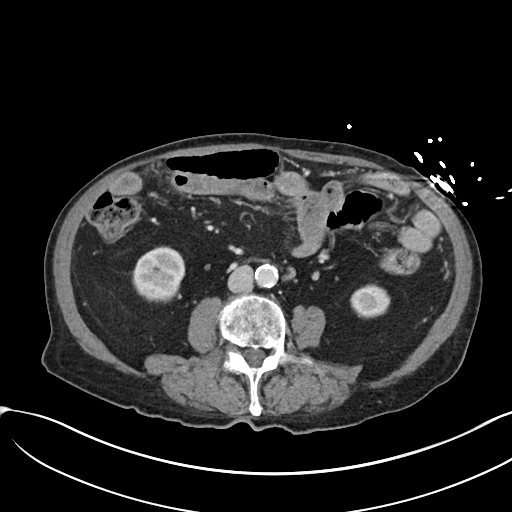
[im 65/109  soft-tissue]
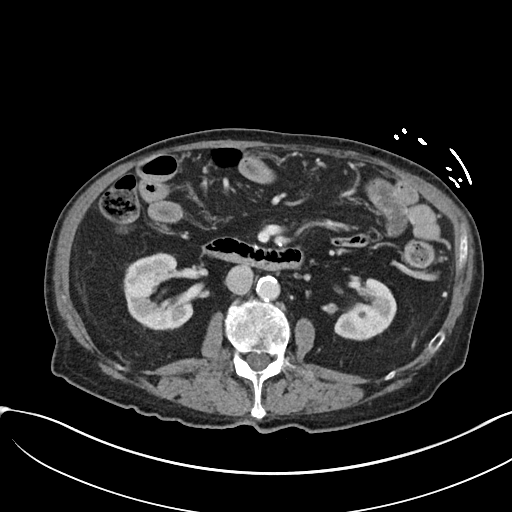
[im 73/109  soft-tissue]
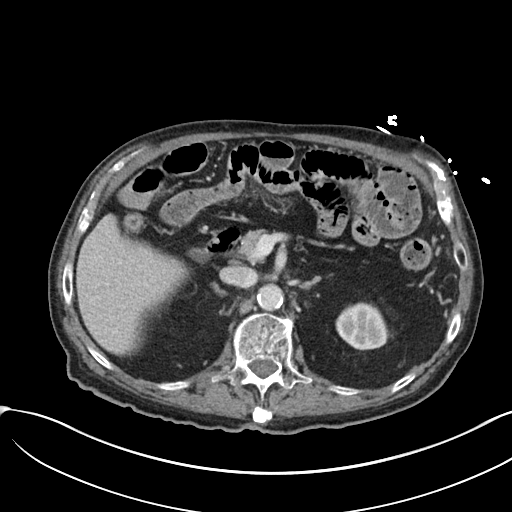
[im 80/109  soft-tissue]
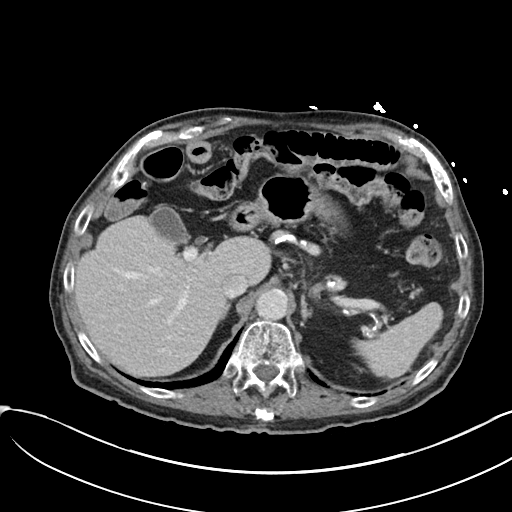
[im 80/109  bone]
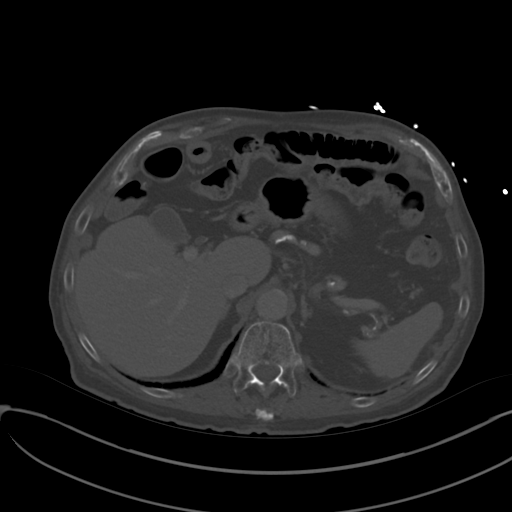
[im 94/109  soft-tissue]
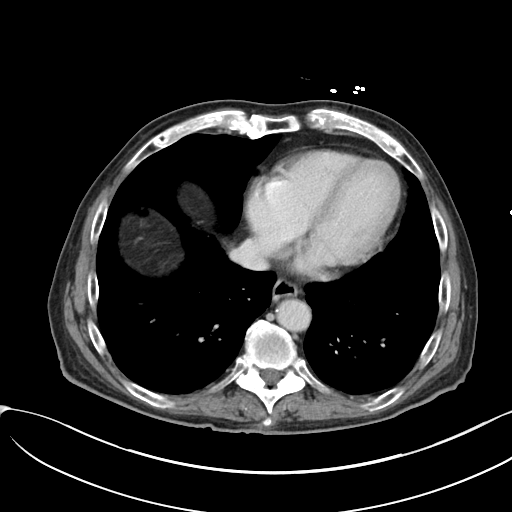
[im 101/109  soft-tissue]
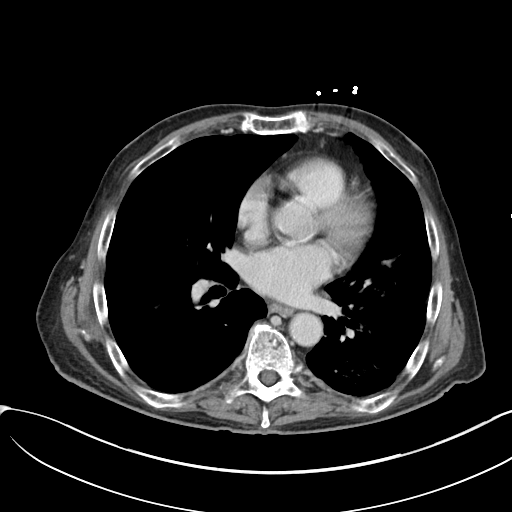

[Series 5: abdomen 3.0 mpr cor · coronal · 0.74mm/px · 3 of 85 slices shown]
[im 29/85  soft-tissue]
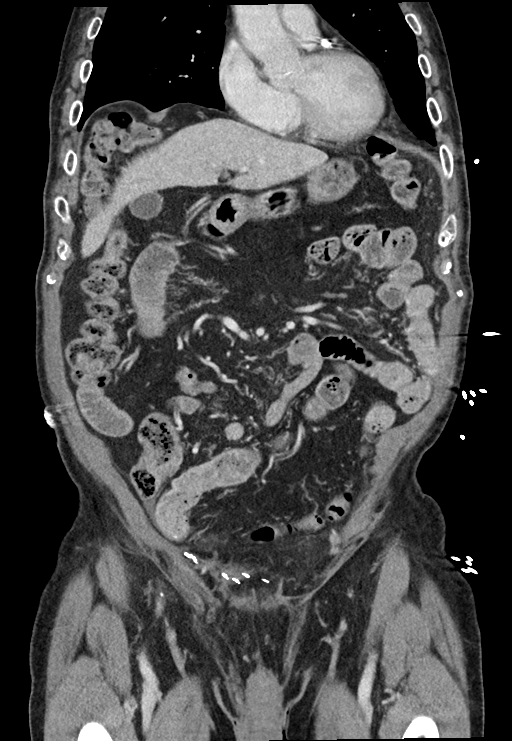
[im 38/85  soft-tissue]
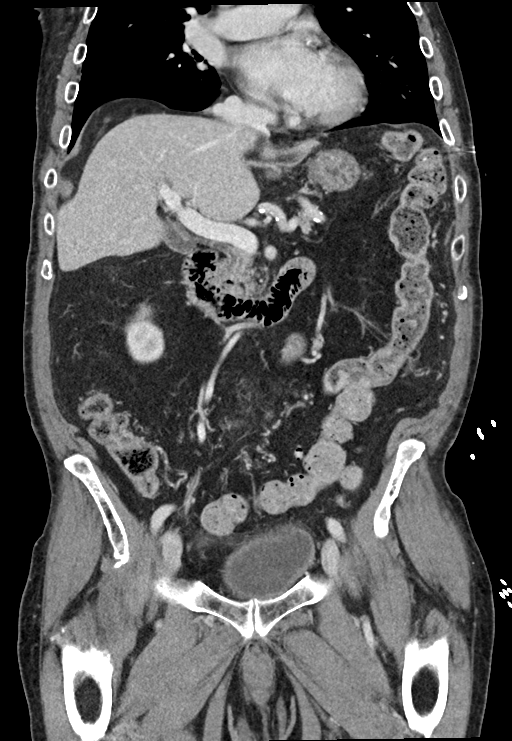
[im 47/85  soft-tissue]
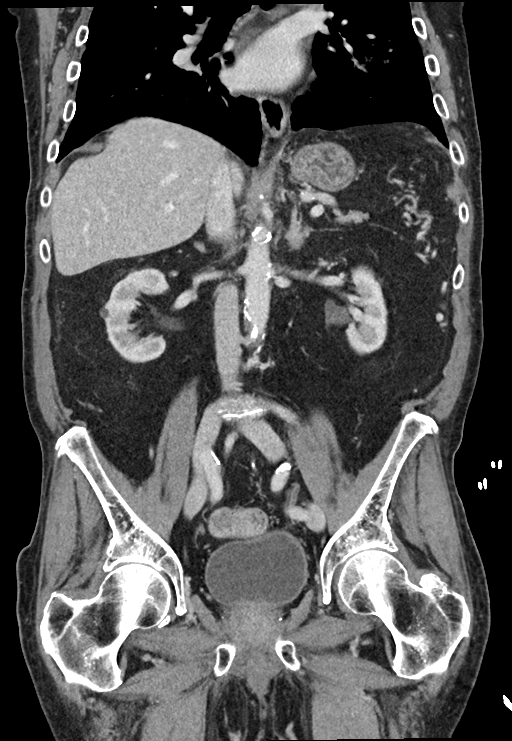

[14 of 46 positions shown; findings below may reference images not displayed]

RADIATION DOSE REDUCTION: This exam was performed according to the
departmental dose-optimization program which includes automated
exposure control, adjustment of the mA and/or kV according to
patient size and/or use of iterative reconstruction technique.

CONTRAST:  75mL OMNIPAQUE IOHEXOL 300 MG/ML  SOLN
FINDINGS: Lower chest: Parenchymal opacities in the superior segment of left
lower lobe and in the posterior left upper lobe. This is suggestive
for airspace disease and pneumonia. Subpleural nodule in the right
lower lobe on sequence 4, image 32 measures 5 mm and stable since
[TH]. Additional stable subpleural nodule in the left lower lobe on
image 51. Focal density along the right minor fissure on image 15 is
likely an incidental finding.

Hepatobiliary: Normal appearance of the liver and gallbladder.
Portal venous system is patent. No biliary dilatation.

Pancreas: No evidence for pancreatic duct dilatation or inflammatory
changes. Question a 6 mm low-density or cystic structure in the
pancreatic body on sequence 2 image 33 and this may have been
present on the previous examination.

Spleen: Normal in size without focal abnormality.

Adrenals/Urinary Tract: Normal adrenal glands. Cyst in the right
kidney upper pole. Peripheral low-density structure in the right
kidney interpolar region measures 0.6 cm and too small to
definitively characterize. Evidence for cysts in the left kidney
lower pole. These renal structures do not require follow-up.
Negative for hydronephrosis. Mild wall thickening in the urinary
bladder.

Stomach/Bowel: Normal appearance of the stomach. Mildly dilated
loops of small bowel in the anterior left abdomen. No evidence for
an obstruction or focal bowel inflammation. Cecum appears to be
located in the anterior right abdomen.

Vascular/Lymphatic: Aortic atherosclerosis. No enlarged abdominal or
pelvic lymph nodes.

Reproductive: Prostate is enlarged with calcifications. Prostate
measures 5.1 cm in transverse dimension.

Other: Right inguinal hernia with small amount of fluid and/or
stranding. Again noted are postoperative changes along the anterior
pelvis and could be related to prior hernia repair. Trace fluid in
the right lower quadrant of the abdomen on sequence 2 image 75.
Trace pelvic fluid. Negative for free air.

Musculoskeletal: Disc space narrowing at L4-L5 and L5-S1. Old
vertebral body compression deformity at T7 which was present on a
chest radiograph from [DATE]. Age-indeterminate compression
deformity at T9. Bone structures are slightly heterogeneous and may
be associated osteopenia.
IMPRESSION: 1. Airspace disease in the superior segment of the left lower lobe
and posterior left upper lobe. Findings are suggestive for left lung
pneumonia.
2. Right inguinal hernia containing a small amount of fluid and/or
stranding.
3. Trace fluid in the right lower quadrant of the abdomen and in the
pelvis. Etiology is indeterminate but could be related to occult
inflammatory process in the abdomen or pelvis.
4. Bilateral renal cysts.  No follow-up needed.
5. Questionable hypodensity in the pancreatic body region. This may
have been present on the previous examination and likely an
incidental finding.
6. Old T7 vertebral body compression deformity. Age indeterminate
compression deformity along the superior endplate of T9.

## 2021-08-14 IMAGING — CT CT MAXILLOFACIAL W/O CM
3 of 6 series · 14 of 47 positions shown, 17 images · non-contrast
Comparison: None

CLINICAL DATA: Fell today, uncertain if he tripped or had a
syncopal episode, dizzy after fall. History hypertension, coronary
artery disease post MI, stage III chronic kidney disease



[Series 2: maxilllofacial 2.0 hr40 3 · axial · 0.33mm/px · z∈[+970,+1104]mm · 9 of 79 slices shown, 12 images]
[im 6/79  brain]
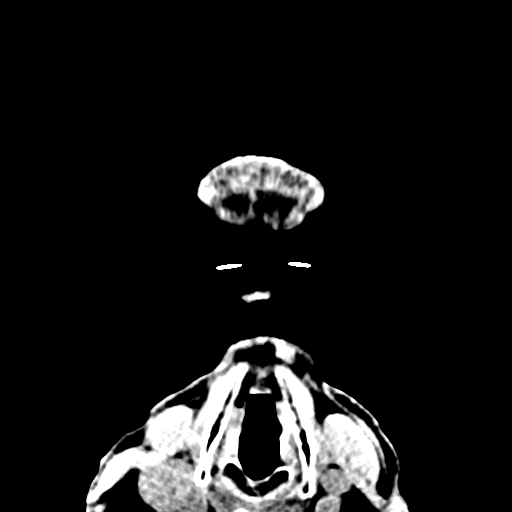
[im 6/79  bone]
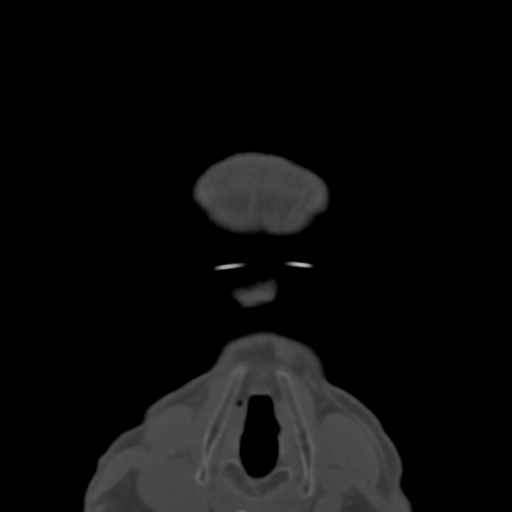
[im 17/79  bone]
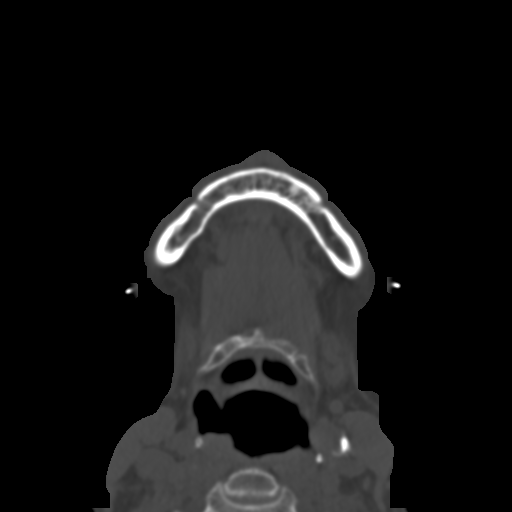
[im 23/79  bone]
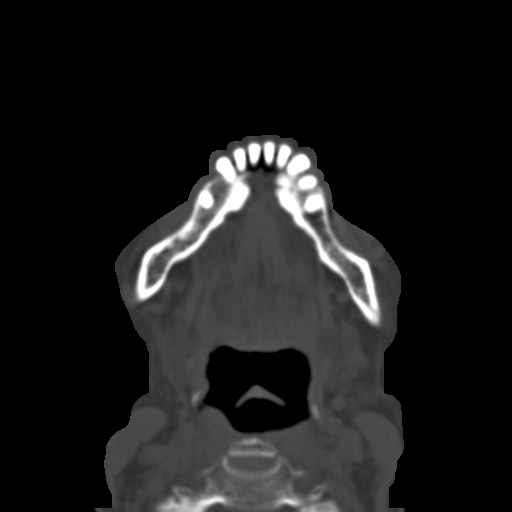
[im 34/79  bone]
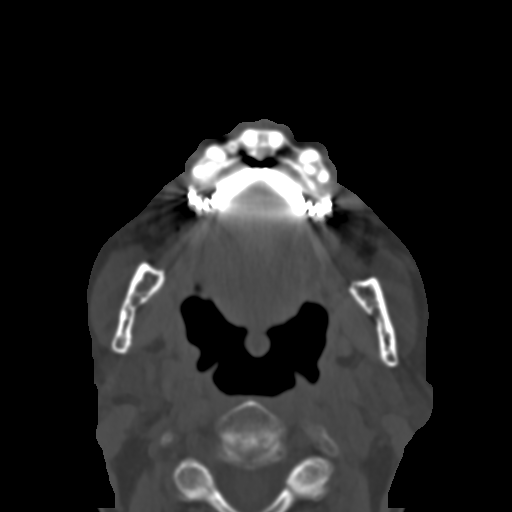
[im 40/79  brain]
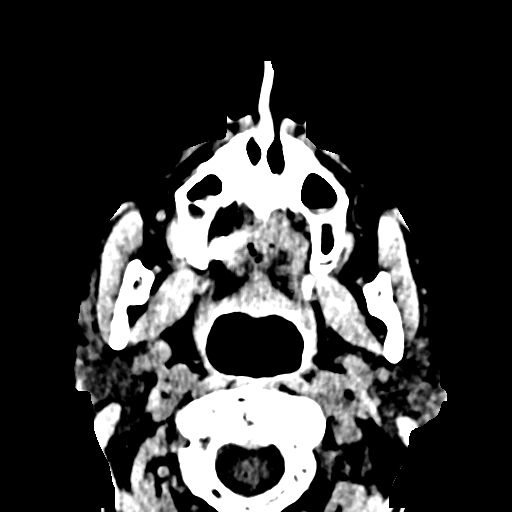
[im 40/79  bone]
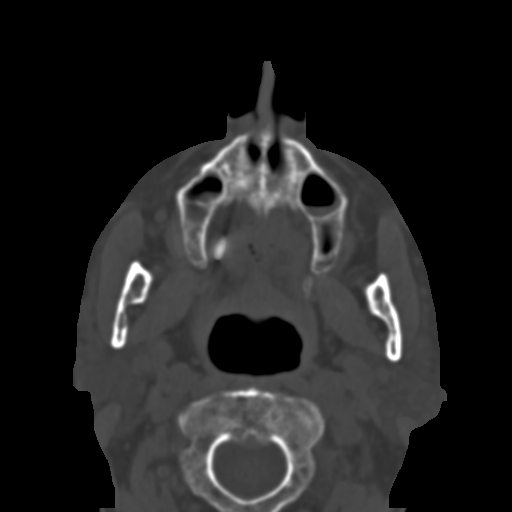
[im 45/79  bone]
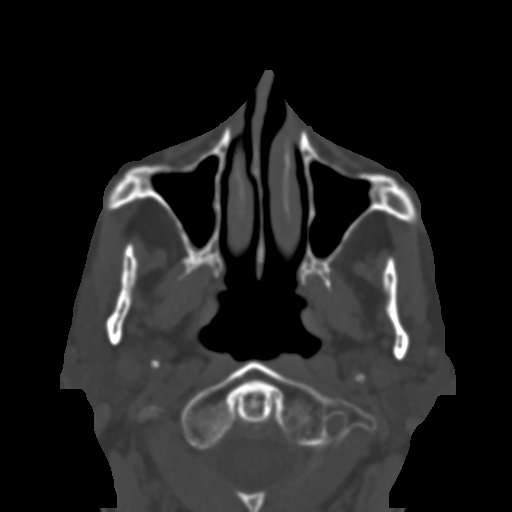
[im 56/79  bone]
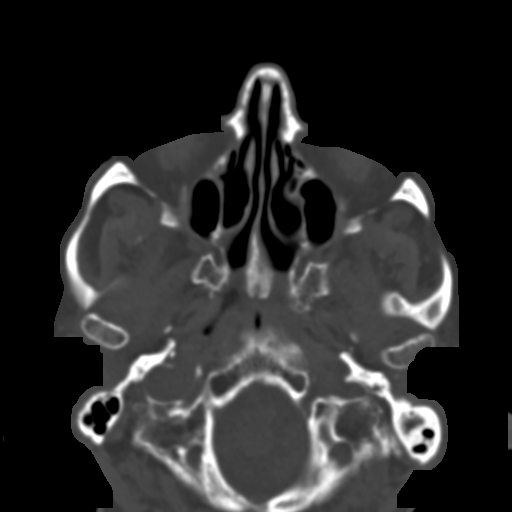
[im 62/79  bone]
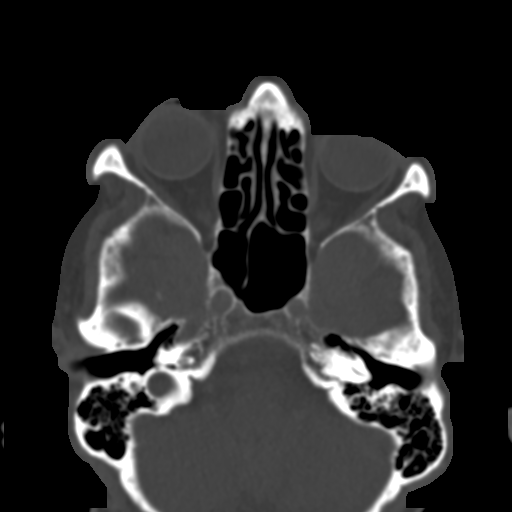
[im 73/79  brain]
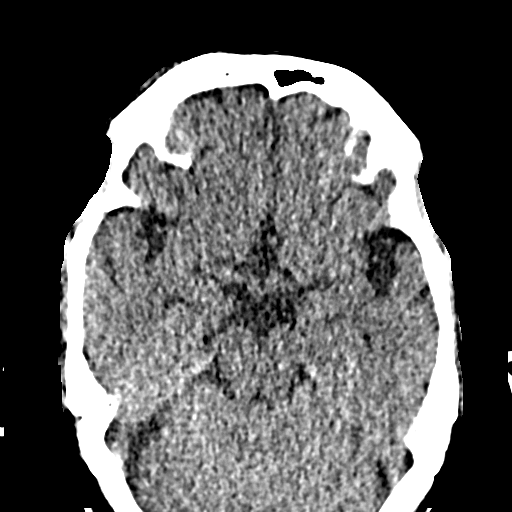
[im 73/79  bone]
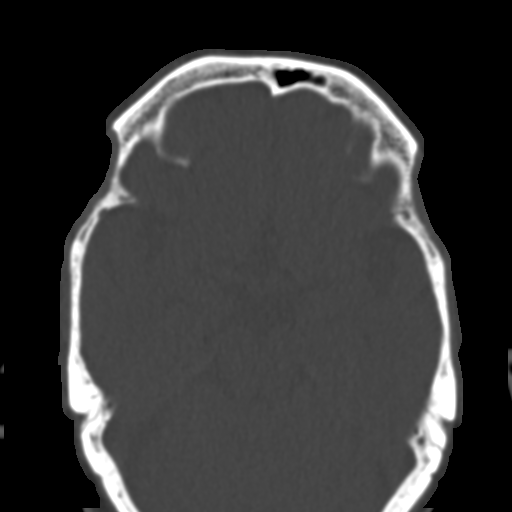

[Series 6: st cor · coronal · 0.35mm/px · 3 of 71 slices shown]
[im 18/71  bone]
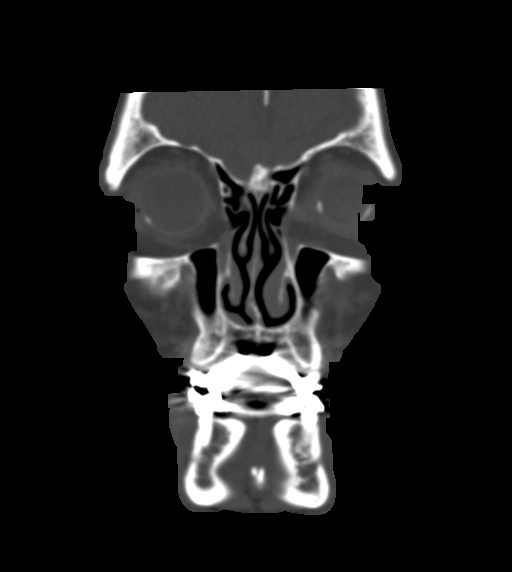
[im 36/71  bone]
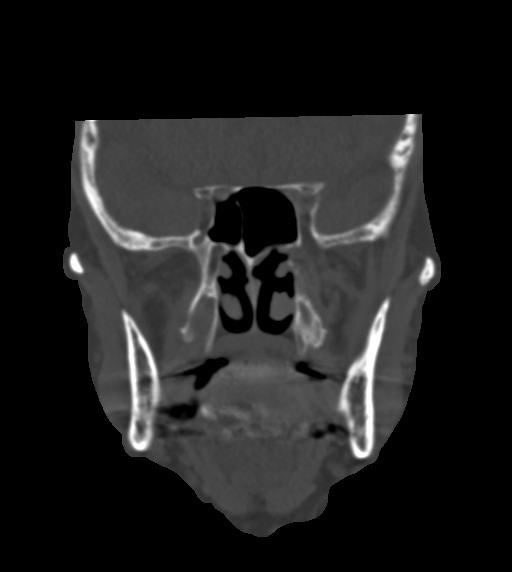
[im 53/71  bone]
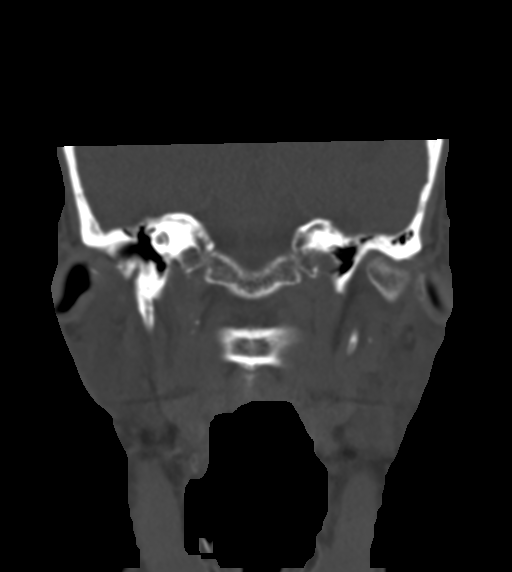

[Series 9: bone sag · sagittal · 0.34mm/px · 2 of 78 slices shown]
[im 26/78  bone]
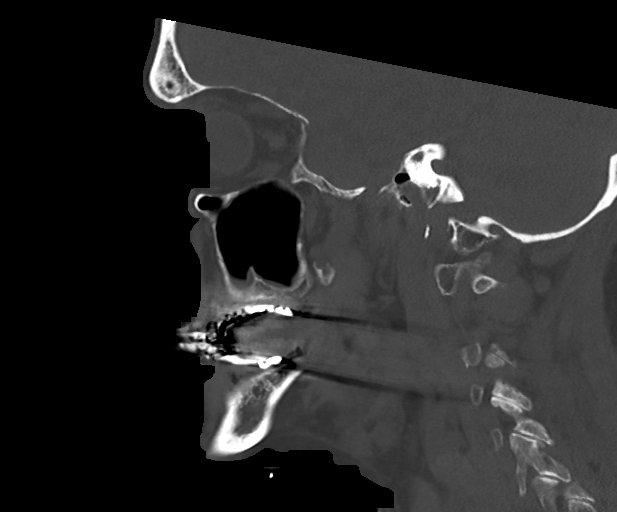
[im 52/78  bone]
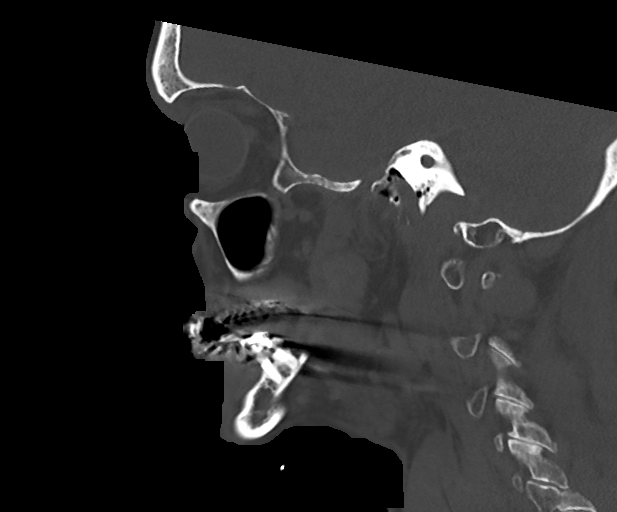

[14 of 47 positions shown; findings below may reference images not displayed]

FINDINGS: CT HEAD FINDINGS

Brain: Generalized atrophy. Normal ventricular morphology. No
midline shift or mass effect. Otherwise normal appearance of brain
parenchyma. No intracranial hemorrhage, mass lesion, or evidence of
acute infarction. No extra-axial fluid collections.

Vascular: Atherosclerotic calcification of internal carotid and LEFT
vertebral arteries at skull base

Skull: Demineralized but intact

Other: N/A

CT MAXILLOFACIAL FINDINGS

Osseous: Scattered beam hardening artifacts of dental origin.
Osseous demineralization. TMJ alignment normal bilaterally. Partial
slight nasal septal deviation to the RIGHT. No facial bone
fractures. Denture plates.

Orbits: Bony orbits intact.  No intraorbital fluid or pneumatosis.

Sinuses: Paranasal sinuses, mastoid air cells, and middle ear
cavities clear.

Soft tissues: RIGHT periorbital soft tissue swelling extending
supraorbital.

CT CERVICAL SPINE FINDINGS

Alignment: Normal

Skull base and vertebrae: Osseous demineralization. Vertebral body
heights maintained. Scattered disc space narrowing with tiny
anterior endplate spurs at C4-C5. Multilevel facet degenerative
changes. Visualized skull base intact. No fracture, subluxation, or
bone destruction.

Soft tissues and spinal canal: Prevertebral soft tissues normal
thickness. Atherosclerotic calcifications at carotid bifurcations
and LEFT vertebral artery as well as cranial aspect of aortic arch

Disc levels:  No specific abnormalities

Upper chest: LEFT upper lobe infiltrate

Other: N/A
IMPRESSION: Generalized atrophy.

No acute intracranial abnormalities.

RIGHT periorbital soft tissue swelling without acute facial bone
abnormalities.

Degenerative disc and facet disease changes of the cervical spine.

No acute cervical spine abnormalities.

LEFT upper lobe infiltrate.

Aortic Atherosclerosis ([7P]-[7P]).

## 2021-08-14 IMAGING — CT CT HEAD W/O CM
4 series · 14 of 47 positions shown, 16 images · non-contrast
Comparison: None

CLINICAL DATA: Fell today, uncertain if he tripped or had a
syncopal episode, dizzy after fall. History hypertension, coronary
artery disease post MI, stage III chronic kidney disease



[Series 2: head wo · axial · 0.46mm/px · z∈[+1081,+1201]mm · 7 of 33 slices shown, 9 images]
[im 5/33  brain]
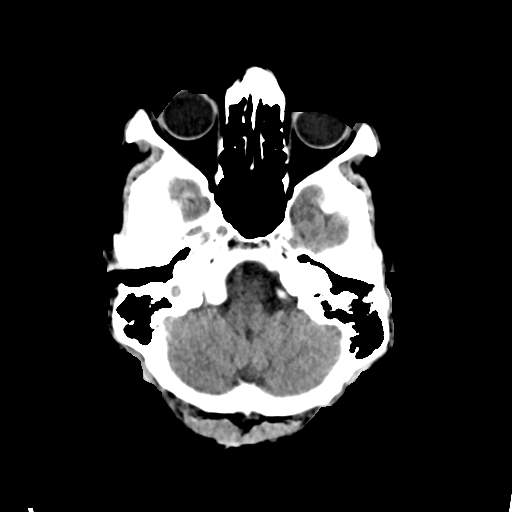
[im 5/33  bone]
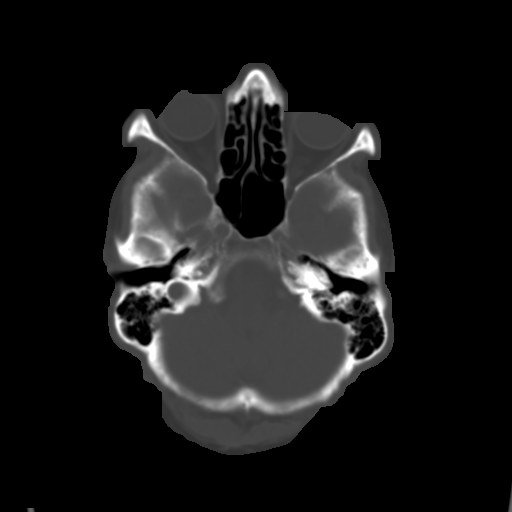
[im 9/33  brain]
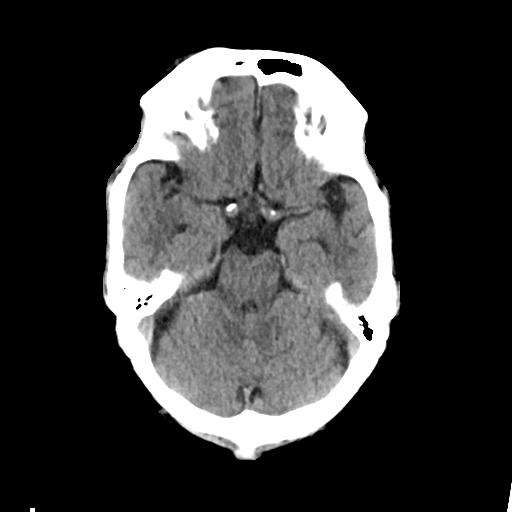
[im 13/33  brain]
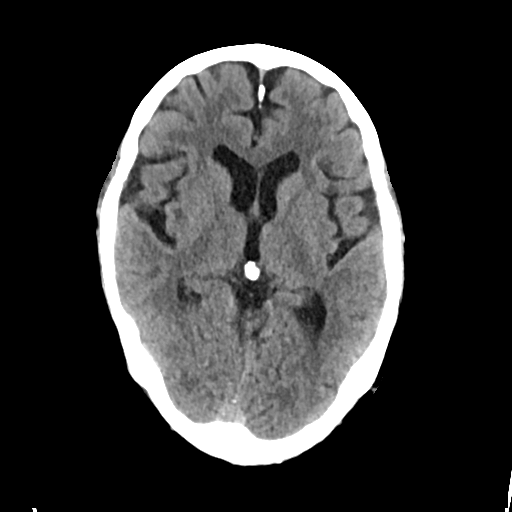
[im 17/33  brain]
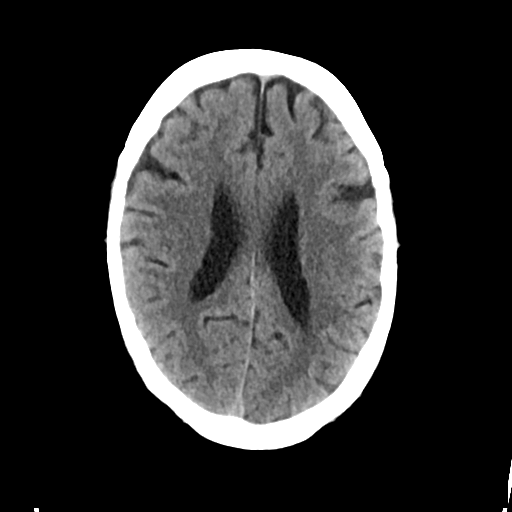
[im 21/33  brain]
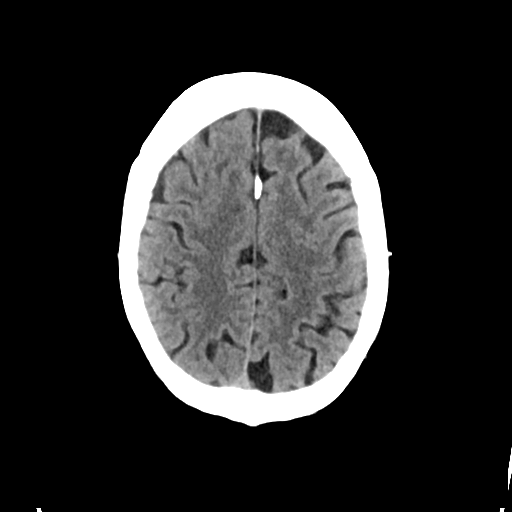
[im 21/33  bone]
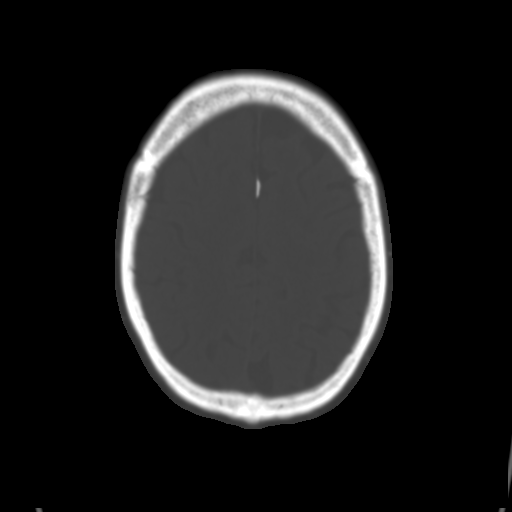
[im 25/33  brain]
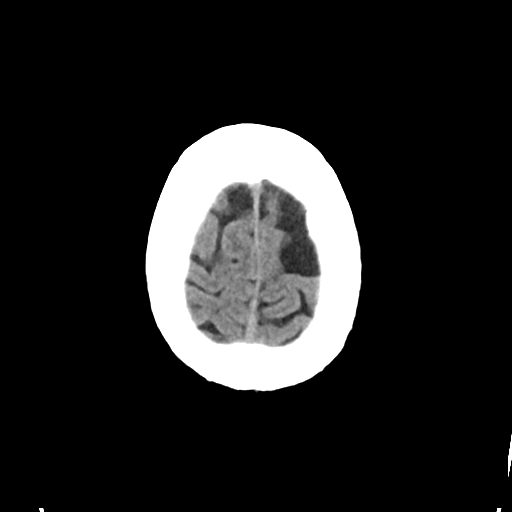
[im 29/33  brain]
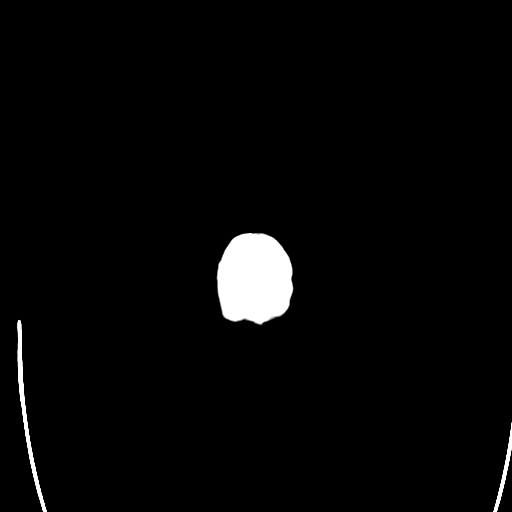

[Series 3: head bone · axial · 0.46mm/px · 1 of 82 slices shown]
[im 9/82  bone]
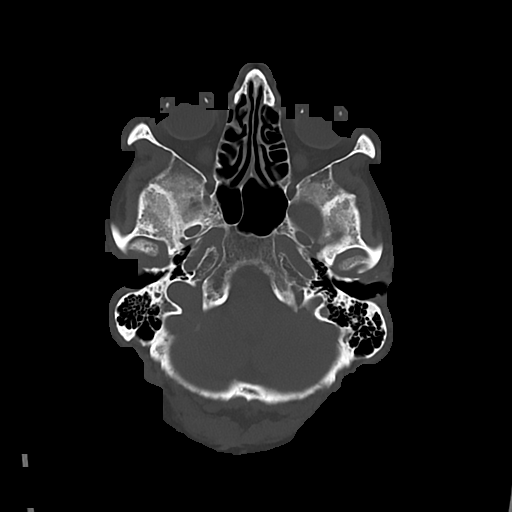

[Series 4: cor soft · coronal · 0.30mm/px · 3 of 65 slices shown]
[im 22/65  brain]
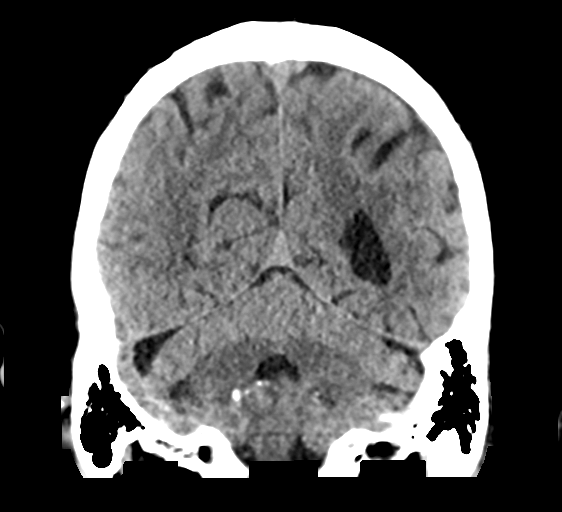
[im 29/65  brain]
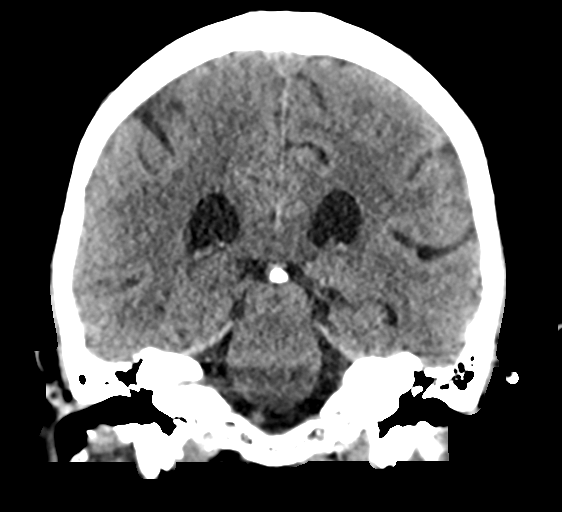
[im 36/65  brain]
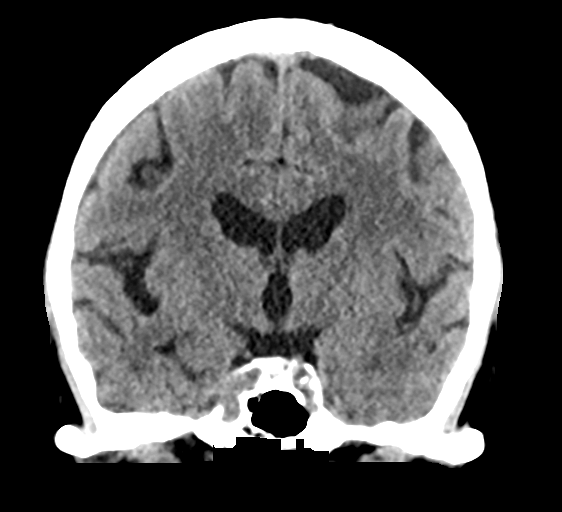

[Series 5: sag soft · sagittal · 0.31mm/px · 3 of 49 slices shown]
[im 17/49  brain]
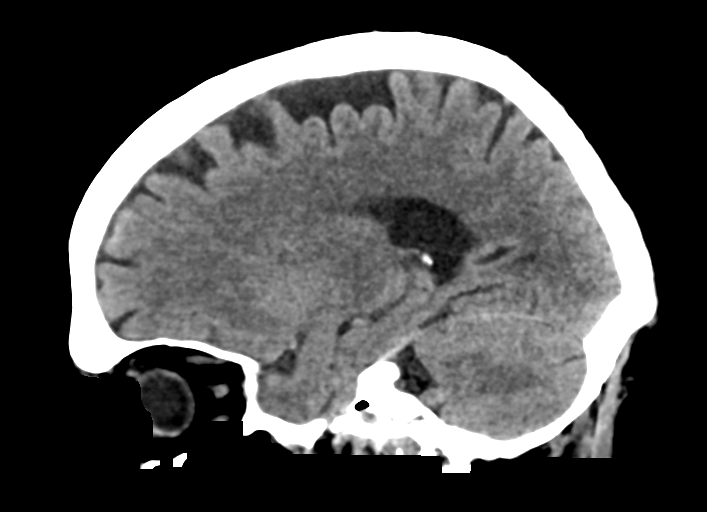
[im 25/49  brain]
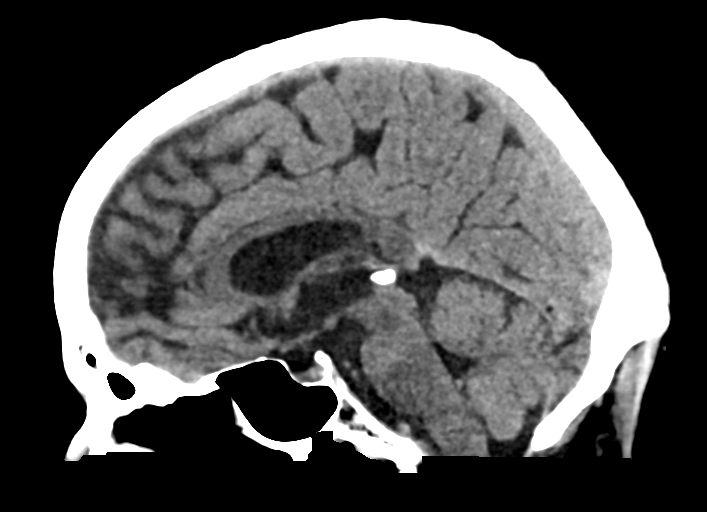
[im 33/49  brain]
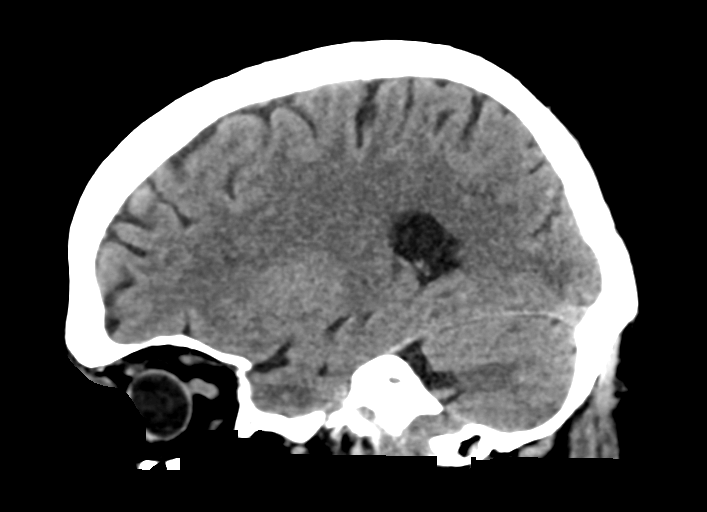

[14 of 47 positions shown; findings below may reference images not displayed]

FINDINGS: CT HEAD FINDINGS

Brain: Generalized atrophy. Normal ventricular morphology. No
midline shift or mass effect. Otherwise normal appearance of brain
parenchyma. No intracranial hemorrhage, mass lesion, or evidence of
acute infarction. No extra-axial fluid collections.

Vascular: Atherosclerotic calcification of internal carotid and LEFT
vertebral arteries at skull base

Skull: Demineralized but intact

Other: N/A

CT MAXILLOFACIAL FINDINGS

Osseous: Scattered beam hardening artifacts of dental origin.
Osseous demineralization. TMJ alignment normal bilaterally. Partial
slight nasal septal deviation to the RIGHT. No facial bone
fractures. Denture plates.

Orbits: Bony orbits intact.  No intraorbital fluid or pneumatosis.

Sinuses: Paranasal sinuses, mastoid air cells, and middle ear
cavities clear.

Soft tissues: RIGHT periorbital soft tissue swelling extending
supraorbital.

CT CERVICAL SPINE FINDINGS

Alignment: Normal

Skull base and vertebrae: Osseous demineralization. Vertebral body
heights maintained. Scattered disc space narrowing with tiny
anterior endplate spurs at C4-C5. Multilevel facet degenerative
changes. Visualized skull base intact. No fracture, subluxation, or
bone destruction.

Soft tissues and spinal canal: Prevertebral soft tissues normal
thickness. Atherosclerotic calcifications at carotid bifurcations
and LEFT vertebral artery as well as cranial aspect of aortic arch

Disc levels:  No specific abnormalities

Upper chest: LEFT upper lobe infiltrate

Other: N/A
IMPRESSION: Generalized atrophy.

No acute intracranial abnormalities.

RIGHT periorbital soft tissue swelling without acute facial bone
abnormalities.

Degenerative disc and facet disease changes of the cervical spine.

No acute cervical spine abnormalities.

LEFT upper lobe infiltrate.

Aortic Atherosclerosis ([7P]-[7P]).

## 2021-08-14 IMAGING — MR MR HEAD W/O CM
11 series · 46 of 48 positions shown · non-contrast
Comparison: Prior head CT from [DATE].

CLINICAL DATA: Initial evaluation for acute syncope, presyncope,
stroke suspected.

EXAM:
MRI HEAD WITHOUT CONTRAST
TECHNIQUE: Multiplanar, multiecho pulse sequences of the brain and surrounding
structures were obtained without intravenous contrast.

[Series 5: ax dwi_tracew · axial · 3.0mm · 0.65mm/px · z∈[-34,+116]mm · 4 of 48 slices shown]
[im 1/48]
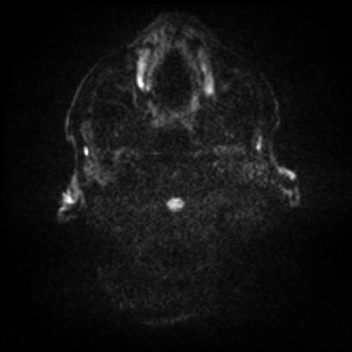
[im 16/48]
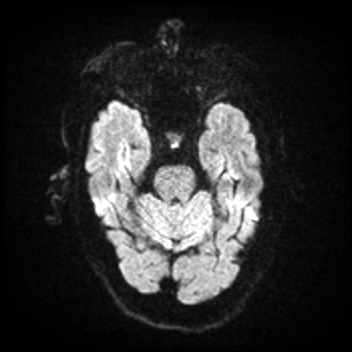
[im 32/48]
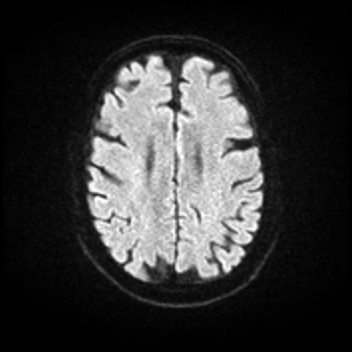
[im 48/48]
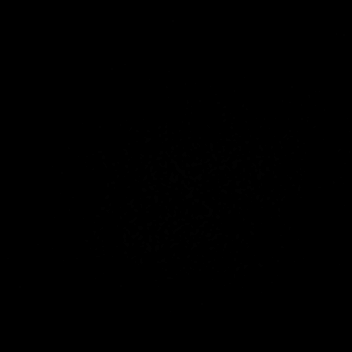

[Series 6: ax dwi_adc · axial · 3.0mm · 0.65mm/px · z∈[-34,+113]mm · 4 of 47 slices shown]
[im 1/47]
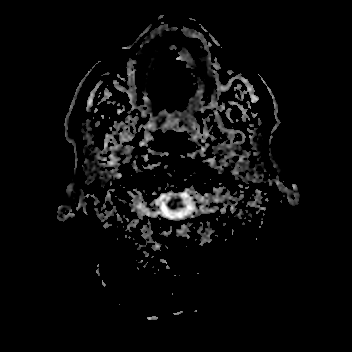
[im 16/47]
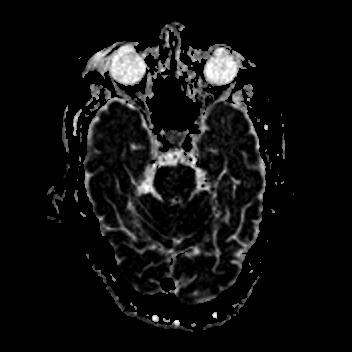
[im 31/47]
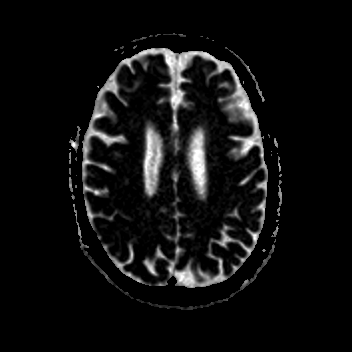
[im 47/47]
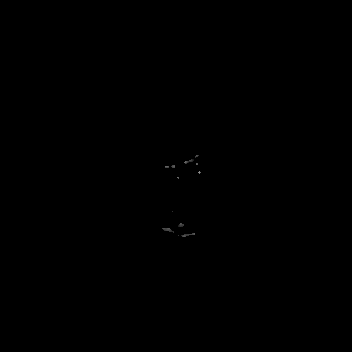

[Series 7: cor dwi_tracew · coronal · 5.0mm · 0.65mm/px · 3 of 40 slices shown]
[im 1/40]
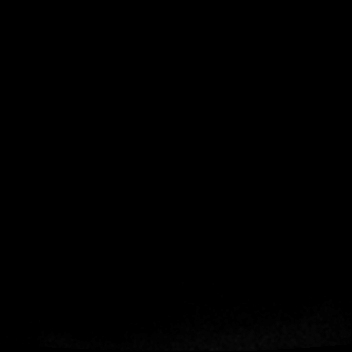
[im 20/40]
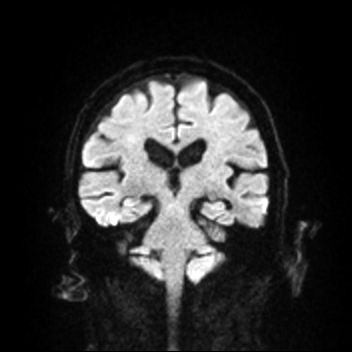
[im 40/40]
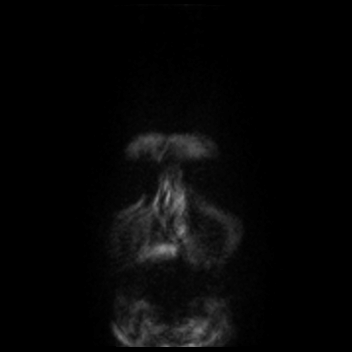

[Series 8: cor dwi_adc · coronal · 5.0mm · 0.65mm/px · 3 of 39 slices shown]
[im 1/39]
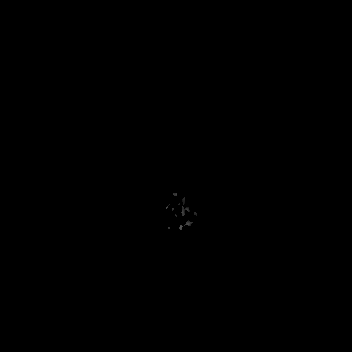
[im 20/39]
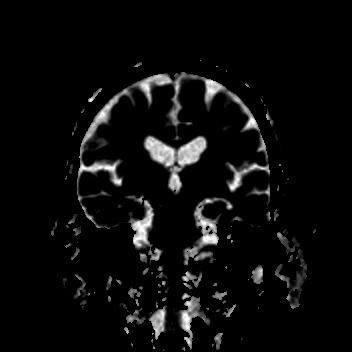
[im 39/39]
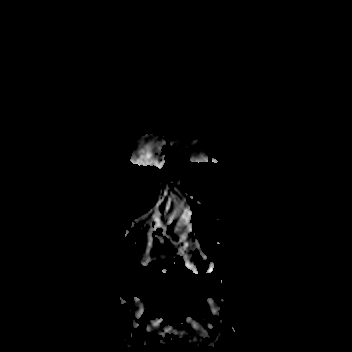

[Series 9: T1 · sagittal · 5.0mm · 0.62mm/px · 2 of 23 slices shown (1 of 2)]
[im 1/23]
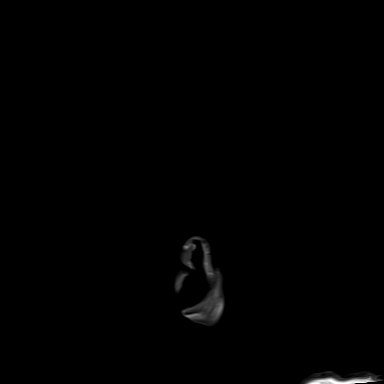
[im 23/23]
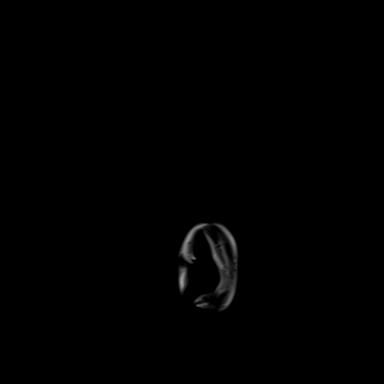

[Series 10: T2 · axial · 5.0mm · 0.53mm/px · z∈[-42,+109]mm · 2 of 27 slices shown (1 of 2)]
[im 1/27]
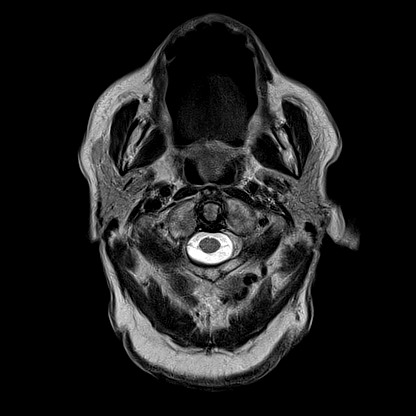
[im 27/27]
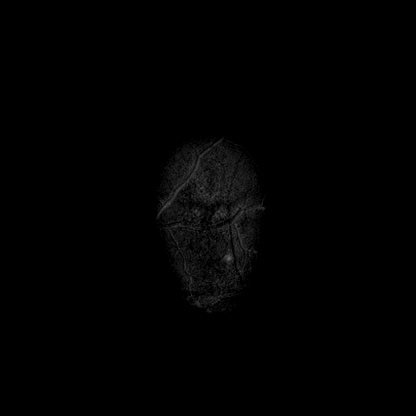

[Series 12: pha_images · axial · 3.0mm · 0.90mm/px · z∈[-52,+119]mm · 5 of 58 slices shown]
[im 1/58]
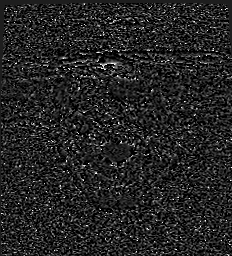
[im 15/58]
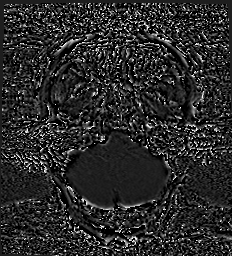
[im 29/58]
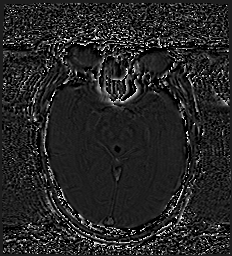
[im 43/58]
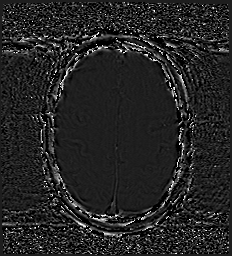
[im 58/58]
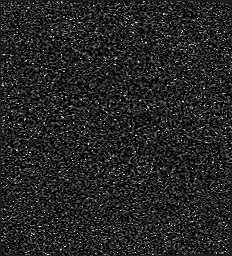

[Series 13: swi_images · axial · 3.0mm · 0.90mm/px · z∈[-52,+119]mm · 5 of 60 slices shown]
[im 1/60]
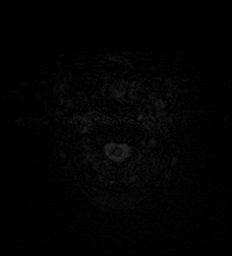
[im 15/60]
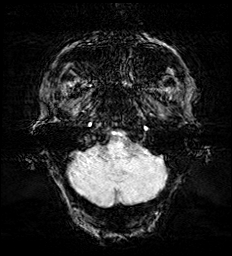
[im 30/60]
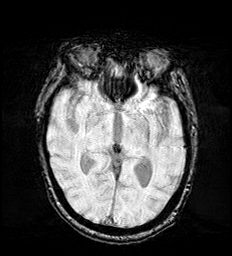
[im 45/60]
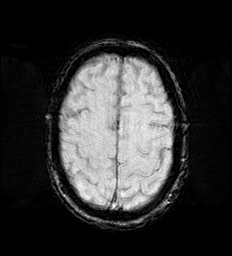
[im 60/60]
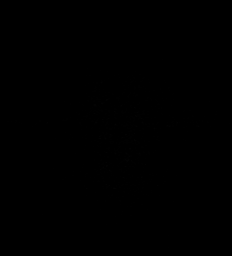

[Series 15: FLAIR · axial · 3.0mm · 0.53mm/px · z∈[-45,+112]mm · 4 of 55 slices shown]
[im 1/55]
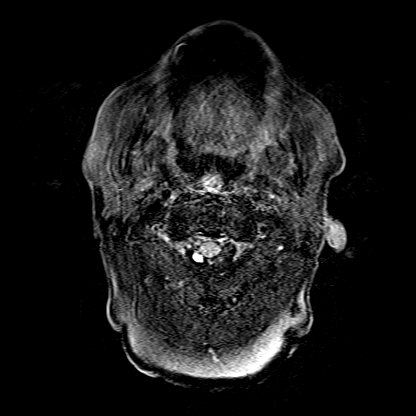
[im 19/55]
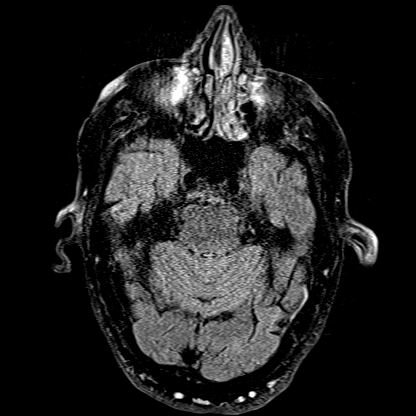
[im 37/55]
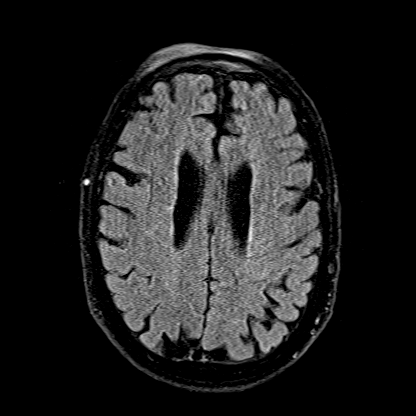
[im 55/55]
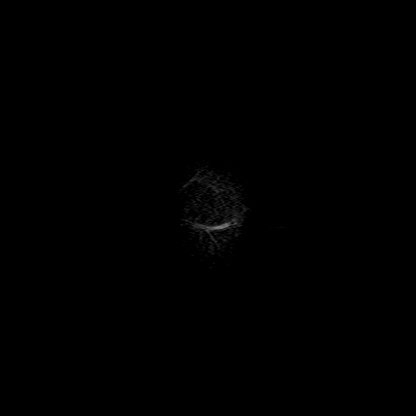

[Series 16: T1 · axial · 1.0mm · 0.98mm/px · z∈[-41,+127]mm · 12 of 176 slices shown (2 of 2)]
[im 1/176]
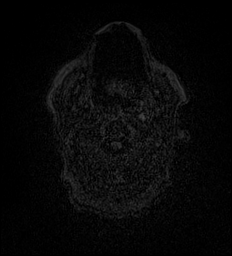
[im 14/176]
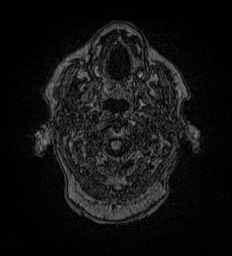
[im 27/176]
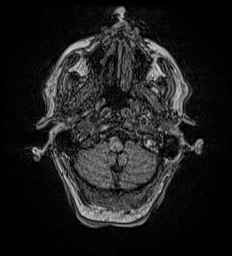
[im 41/176]
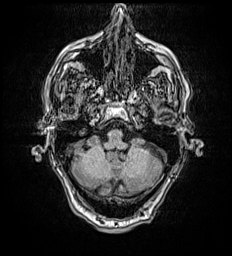
[im 54/176]
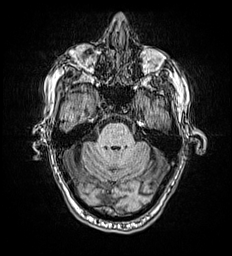
[im 68/176]
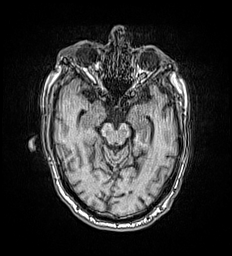
[im 81/176]
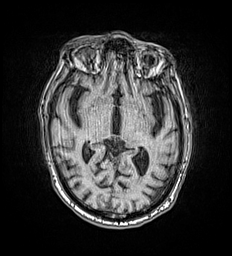
[im 95/176]
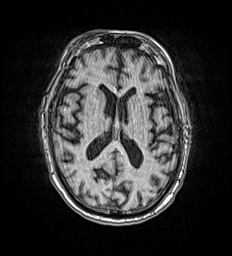
[im 108/176]
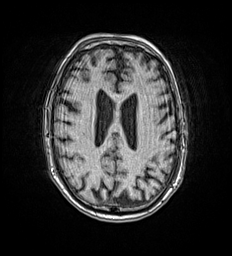
[im 122/176]
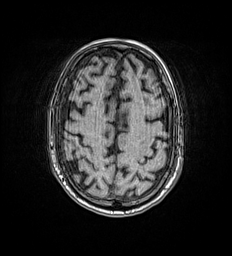
[im 149/176]
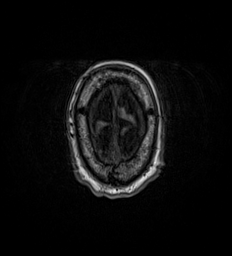
[im 176/176]
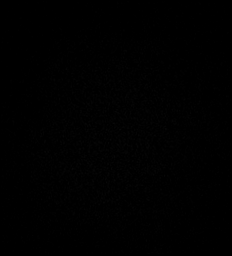

[Series 17: T2 · coronal · 5.0mm · 0.57mm/px · 2 of 29 slices shown (2 of 2)]
[im 1/29]
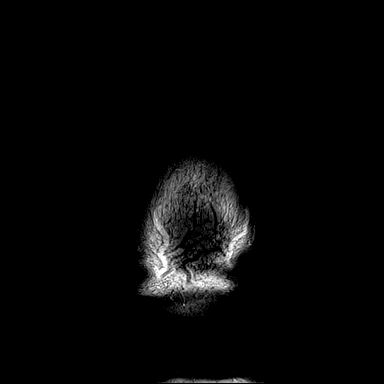
[im 29/29]
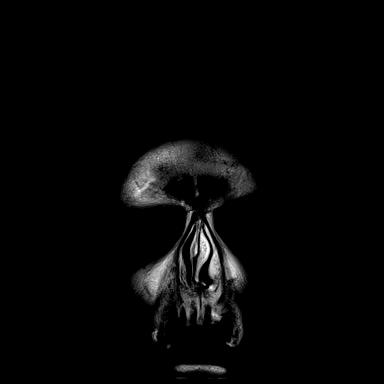

[46 of 48 positions shown; findings below may reference images not displayed]

FINDINGS: Brain: Cerebral volume within normal limits for age. Mild chronic
microvascular ischemic disease. No evidence for acute or subacute
infarct. Gray-white matter differentiation maintained. No areas of
chronic cortical infarction. No acute intracranial hemorrhage. Few
punctate chronic micro hemorrhages noted within the right cerebral
hemisphere and right cerebellum, likely hypertensive in nature.

No mass lesion, midline shift or mass effect. No hydrocephalus or
extra-axial fluid collection. Pituitary gland suprasellar region
within normal limits. Midline structures intact.

Vascular: Major intracranial vascular flow voids are maintained.

Skull and upper cervical spine: Craniocervical junction within
normal limits. Bone marrow signal intensity normal. Soft tissue
contusion present at the right forehead/supraorbital region.

Sinuses/Orbits: Prior bilateral ocular lens replacement. Globes and
orbital soft tissues demonstrate no other acute finding. Scattered
mucosal thickening noted within the ethmoidal air cells and
maxillary sinuses. Paranasal sinuses are otherwise clear. Trace
chronic left mastoid effusion noted, of doubtful significance. Inner
ear structures grossly normal.

Other: None.
IMPRESSION: 1. No acute intracranial abnormality.
2. Soft tissue contusion at the right forehead/supraorbital region.
3. Mild chronic microvascular ischemic disease.

## 2021-08-14 MED ORDER — ASPIRIN EC 81 MG PO TBEC
81.0000 mg | DELAYED_RELEASE_TABLET | Freq: Every day | ORAL | Status: DC
  Administered 2021-08-16: 81 mg via ORAL
  Filled 2021-08-14: qty 1

## 2021-08-14 MED ORDER — LACTATED RINGERS IV SOLN: Freq: Once | INTRAVENOUS | Status: DC

## 2021-08-14 MED ORDER — ACETAMINOPHEN 650 MG RE SUPP: 650.0000 mg | Freq: Four times a day (QID) | RECTAL | Status: DC | PRN

## 2021-08-14 MED ORDER — SODIUM CHLORIDE 0.9 % IV SOLN
2.0000 g | INTRAVENOUS | Status: DC
  Administered 2021-08-14: 2 g via INTRAVENOUS
  Filled 2021-08-14 (×2): qty 20

## 2021-08-14 MED ORDER — ACETAMINOPHEN 325 MG PO TABS
650.0000 mg | ORAL_TABLET | Freq: Four times a day (QID) | ORAL | Status: DC | PRN
  Administered 2021-08-14 – 2021-08-15 (×3): 650 mg via ORAL
  Filled 2021-08-14 (×3): qty 2

## 2021-08-14 MED ORDER — MIRABEGRON ER 50 MG PO TB24
50.0000 mg | ORAL_TABLET | Freq: Every day | ORAL | Status: DC
  Administered 2021-08-15 – 2021-08-16 (×2): 50 mg via ORAL
  Filled 2021-08-14 (×2): qty 1

## 2021-08-14 MED ORDER — LEVOTHYROXINE SODIUM 25 MCG PO TABS
125.0000 ug | ORAL_TABLET | Freq: Every day | ORAL | Status: DC
  Administered 2021-08-15 – 2021-08-16 (×2): 125 ug via ORAL
  Filled 2021-08-14 (×2): qty 1

## 2021-08-14 MED ORDER — FINASTERIDE 5 MG PO TABS
5.0000 mg | ORAL_TABLET | Freq: Every day | ORAL | Status: DC
Start: 2021-08-15 — End: 2021-08-16
  Administered 2021-08-15 – 2021-08-16 (×2): 5 mg via ORAL
  Filled 2021-08-14 (×2): qty 1

## 2021-08-14 MED ORDER — DOCUSATE SODIUM 100 MG PO CAPS
100.0000 mg | ORAL_CAPSULE | Freq: Two times a day (BID) | ORAL | Status: DC
  Administered 2021-08-14 – 2021-08-16 (×4): 100 mg via ORAL
  Filled 2021-08-14 (×4): qty 1

## 2021-08-14 MED ORDER — POLYETHYLENE GLYCOL 3350 17 G PO PACK: 17.0000 g | PACK | Freq: Every day | ORAL | Status: DC | PRN

## 2021-08-14 MED ORDER — SIMVASTATIN 20 MG PO TABS
20.0000 mg | ORAL_TABLET | Freq: Every day | ORAL | Status: DC
Start: 2021-08-14 — End: 2021-08-16
  Administered 2021-08-14 – 2021-08-15 (×2): 20 mg via ORAL
  Filled 2021-08-14 (×2): qty 1

## 2021-08-14 MED ORDER — SODIUM CHLORIDE 0.9 % IV SOLN
500.0000 mg | INTRAVENOUS | Status: DC
  Administered 2021-08-14: 500 mg via INTRAVENOUS
  Filled 2021-08-14 (×2): qty 5

## 2021-08-14 MED ORDER — SODIUM CHLORIDE 0.9% FLUSH
3.0000 mL | Freq: Two times a day (BID) | INTRAVENOUS | Status: DC
  Administered 2021-08-14 – 2021-08-16 (×4): 3 mL via INTRAVENOUS

## 2021-08-14 MED ORDER — SODIUM CHLORIDE 0.9 % IV BOLUS
1000.0000 mL | Freq: Once | INTRAVENOUS | Status: AC
  Administered 2021-08-14: 1000 mL via INTRAVENOUS

## 2021-08-14 MED ORDER — HEPARIN SODIUM (PORCINE) 5000 UNIT/ML IJ SOLN
5000.0000 [IU] | Freq: Three times a day (TID) | INTRAMUSCULAR | Status: DC
  Administered 2021-08-14 – 2021-08-15 (×2): 5000 [IU] via SUBCUTANEOUS
  Filled 2021-08-14 (×2): qty 1

## 2021-08-14 MED ORDER — LACTATED RINGERS IV SOLN: INTRAVENOUS | Status: DC

## 2021-08-14 MED ORDER — NAPHAZOLINE-GLYCERIN 0.012-0.25 % OP SOLN: 1.0000 [drp] | Freq: Four times a day (QID) | OPHTHALMIC | Status: DC | PRN

## 2021-08-14 MED ORDER — IOHEXOL 300 MG/ML  SOLN
75.0000 mL | Freq: Once | INTRAMUSCULAR | Status: AC | PRN
  Administered 2021-08-14: 75 mL via INTRAVENOUS

## 2021-08-14 MED ORDER — FLUTICASONE PROPIONATE 50 MCG/ACT NA SUSP: 1.0000 | Freq: Every day | NASAL | Status: DC | PRN

## 2021-08-14 MED ORDER — FERROUS SULFATE 325 (65 FE) MG PO TABS
325.0000 mg | ORAL_TABLET | Freq: Every day | ORAL | Status: DC
  Administered 2021-08-15 – 2021-08-16 (×2): 325 mg via ORAL
  Filled 2021-08-14 (×2): qty 1

## 2021-08-14 NOTE — Assessment & Plan Note (Addendum)
Rectal bleeding.  We will obtain H&H and a repeat CBC in the morning. ?A.m. team to discuss with family and patient about evaluation GI consult per a.m. team. ?

## 2021-08-14 NOTE — ED Notes (Signed)
Pt stood at bedside to use urinal, pt reported SOB, HR increased to 110, and O2 decreased to 87. Pt placed on 2L [place] at this time, O2 improved to 95%. MD Fuller Plan notified ?

## 2021-08-14 NOTE — Progress Notes (Signed)
CODE SEPSIS - PHARMACY COMMUNICATION ? ?**Broad Spectrum Antibiotics should be administered within 1 hour of Sepsis diagnosis** ? ?Time Code Sepsis Called/Page Received: 17:50 ? ?Antibiotics Ordered: Ceftriaxone and Azithromycin ? ?Time of 1st antibiotic administration: Ceftriaxone given at 17:57 ? ?Additional action taken by pharmacy:  ? ? ?If necessary, Name of Provider/Nurse Contacted:  ? ? ?Vira Blanco ,PharmD ?Clinical Pharmacist  ?08/14/2021  6:02 PM ? ?

## 2021-08-14 NOTE — ED Triage Notes (Signed)
Pt comes into the ED via [facility] clinic c/o fall today before his appt.  Pt is unsure if he tripped and fell or if he had a syncopal episode.  Pt was originally going to an appt with [facility] because of abd pain and new rectal bleeding.  Pt did admit to his son that he was dizzy when after the fall.  Pt has been getting new dosages for his thyroid medication, and per the family, he has declined since they have started tweaking his medications.  Pt denies any SHOB and currently has even and unlabored respirations.  ?

## 2021-08-14 NOTE — Assessment & Plan Note (Addendum)
Syncope and collapse presentation concerning for stroke. ?We will obtain an MRI of the brain. ?Orthostatic vitals.  Monitor patient on telemetry. ?Comprehensive blood work including troponins. ?EKG today sinus rhythm with no ST-T wave changes and normal intervals. ?

## 2021-08-14 NOTE — Sepsis Progress Note (Signed)
ELink tracking the code sepsis.  

## 2021-08-14 NOTE — Assessment & Plan Note (Addendum)
Blood pressure 131/65, pulse 72, temperature 97.8 ?F (36.6 ?C), resp. rate 20, height 5\' 6"  (1.676 m), weight 66.2 kg, SpO2 94 %. ?We will currently continue amlodipine and hold patient's lisinopril due to his acute kidney injury. ?Patient given 1 L of LR at 100 and, LR 50 overnight x10 more hours.  ? ?

## 2021-08-14 NOTE — Assessment & Plan Note (Addendum)
Continue levothyroxine at 125 mcg.  ?FT4 is borderline high at 2.00. ?

## 2021-08-14 NOTE — Assessment & Plan Note (Addendum)
Lab Results  ?Component Value Date  ? CREATININE 1.62 (H) 08/14/2021  ? CREATININE 1.09 06/24/2017  ? CREATININE 1.19 06/23/2017  ?Attribute acute kidney injury to poor p.o. intake and dehydration.  We will continue with gentle maintenance IV fluid hydration overnight. ? ?

## 2021-08-14 NOTE — Assessment & Plan Note (Addendum)
Patient found to be hypoxic. ?Blood pressure 131/65, pulse 72, temperature 97.8 ?F (36.6 ?C), resp. rate 20, height 5\' 6"  (1.676 m), weight 66.2 kg, SpO2 94 %. ?SpO2: 94 % ?O2 Flow Rate (L/min): 2 L/min ?He was started on oxygen. ?And CT imaging found pneumonia. ? ? ?

## 2021-08-14 NOTE — Assessment & Plan Note (Addendum)
We will continue patient on aspirin 81 along with heparin for DVT prophylaxis, along with statin therapy. ?Patient does not reporting any chest discomfort although he has dementia. ?

## 2021-08-14 NOTE — ED Provider Notes (Signed)
? ?[facility] ?Provider Note ? ? ? Event Date/Time  ? First MD Initiated Contact with Patient 08/14/21 1502   ?  (approximate) ? ? ?History  ? ?Fall, Abdominal Pain, and Rectal Bleeding ? ? ?HPI ? ?Richard Castaneda is a 88 y.o. male  with thyroid issues, pre diabetes, HTN who comes in for fall.  Patient presents with his son.  Patient reportedly has had some off-and-on abdominal pain nausea, vomiting and difficulties having bowel movements for the past 6 weeks.  Yesterday they went to the [facility] and was told to go to the ER but left AGAINST MEDICAL ADVICE and got a enema at home and had good bowel movement output.  Since then his pain seems to be much better.  However today he woke up on the ground and he is not sure if he tripped or he syncopized.  Patient did hit his head.  He denies any chest pain, shortness of breath.  Son also reports that he seems to just be a little bit more delayed in answering some questions over the past few months but does not have a diagnosis of dementia or anything like that. ? ?  ?Reviewed office visit for yesterday by [facility] clinic. Recommend going to ER to rule out Bowel obstruction but signed AMA to go home.  ? ?Physical Exam  ? ?Triage Vital Signs: ?ED Triage Vitals [08/14/21 1446]  ?Enc Vitals Group  ?   BP   ?   Pulse   ?   Resp   ?   Temp   ?   Temp src   ?   SpO2   ?   Weight 145 lb 15.1 oz (66.2 kg)  ?   Height 5\' 6"  (1.676 m)  ?   Head Circumference   ?   Peak Flow   ?   Pain Score 2  ?   Pain Loc   ?   Pain Edu?   ?   Excl. in GC?   ? ? ?Most recent vital signs: ?Vitals:  ? 08/14/21 1800 08/14/21 1815  ?BP: (!) 128/51   ?Pulse: 84 86  ?Resp: 16 17  ?Temp:    ?SpO2: 92% 94%  ? ? ? ?General: Awake, no distress.  ?CV:  Good peripheral perfusion.  ?Resp:  Normal effort.  ?Abd:  No distention.  ?Other:  Patient is alert and oriented x3.  He is got abrasion above the right eye without any significant tenderness around the eye.  Pupils are reactive  extraocular movements are intact.  He is got no tenderness on his chest wall, abdomen or his extremities.  He is able to lift both legs up off the bed and move both arms spontaneously without any issues.  Cranial nerves grossly appear intact. ? ? ?ED Results / Procedures / Treatments  ? ?Labs ?(all labs ordered are listed, but only abnormal results are displayed) ?Labs Reviewed  ?BASIC METABOLIC PANEL  ?CBC  ?URINALYSIS, ROUTINE W REFLEX MICROSCOPIC  ?HEPATIC FUNCTION PANEL  ?PROTIME-INR  ?APTT  ?TSH  ?T4, FREE  ?POC OCCULT BLOOD, ED  ?TYPE AND SCREEN  ?TROPONIN I (HIGH SENSITIVITY)  ? ? ? ?EKG ? ?My interpretation of EKG: ? ?Normal sinus rate of 70 without any ST elevation or T wave inversions except for aVL with grossly normal intervals. ? ?RADIOLOGY ?I have reviewed the CT and pt had LL PnA ? ? ?PROCEDURES: ? ?Critical Care performed: Yes, see critical care procedure note(s) ? ?.1-3  Lead EKG Interpretation ?Performed by: Concha Se, MD ?Authorized by: Concha Se, MD  ? ?  Interpretation: normal   ?  ECG rate:  80 ?  ECG rate assessment: normal   ?  Rhythm: sinus rhythm   ?  Ectopy: none   ?  Conduction: normal   ?.Critical Care ?Performed by: Concha Se, MD ?Authorized by: Concha Se, MD  ? ?Critical care provider statement:  ?  Critical care time (minutes):  30 ?  Critical care was necessary to treat or prevent imminent or life-threatening deterioration of the following conditions:  Sepsis and respiratory failure ?  Critical care was time spent personally by me on the following activities:  Development of treatment plan with patient or surrogate, discussions with consultants, evaluation of patient's response to treatment, examination of patient, ordering and review of laboratory studies, ordering and review of radiographic studies, ordering and performing treatments and interventions, pulse oximetry, re-evaluation of patient's condition and review of old charts ? ? ?MEDICATIONS ORDERED IN  ED: ?Medications  ?cefTRIAXone (ROCEPHIN) 2 g in sodium chloride 0.9 % 100 mL IVPB (2 g Intravenous New Bag/Given 08/14/21 1757)  ?azithromycin (ZITHROMAX) 500 mg in sodium chloride 0.9 % 250 mL IVPB (has no administration in time range)  ?iohexol (OMNIPAQUE) 300 MG/ML solution 75 mL (75 mLs Intravenous Contrast Given 08/14/21 1645)  ?sodium chloride 0.9 % bolus 1,000 mL (1,000 mLs Intravenous New Bag/Given 08/14/21 1759)  ? ? ? ?IMPRESSION / MDM / ASSESSMENT AND PLAN / ED COURSE  ?I reviewed the triage vital signs and the nursing notes. ?             ?               ? ?Patient comes in with concerns for fall possible syncope, abdominal pain.  Will get CT imaging and CTs of the head neck face abdomen to further evaluate for acute pathology.  Currently vital signs are stable ? ?Cardiac markers are negative seems less likely arrhythmia based upon EKG.  BMP shows elevated creatinine which is concerning for some dehydration.  CBC shows elevated white count concerning for infection.  CT scan showed left lower lobe pneumonia.  Patient tried to stand up and got tachycardic and desatted down to 87% so placed on 2 L.  I am concerned that patient had syncopal or fall secondary to sepsis, pneumonia.  Therefore sepsis alert called broad-spectrum antibiotics to cover pneumonia were added on and patient will be admitted to the hospital team for further work-up ? ? ? ?The patient is on the cardiac monitor to evaluate for evidence of arrhythmia and/or significant heart rate changes. ? ?FINAL CLINICAL IMPRESSION(S) / ED DIAGNOSES  ? ?Final diagnoses:  ?Syncope, unspecified syncope type  ?Injury of head, initial encounter  ?Sepsis, due to unspecified organism, unspecified whether acute organ dysfunction present [facility])  ?Community acquired pneumonia of left lower lobe of lung  ?Acute respiratory failure with hypoxia (HCC)  ? ? ? ?Rx / DC Orders  ? ?ED Discharge Orders   ? ? None  ? ?  ? ? ? ?Note:  This document was prepared using Dragon  voice recognition software and may include unintentional dictation errors. ?  ?Concha Se, MD ?08/14/21 1826 ? ?

## 2021-08-14 NOTE — H&P (Signed)
?History and Physical  ? ? ?Patient: Richard Castaneda [id number] DOB: 1929/08/21 ?DOA: 08/14/2021 ?DOS: the patient was seen and examined on 08/15/2021 ?PCP: Marisue Ivan, MD  ?Patient coming from: Home ? ?Chief Complaint:  ?Chief Complaint  ?Patient presents with  ? Fall  ? Abdominal Pain  ? Rectal Bleeding  ? ?HPI: Richard Castaneda is a 88 y.o. male with medical history significant of allergies to terazosin, hypothyroidism, heart disease, hypertension, iron deficiency anemia, presenting with fall.  HPI is limited and is per chart review patient has dementia and is not able to give history.  There is concern of syncope patient was originally going to his appointment at [facility] because of abdominal pain and rectal bleeding.  Per report patient told his son that he was dizzy.  Yesterday patient was asked to go to the emergency room but they left AGAINST MEDICAL ADVICE.  Today he woke up on the ground and he is not sure if he hit his head already was dizzy. ?Initial EKG shows sinus rhythm without any ST-T wave changes.  CT imaging showed that patient has left lower lobe pneumonia.  Upon arrival patient meets sepsis criteria, was also hypoxic. ? ?Review of Systems: Review of Systems  ?Unable to perform ROS: Dementia  ? ?Past Medical History:  ?Diagnosis Date  ? Acquired hypothyroidism 11/16/2016  ? Anterior myocardial infarction (HCC) 12/29/2013  ? Overview:  2004  ? Benign essential HTN 12/29/2013  ? Benign prostatic hyperplasia with urinary frequency 10/19/2016  ? Borderline diabetes mellitus 11/16/2016  ? Coronary artery disease 12/29/2013  ? Overview:  Anterior MI, PCI and stent placement of LAD11/04  ? H/O iron deficiency anemia 09/09/2015  ? History of BPH   ? Inguinal hernia, bilateral   ? Moderate tricuspid insufficiency 09/12/2014  ? Occasional tremors   ? Left hand only with writing and eating  ? Pre-diabetes   ? Primary osteoarthritis of left knee 08/26/2016  ? Pure hypercholesterolemia 12/29/2013  ?  PVD (peripheral vascular disease) (HCC) 12/29/2013  ? Overview:  With carotid atherosclerosis   ? ?Past Surgical History:  ?Procedure Laterality Date  ? CATARACT EXTRACTION W/ INTRAOCULAR LENS  IMPLANT, BILATERAL    ? CORONARY ANGIOPLASTY  2004  ? 1 stent  ? EYE SURGERY Bilateral   ? Cataract Extraction with IOL  ? HERNIA REPAIR Bilateral   ? Inguinal Hernia Repair  ? PARTIAL KNEE ARTHROPLASTY Left 12/31/2016  ? Procedure: UNICOMPARTMENTAL KNEE;  Surgeon: Christena Flake, MD;  Location: [facility] ORS;  Service: Orthopedics;  Laterality: Left;  ? TOTAL KNEE REVISION Left 06/22/2017  ? Procedure: TOTAL KNEE REVISION, CONVERTING A PARTIAL KNEE TO A TOTAL;  Surgeon: Christena Flake, MD;  Location: [facility] ORS;  Service: Orthopedics;  Laterality: Left;  ? ?Social History:  reports that he has never smoked. He has never used smokeless tobacco. He reports that he does not drink alcohol and does not use drugs. ? ?Allergies  ?Allergen Reactions  ? Terazosin   ?  Other reaction(s): Orthostatic hypotension  ? ? ?Family History  ?Problem Relation Age of Onset  ? Kidney failure Mother   ? Prostate cancer Paternal Uncle   ? Bladder Cancer Neg Hx   ? Kidney cancer Neg Hx   ? ? ?Prior to Admission medications   ?Medication Sig Start Date End Date Taking? Authorizing Provider  ?acetaminophen (TYLENOL) 650 MG CR tablet Take 650 mg by mouth 3 (three) times daily.    [provider]  ?amLODipine (NORVASC) 5 MG  tablet Take 5 mg by mouth daily before breakfast.  10/30/16   [provider]  ?amoxicillin (AMOXIL) 500 MG capsule TAKE 4 CAPSULES ONE HOUR PRIOR TO DENTAL APPOINTMENT. 01/18/18   [provider]  ?aspirin EC 81 MG tablet Take 81 mg by mouth daily.     [provider]  ?Cholecalciferol (VITAMIN D3) 400 units CAPS Take 400 Units by mouth daily with supper.    [provider]  ?ferrous sulfate 325 (65 FE) MG tablet Take 325 mg by mouth daily with breakfast.    [provider]  ?finasteride  (PROSCAR) 5 MG tablet TAKE 1 TABLET (5 MG TOTAL) BY MOUTH ONCE DAILY. 07/10/21   McGowan, Carollee Herter A, PA-C  ?fluticasone (FLONASE) 50 MCG/ACT nasal spray Place 1-2 sprays into both nostrils daily as needed. For allergies. 03/18/17   [provider]  ?levothyroxine (SYNTHROID, LEVOTHROID) 150 MCG tablet Take 150 mcg by mouth daily at 6 (six) AM. 05/21/17   [provider]  ?lisinopril (PRINIVIL,ZESTRIL) 40 MG tablet Take 40 mg by mouth daily before breakfast.  10/27/16   [provider]  ?mirabegron ER (MYRBETRIQ) 50 MG TB24 tablet Take 1 tablet (50 mg total) by mouth daily. 07/10/21   Harle Battiest, PA-C  ?Misc Natural Products (OSTEO BI-FLEX ADV TRIPLE ST) TABS Take 1 tablet by mouth 2 (two) times daily.    [provider]  ?Omega-3 Fatty Acids (FISH OIL) 1200 MG CAPS Take 1,200 mg by mouth daily with breakfast.     [provider]  ?simvastatin (ZOCOR) 20 MG tablet Take 20 mg by mouth daily at 8 pm. (2000)    [provider]  ?tetrahydrozoline 0.05 % ophthalmic solution Place 1 drop into both eyes daily. Visine Clear    [provider]  ? ? ?Physical Exam: ?Vitals:  ? 08/14/21 1953 08/14/21 1955 08/14/21 1958 08/15/21 0058  ?BP: 126/67 110/60 (!) 127/94 131/65  ?Pulse: 90 96 96 72  ?Resp: 16 16 16 20   ?Temp: 98.3 ?F (36.8 ?C)   97.8 ?F (36.6 ?C)  ?TempSrc: Oral     ?SpO2: 93% 95% 93% 94%  ?Weight:      ?Height:      ?Physical Exam ?Vitals and nursing note reviewed.  ?Constitutional:   ?   General: He is not in acute distress. ?   Appearance: Normal appearance. He is not ill-appearing, toxic-appearing or diaphoretic.  ?HENT:  ?   Head: Normocephalic and atraumatic.  ?   Right Ear: Hearing and external ear normal.  ?   Left Ear: Hearing and external ear normal.  ?   Nose: Nose normal. No nasal deformity.  ?   Mouth/Throat:  ?   Lips: Pink.  ?   Mouth: Mucous membranes are moist.  ?   Tongue: No lesions.  ?   Pharynx: Oropharynx is clear.  ?Eyes:  ?    Extraocular Movements: Extraocular movements intact.  ?   Pupils: Pupils are equal, round, and reactive to light.  ?Neck:  ?   Vascular: No carotid bruit.  ?Cardiovascular:  ?   Rate and Rhythm: Normal rate and regular rhythm.  ?   Pulses: Normal pulses.  ?   Heart sounds: Normal heart sounds.  ?Pulmonary:  ?   Effort: Pulmonary effort is normal.  ?   Breath sounds: Normal breath sounds.  ?Abdominal:  ?   General: Bowel sounds are normal. There is no distension.  ?   Palpations: Abdomen is soft. There is  no mass.  ?   Tenderness: There is no abdominal tenderness. There is no guarding.  ?   Hernia: No hernia is present.  ?Musculoskeletal:  ?   Right lower leg: No edema.  ?   Left lower leg: No edema.  ?Skin: ?   General: Skin is warm.  ?Neurological:  ?   General: No focal deficit present.  ?   Mental Status: He is alert and oriented to person, place, and time.  ?   Cranial Nerves: Cranial nerves 2-12 are intact.  ?   Motor: Motor function is intact.  ?Psychiatric:     ?   Attention and Perception: Attention normal.     ?   Mood and Affect: Mood normal.     ?   Speech: Speech normal.     ?   Behavior: Behavior normal. Behavior is cooperative.     ?   Cognition and Memory: Cognition normal.  ? ? ?Data Reviewed: ?Results for orders placed or performed during the hospital encounter of 08/14/21 (from the past 24 hour(s))  ?Urinalysis, Routine w reflex microscopic Urine, Clean Catch     Status: Abnormal  ? Collection Time: 08/14/21  2:48 PM  ?Result Value Ref Range  ? Color, Urine YELLOW (A) YELLOW  ? APPearance CLEAR (A) CLEAR  ? Specific Gravity, Urine 1.010 1.005 - 1.030  ? pH 5.0 5.0 - 8.0  ? Glucose, UA NEGATIVE NEGATIVE mg/dL  ? Hgb urine dipstick SMALL (A) NEGATIVE  ? Bilirubin Urine NEGATIVE NEGATIVE  ? Ketones, ur NEGATIVE NEGATIVE mg/dL  ? Protein, ur NEGATIVE NEGATIVE mg/dL  ? Nitrite NEGATIVE NEGATIVE  ? Leukocytes,Ua TRACE (A) NEGATIVE  ? RBC / HPF 0-5 0 - 5 RBC/hpf  ? WBC, UA 0-5 0 - 5 WBC/hpf  ? Bacteria,  UA NONE SEEN NONE SEEN  ? Squamous Epithelial / LPF 0-5 0 - 5  ? Mucus PRESENT   ? Hyaline Casts, UA PRESENT   ?D-dimer, quantitative     Status: Abnormal  ? Collection Time: 08/14/21  3:20 PM  ?Result Value

## 2021-08-14 NOTE — Assessment & Plan Note (Addendum)
?    Latest Ref Rng & Units 08/14/2021  ?  3:28 PM 06/24/2017  ?  3:00 AM 06/23/2017  ?  3:02 AM  ?CBC  ?WBC 4.0 - 10.5 K/uL 22.5   11.4   10.4    ?Hemoglobin 13.0 - 17.0 g/dL 82.9   93.7   16.9    ?Hematocrit 39.0 - 52.0 % 33.4   31.6   33.7    ?Platelets 150 - 400 K/uL 255   177   211    ?H&H is stable patient had concerns of rectal bleeding.  And due to the syncopal episode we will follow CBC in the morning. ?We will consider GI consult based on labs in the morning after discussing with family as far as goals of care and intervention and evaluation for patient's anemia. ?Type and screen, IV PPI. ?

## 2021-08-15 ENCOUNTER — Inpatient Hospital Stay
Admit: 2021-08-15 | Discharge: 2021-08-15 | Disposition: A | Discharge status: Home / Self Care | Payer: Medicare Other | Attending: Internal Medicine | Admitting: Internal Medicine

## 2021-08-15 DIAGNOSIS — A419 Sepsis, unspecified organism: Secondary | ICD-10-CM | POA: Diagnosis present

## 2021-08-15 DIAGNOSIS — J189 Pneumonia, unspecified organism: Secondary | ICD-10-CM

## 2021-08-15 DIAGNOSIS — R55 Syncope and collapse: Secondary | ICD-10-CM | POA: Diagnosis not present

## 2021-08-15 LAB — CBC
HCT: 29.9 % — ABNORMAL LOW (ref 39.0–52.0)
Hemoglobin: 10.1 g/dL — ABNORMAL LOW (ref 13.0–17.0)
MCH: 31.4 pg (ref 26.0–34.0)
MCHC: 33.8 g/dL (ref 30.0–36.0)
MCV: 92.9 fL (ref 80.0–100.0)
Platelets: 233 10*3/uL (ref 150–400)
RBC: 3.22 MIL/uL — ABNORMAL LOW (ref 4.22–5.81)
RDW: 12.3 % (ref 11.5–15.5)
WBC: 12 10*3/uL — ABNORMAL HIGH (ref 4.0–10.5)
nRBC: 0 % (ref 0.0–0.2)

## 2021-08-15 LAB — COMPREHENSIVE METABOLIC PANEL
ALT: 32 U/L (ref 0–44)
AST: 22 U/L (ref 15–41)
Albumin: 2.9 g/dL — ABNORMAL LOW (ref 3.5–5.0)
Alkaline Phosphatase: 44 U/L (ref 38–126)
Anion gap: 5 (ref 5–15)
BUN: 35 mg/dL — ABNORMAL HIGH (ref 8–23)
CO2: 26 mmol/L (ref 22–32)
Calcium: 8.1 mg/dL — ABNORMAL LOW (ref 8.9–10.3)
Chloride: 103 mmol/L (ref 98–111)
Creatinine, Ser: 1.18 mg/dL (ref 0.61–1.24)
GFR, Estimated: 59 mL/min — ABNORMAL LOW (ref >60)
Glucose, Bld: 100 mg/dL — ABNORMAL HIGH (ref 70–99)
Potassium: 3.7 mmol/L (ref 3.5–5.1)
Sodium: 134 mmol/L — ABNORMAL LOW (ref 135–145)
Total Bilirubin: 0.7 mg/dL (ref 0.3–1.2)
Total Protein: 5.2 g/dL — ABNORMAL LOW (ref 6.5–8.1)

## 2021-08-15 LAB — ECHOCARDIOGRAM COMPLETE
AR max vel: 2.19 cm2
AV Area VTI: 2.47 cm2
AV Area mean vel: 2.13 cm2
AV Mean grad: 5.5 mmHg
AV Peak grad: 10 mmHg
Ao pk vel: 1.58 m/s
Area-P 1/2: 3.23 cm2
Height: 66 in
MV VTI: 2.67 cm2
S' Lateral: 3 cm
Weight: 2335.11 oz

## 2021-08-15 MED ORDER — POLYETHYLENE GLYCOL 3350 17 G PO PACK
17.0000 g | PACK | Freq: Every day | ORAL | Status: DC
  Administered 2021-08-15: 17 g via ORAL
  Filled 2021-08-15 (×2): qty 1

## 2021-08-15 MED ORDER — AZITHROMYCIN 500 MG PO TABS
500.0000 mg | ORAL_TABLET | Freq: Every day | ORAL | Status: AC
  Administered 2021-08-15: 500 mg via ORAL
  Filled 2021-08-15: qty 1

## 2021-08-15 MED ORDER — LACTATED RINGERS IV SOLN: INTRAVENOUS | Status: DC

## 2021-08-15 MED ORDER — AZITHROMYCIN 500 MG PO TABS
250.0000 mg | ORAL_TABLET | Freq: Every day | ORAL | Status: DC
  Administered 2021-08-16: 250 mg via ORAL
  Filled 2021-08-15: qty 1

## 2021-08-15 MED ORDER — SODIUM CHLORIDE 0.9 % IV SOLN
2.0000 g | INTRAVENOUS | Status: AC
  Administered 2021-08-15: 2 g via INTRAVENOUS
  Filled 2021-08-15: qty 2

## 2021-08-15 NOTE — Progress Notes (Signed)
*  PRELIMINARY RESULTS* ?Echocardiogram ?2D Echocardiogram has been performed. ? ?Neale Marzette, Dorene Sorrow ?08/15/2021, 11:42 AM ?

## 2021-08-15 NOTE — TOC Progression Note (Addendum)
Transition of Care (TOC) - Progression Note  ? ? ?Patient Details  ?Name: Richard Castaneda ?MRN: [id number] ?Date of Birth: October 03, 1929 ? ?Transition of Care (TOC) CM/SW Contact  ?Caryn Section, RN ?Phone Number: ?08/15/2021, 4:13 PM ? ?Clinical Narrative:   [facility] unable to take patient due to insurance.  Sent information to [place] at [facility], who can start services on Wednesday. ? ?Addendum 1618 patietn will get PT and RN for disease management. ? ?Expected Discharge Plan: Home w Home Health Services ?Barriers to Discharge: Continued Medical Work up ? ?Expected Discharge Plan and Services ?Expected Discharge Plan: Home w Home Health Services ?  ?Discharge Planning Services: CM Consult ?Post Acute Care Choice: Durable Medical Equipment, Home Health ?Living arrangements for the past 2 months: Single Family Home ?                ?DME Arranged: Walker rolling ?DME Agency: [employer] ?Date DME Agency Contacted: 08/15/21 ?  ?Representative spoke with at DME Agency: rhonda ?HH Arranged: PT ?  ?  ?  ?  ? ? ?Social Determinants of Health (SDOH) Interventions ?  ? ?Readmission Risk Interventions ? ?  08/15/2021  ?  3:18 PM  ?Readmission Risk Prevention Plan  ?Post Dischage Appt Complete  ?Medication Screening Complete  ?Transportation Screening Complete  ? ? ?

## 2021-08-15 NOTE — TOC Initial Note (Signed)
Transition of Care (TOC) - Initial/Assessment Note  ? ? ?Patient Details  ?Name: Richard Castaneda ?MRN: [id number] ?Date of Birth: February 28, 1930 ? ?Transition of Care (TOC) CM/SW Contact:    ?Pete Pelt, RN ?Phone Number: ?08/15/2021, 3:20 PM ? ?Clinical Narrative:   Patient lives at home alone but family is nearby and assists as needed.  No concerns with transportation to appointments or to the pharmacy and patient is able to take medications as directed. ? ?Home Health recommended, sarah at [facility] notified, she will get back to [facility] with determination if agency can accept patient. ? ?Patient does not have rolling walker at home, MD recommended rolling walker, ordered to deliver to room by [employer]. ? ?              ? ? ?Expected Discharge Plan: [place] ?Barriers to Discharge: Continued Medical Work up ? ? ?Patient Goals and CMS Choice ?  ?  ?Choice offered to / list presented to : NA ? ?Expected Discharge Plan and Services ?Expected Discharge Plan: [place] ?  ?Discharge Planning Services: CM Consult ?Post Acute Care Choice: Durable Medical Equipment, Home Health ?Living arrangements for the past 2 months: [place] ?                ?DME Arranged: Walker rolling ?DME Agency: [employer] ?Date DME Agency Contacted: 08/15/21 ?  ?Representative spoke with at DME Agency: rhonda ?HH Arranged: PT ?  ?  ?  ?  ? ?Prior Living Arrangements/Services ?Living arrangements for the past 2 months: [place] ?Lives with:: Self ?Patient language and need for interpreter reviewed:: Yes (spoke to daughter in law) ?Do you feel safe going back to the place where you live?: Yes      ?Need for Family Participation in Patient Care: Yes (Comment) ?Care giver support system in place?: Yes (comment) ?Current home services: DME ?Criminal Activity/Legal Involvement Pertinent to Current Situation/Hospitalization: No - Comment as needed ? ?Activities of Daily Living ?Home Assistive  Devices/Equipment: Gilford Rile (specify type) ?ADL Screening (condition at time of admission) ?Patient's cognitive ability adequate to safely complete daily activities?: Yes ?Is the patient deaf or have difficulty hearing?: No ?Does the patient have difficulty seeing, even when wearing glasses/contacts?: No ?Does the patient have difficulty concentrating, remembering, or making decisions?: No ?Patient able to express need for assistance with ADLs?: Yes ?Does the patient have difficulty dressing or bathing?: Yes ?Independently performs ADLs?: No ?Does the patient have difficulty walking or climbing stairs?: Yes ?Weakness of Legs: Both ?Weakness of Arms/Hands: Both ? ?Permission Sought/Granted ?Permission sought to share information with : Case Manager ?Permission granted to share information with : Yes, Verbal Permission Granted ?   ? Permission granted to share info w AGENCY: home health and dme ?   ?   ? ?Emotional Assessment ?Appearance:: Appears stated age ?  ?  ?Orientation: : Oriented to Self, Oriented to Place, Oriented to  Time ?Alcohol / Substance Use: Not Applicable ?Psych Involvement: No (comment) ? ?Admission diagnosis:  Syncope and collapse [R55] ?Acute respiratory failure with hypoxia ([place]) [J96.01] ?Injury of head, initial encounter [S09.90XA] ?Syncope, unspecified syncope type [R55] ?Community acquired pneumonia of left lower lobe of lung [J18.9] ?Sepsis, due to unspecified organism, unspecified whether acute organ dysfunction present ([place]) [A41.9] ?Patient Active Problem List  ? Diagnosis Date Noted  ? Sepsis ([place]) 08/15/2021  ? CAP (community acquired pneumonia) 08/15/2021  ? Anemia 08/14/2021  ? Rectal bleeding 08/14/2021  ?  Syncope and collapse 08/14/2021  ? AKI (acute kidney injury) ([place]) 08/14/2021  ? Acute respiratory failure with hypoxia ([place]) 08/14/2021  ? Dyspepsia and other specified disorders of function of stomach 07/09/2020  ? Hemorrhoids 07/09/2020  ? Inguinal hernia 07/09/2020  ? Other and  unspecified hyperlipidemia 07/09/2020  ? Lumbar spondylosis 10/03/2018  ? CKD (chronic kidney disease) stage 3, GFR 30-59 ml/min (HCC) 09/01/2018  ? Bilateral carotid artery stenosis 05/27/2018  ? Status post revision of total knee replacement, left 06/22/2017  ? Status post unicompartmental knee replacement, left 12/31/2016  ? Borderline diabetes mellitus 11/16/2016  ? Hypothyroidism 11/16/2016  ? Benign prostatic hyperplasia with urinary frequency 10/19/2016  ? Primary osteoarthritis of left knee 08/26/2016  ? Vaccine counseling 03/09/2016  ? Medicare annual wellness visit, subsequent 03/09/2016  ? Medicare annual wellness visit, initial 03/09/2016  ? Moderate tricuspid insufficiency 09/12/2014  ? Moderate mitral insufficiency 09/12/2014  ? PVD (peripheral vascular disease) ([place]) 12/29/2013  ? Pure hypercholesterolemia 12/29/2013  ? Coronary artery disease 12/29/2013  ? Anterior myocardial infarction ([place]) 12/29/2013  ? Essential hypertension 12/29/2013  ? ?PCP:  Dion Body, MD ?Pharmacy:   ?[facility], [place] - [address] ?[address] ?[place] [address] ?Phone: [phone] Fax: [phone] ? ? ? ? ?Social Determinants of Health (SDOH) Interventions ?  ? ?Readmission Risk Interventions ? ?  08/15/2021  ?  3:18 PM  ?Readmission Risk Prevention Plan  ?Post Dischage Appt Complete  ?Medication Screening Complete  ?Transportation Screening Complete  ? ? ? ?

## 2021-08-15 NOTE — Evaluation (Addendum)
Physical Therapy Evaluation ?Patient Details ?Name: Richard Castaneda ?MRN: [id number] ?DOB: March 06, 1930 ?Today's Date: 08/15/2021 ? ?History of Present Illness ? Patient is an 88 year old male with a PMH (+) for significant of allergies to terazosin, hypothyroidism, heart disease, hypertension, iron deficiency anemia who presents to [facility] following a fall. ?  ?Clinical Impression ? Physical Therapy Evaluation completed this date. Patient tolerated session well and was agreeable to treatment. Upon arrival into room patient was in bed with [facility] elevated and family present. No pain reported. Patient lives in a 1 story home by himself, with his children coming 1-2x/week to check in on him. Patient does not ambulate with an AD at baseline, however does have a RW and a SPC in the home. Patient reported being Mod I/ independent with all Mobility and ADLs prior to hospitalization.  ? ?Patient demonstrated [facility] BUE strength, and at least 4/5 strength in BLEs. All bed mobility and sit to stand transfers from EOB and toilet were completed at supervision. Mild use of bed rails was completed for bed mobility, and RW/ toilet grab bar were utilized for functional transfers. Pericare completed Mod I. Patient ambulated around the nurses station twice. Once with the RW, and once without on 2L O2 via [place]. Without RW, patient demonstrated decreased step length bilaterally, and no LOB concerns at SBA/SUP. Without RW patient demonstrated a shuffle like gait pattern with no LOB noted at SBA/SUP. Increased stability noted with RW during ambulation.  Patient was left in bed with all needs met and family present. Patient would benefit from continued skilled physical therapy in order to optimize patient's return to PLOF. Recommend HHPT upon discharge from acute hospitalization.  ?   ? ?Recommendations for follow up therapy are one component of a multi-disciplinary discharge planning process, led by the attending physician.  Recommendations may be  updated based on patient status, additional functional criteria and insurance authorization. ? ?Follow Up Recommendations Home health PT ? ?  ?Assistance Recommended at Discharge Intermittent Supervision/Assistance  ?Patient can return home with the following ? Help with stairs or ramp for entrance ? ?  ?Equipment Recommendations Rolling walker (2 wheels)   ?Recommendations for Other Services ?    ?  ?Functional Status Assessment Patient has had a recent decline in their functional status and demonstrates the ability to make significant improvements in function in a reasonable and predictable amount of time.  ? ?  ?Precautions / Restrictions Precautions ?Precautions: Fall ?Restrictions ?Weight Bearing Restrictions: No  ? ?  ? ?Mobility ? Bed Mobility ?Overal bed mobility: Needs Assistance ?Bed Mobility: Supine to Sit, Sit to Supine ?  ?  ?Supine to sit: Supervision ?Sit to supine: Supervision ?  ?General bed mobility comments: no physical assistance, however use of bed rail required ?Patient Response: Cooperative ? ?Transfers ?Overall transfer level: Needs assistance ?Equipment used: Rolling walker (2 wheels) ?Transfers: Sit to/from Stand ?Sit to Stand: Supervision ?  ?  ?  ?  ?  ?  ?  ? ?Ambulation/Gait ?Ambulation/Gait assistance: Supervision (SBA) ?Gait Distance (Feet): 320 Feet ?Assistive device: Rolling walker (2 wheels), None ?Gait Pattern/deviations: Shuffle, Step-through pattern, Decreased step length - right, Decreased step length - left, Decreased stride length, Narrow base of support ?Gait velocity: Decreased ?  ?  ?General Gait Details: Patient ambulated 1 lap without RW, demonstrating a shuffle like gait pattern. No LOB noted, however mild mild unsteadiness. With RW gait pattern improved minimally, however continued to demonstrate decreased step length bilaterally. ? ?Stairs ?  ?  ?  ?  ?  ? ?  Wheelchair Mobility ?  ? ?Modified Rankin (Stroke Patients Only) ?  ? ?  ? ?Balance Overall balance assessment:  Needs assistance ?Sitting-balance support: Feet supported ?Sitting balance-Leahy Scale: Good ?Sitting balance - Comments: able to reach outside BOS and help change soiled gown ?  ?Standing balance support: Bilateral upper extremity supported, No upper extremity supported, During functional activity ?Standing balance-Leahy Scale: Fair ?Standing balance comment: Can ambulate with and W/O RW safely, however improved gait mechanics with RW ?  ?  ?  ?  ?  ?  ?  ?  ?  ?  ?  ?   ? ? ? ?Pertinent Vitals/Pain Pain Assessment ?Pain Assessment: 0-10 ?Pain Score: 0-No pain ?Pain Intervention(s): Monitored during session  ? ? ?Home Living Family/patient expects to be discharged to:: Private residence ?Living Arrangements: Alone (Son stays once a week, daughter comes and stays one night a week) ?Available Help at Discharge: Family ?Type of Home: House ?Home Access: Stairs to enter ?Entrance Stairs-Rails: None ?Entrance Stairs-Number of Steps: 1 ?  ?Home Layout: One level ?Home Equipment: Other (comment);Grab bars - tub/shower;Cane - single point (slip mat inthe shower) ?   ?  ?Prior Function Prior Level of Function : Independent/Modified Independent ?  ?  ?  ?  ?  ?  ?Mobility Comments: No AD at basline ?ADLs Comments: Family assist with chores 1-2x/wk, no assistance with showering, dressing ?  ? ? ?Hand Dominance  ?   ? ?  ?Extremity/Trunk Assessment  ? Upper Extremity Assessment ?Upper Extremity Assessment: Overall WFL for tasks assessed ?  ? ?Lower Extremity Assessment ?Lower Extremity Assessment: Generalized weakness (at least 4-/5 strength) ?  ? ?   ?Communication  ? Communication: No difficulties  ?Cognition Arousal/Alertness: Awake/alert ?Behavior During Therapy: [facility] for tasks assessed/performed ?Overall Cognitive Status: Within Functional Limits for tasks assessed ?  ?  ?  ?  ?  ?  ?  ?  ?  ?  ?  ?  ?  ?  ?  ?  ?General Comments: A&Ox3- self location situation ?  ?  ? ?  ?General Comments General comments (skin integrity,  edema, etc.): SpO2 remained >90% on 2L O2, HR ranged from 74-81bpm ? ?  ?Exercises Other Exercises ?Other Exercises: Patient educated on role of PT in acute care setting, fall risk, d/c recommendations  ? ?Assessment/Plan  ?  ?PT Assessment Patient needs continued PT services  ?PT Problem List Decreased strength;Decreased balance ? ?   ?  ?PT Treatment Interventions DME instruction;Gait training;Balance training;Therapeutic activities;Therapeutic exercise;Stair training   ? ?PT Goals (Current goals can be found in the Care Plan section)  ?Acute Rehab PT Goals ?Patient Stated Goal: to go home ?PT Goal Formulation: With patient ?Time For Goal Achievement: 08/29/21 ?Potential to Achieve Goals: Good ? ?  ?Frequency Min 2X/week ?  ? ? ?Co-evaluation   ?  ?  ?  ?  ? ? ?  ?AM-PAC PT "6 Clicks" Mobility  ?Outcome Measure Help needed turning from your back to your side while in a flat bed without using bedrails?: A Little ?Help needed moving from lying on your back to sitting on the side of a flat bed without using bedrails?: A Little ?Help needed moving to and from a bed to a chair (including a wheelchair)?: A Little ?Help needed standing up from a chair using your arms (e.g., wheelchair or bedside chair)?: A Little ?Help needed to walk in hospital room?: A Little ?Help needed climbing 3-5 steps with  a railing? : A Little ?6 Click Score: 18 ? ?  ?End of Session Equipment Utilized During Treatment: Gait belt ?Activity Tolerance: Patient tolerated treatment well ?Patient left: in bed;with call bell/phone within reach;with family/visitor present ?Nurse Communication: Mobility status ?PT Visit Diagnosis: Unsteadiness on feet (R26.81);Muscle weakness (generalized) (M62.81);Difficulty in walking, not elsewhere classified (R26.2) ?  ? ?Time: 7867-5449 ?PT Time Calculation (min) (ACUTE ONLY): 42 min ? ? ?Charges:   PT Evaluation ?$PT Eval Low Complexity: 1 Low ?PT Treatments ?$Gait Training: 23-37 mins ?  ?   ? ? ?Iva Boop,  PT  ?08/15/21. 11:59 AM ? ? ?

## 2021-08-15 NOTE — Assessment & Plan Note (Addendum)
We will continue patient on azithromycin and Rocephin. ?Supplemental oxygen as needed. ?Antitussives as needed. ?Pneumovax upon discharge or per primary care. ?

## 2021-08-15 NOTE — Assessment & Plan Note (Signed)
Patient with sepsis criteria with tachycardia respiratory rate of 20 and a white count of 22. ?We will continue patient on community-acquired pneumonia therapy with azithromycin and Rocephin. ?Supportive care and supplemental oxygen as needed. ? ? ?

## 2021-08-15 NOTE — Progress Notes (Signed)
[place] at [facility] ? ? ?PATIENT NAME: Richard Castaneda   ? ?MR#:  [id number] ? ?DATE OF BIRTH:  1929-08-11 ? ?SUBJECTIVE:  ? ?son at bedside. Patient came in after all at home. Patient does not remember the details. Has been having issues with constipation according to the son. Gets nauseous when he does not have a bowel movement. Has been complaining of weakness last few weeks. His thyroid medication has been changed due to abnormal blood levels. Denies any shortness of breath or cough denies any fever.Had BM and feels better ? ? ?VITALS:  ?Blood pressure (!) 142/55, pulse 69, temperature 98.5 ?F (36.9 ?C), resp. rate 18, height 5\' 6"  (1.676 m), weight 66.2 kg, SpO2 95 %. ? ?PHYSICAL EXAMINATION:  ? ?GENERAL:  88 y.o.-year-old patient lying in the bed with no acute distress. Right forehead bruise ?LUNGS: Normal breath sounds bilaterally, no wheezing, rales, rhonchi.  ?CARDIOVASCULAR: S1, S2 normal. No murmurs, rubs, or gallops.  ?ABDOMEN: Soft, nontender, nondistended. Bowel sounds present.  ?EXTREMITIES: No  edema b/l.    ?NEUROLOGIC: nonfocal  patient is alert and awake ?SKIN: No obvious rash, lesion, or ulcer.  ? ?LABORATORY PANEL:  ?CBC ?Recent Labs  ?Lab 08/15/21 ?[id number]  ?WBC 12.0*  ?HGB 10.1*  ?HCT 29.9*  ?PLT 233  ? ? ?Chemistries  ?Recent Labs  ?Lab 08/15/21 ?[id number]  ?NA 134*  ?K 3.7  ?CL 103  ?CO2 26  ?GLUCOSE 100*  ?BUN 35*  ?CREATININE 1.18  ?CALCIUM 8.1*  ?AST 22  ?ALT 32  ?ALKPHOS 44  ?BILITOT 0.7  ? ?Cardiac Enzymes ?No results for input(s): TROPONINI in the last 168 hours. ?RADIOLOGY:  ?DG Chest 2 View ? ?Result Date: 08/14/2021 ?CLINICAL DATA:  Shortness of breath and fall. EXAM: CHEST - 2 VIEW COMPARISON:  Chest radiograph 06/14/2017 FINDINGS: The cardiomediastinal contours are normal. Atherosclerosis of the thoracic aorta. There are ill-defined left perihilar opacities. Pulmonary vasculature is normal. No pneumothorax. No pleural effusion. Chronic wedge deformity of the  midthoracic spine. No acute osseous abnormalities are seen. IMPRESSION: Ill-defined left perihilar opacities, atelectasis versus pneumonia. Pulmonary contusion is also considered in the setting of fall. Electronically Signed   By: Keith Rake M.D.   On: 08/14/2021 16:52  ? ?CT HEAD WO CONTRAST (5MM) ? ?Result Date: 08/14/2021 ?CLINICAL DATA:  [place] today, uncertain if he tripped or had a syncopal episode, dizzy after fall. History hypertension, coronary artery disease post MI, stage III chronic kidney disease EXAM: CT HEAD WITHOUT CONTRAST CT MAXILLOFACIAL WITHOUT CONTRAST CT CERVICAL SPINE WITHOUT CONTRAST TECHNIQUE: Multidetector CT imaging of the head, cervical spine, and maxillofacial structures were performed using the standard protocol without intravenous contrast. Multiplanar CT image reconstructions of the cervical spine and maxillofacial structures were also generated. RADIATION DOSE REDUCTION: This exam was performed according to the departmental dose-optimization program which includes automated exposure control, adjustment of the mA and/or kV according to patient size and/or use of iterative reconstruction technique. COMPARISON:  None FINDINGS: CT HEAD FINDINGS Brain: Generalized atrophy. Normal ventricular morphology. No midline shift or mass effect. Otherwise normal appearance of brain parenchyma. No intracranial hemorrhage, mass lesion, or evidence of acute infarction. No extra-axial fluid collections. Vascular: Atherosclerotic calcification of internal carotid and LEFT vertebral arteries at skull base Skull: Demineralized but intact Other: N/A CT MAXILLOFACIAL FINDINGS Osseous: Scattered beam hardening artifacts of dental origin. Osseous demineralization. TMJ alignment normal bilaterally. Partial slight nasal septal deviation to the RIGHT. No facial bone fractures. Denture plates. Orbits: Bony  orbits intact.  No intraorbital fluid or pneumatosis. Sinuses: Paranasal sinuses, mastoid air cells, and  middle ear cavities clear. Soft tissues: RIGHT periorbital soft tissue swelling extending supraorbital. CT CERVICAL SPINE FINDINGS Alignment: Normal Skull base and vertebrae: Osseous demineralization. Vertebral body heights maintained. Scattered disc space narrowing with tiny anterior endplate spurs at D34-534. Multilevel facet degenerative changes. Visualized skull base intact. No fracture, subluxation, or bone destruction. Soft tissues and spinal canal: Prevertebral soft tissues normal thickness. Atherosclerotic calcifications at carotid bifurcations and LEFT vertebral artery as well as cranial aspect of aortic arch Disc levels:  No specific abnormalities Upper chest: LEFT upper lobe infiltrate Other: N/A IMPRESSION: Generalized atrophy. No acute intracranial abnormalities. RIGHT periorbital soft tissue swelling without acute facial bone abnormalities. Degenerative disc and facet disease changes of the cervical spine. No acute cervical spine abnormalities. LEFT upper lobe infiltrate. Aortic Atherosclerosis (ICD10-I70.0). Electronically Signed   By: Lavonia Dana M.D.   On: 08/14/2021 17:13  ? ?CT Cervical Spine Wo Contrast ? ?Result Date: 08/14/2021 ?CLINICAL DATA:  [place] today, uncertain if he tripped or had a syncopal episode, dizzy after fall. History hypertension, coronary artery disease post MI, stage III chronic kidney disease EXAM: CT HEAD WITHOUT CONTRAST CT MAXILLOFACIAL WITHOUT CONTRAST CT CERVICAL SPINE WITHOUT CONTRAST TECHNIQUE: Multidetector CT imaging of the head, cervical spine, and maxillofacial structures were performed using the standard protocol without intravenous contrast. Multiplanar CT image reconstructions of the cervical spine and maxillofacial structures were also generated. RADIATION DOSE REDUCTION: This exam was performed according to the departmental dose-optimization program which includes automated exposure control, adjustment of the mA and/or kV according to patient size and/or use of  iterative reconstruction technique. COMPARISON:  None FINDINGS: CT HEAD FINDINGS Brain: Generalized atrophy. Normal ventricular morphology. No midline shift or mass effect. Otherwise normal appearance of brain parenchyma. No intracranial hemorrhage, mass lesion, or evidence of acute infarction. No extra-axial fluid collections. Vascular: Atherosclerotic calcification of internal carotid and LEFT vertebral arteries at skull base Skull: Demineralized but intact Other: N/A CT MAXILLOFACIAL FINDINGS Osseous: Scattered beam hardening artifacts of dental origin. Osseous demineralization. TMJ alignment normal bilaterally. Partial slight nasal septal deviation to the RIGHT. No facial bone fractures. Denture plates. Orbits: Bony orbits intact.  No intraorbital fluid or pneumatosis. Sinuses: Paranasal sinuses, mastoid air cells, and middle ear cavities clear. Soft tissues: RIGHT periorbital soft tissue swelling extending supraorbital. CT CERVICAL SPINE FINDINGS Alignment: Normal Skull base and vertebrae: Osseous demineralization. Vertebral body heights maintained. Scattered disc space narrowing with tiny anterior endplate spurs at D34-534. Multilevel facet degenerative changes. Visualized skull base intact. No fracture, subluxation, or bone destruction. Soft tissues and spinal canal: Prevertebral soft tissues normal thickness. Atherosclerotic calcifications at carotid bifurcations and LEFT vertebral artery as well as cranial aspect of aortic arch Disc levels:  No specific abnormalities Upper chest: LEFT upper lobe infiltrate Other: N/A IMPRESSION: Generalized atrophy. No acute intracranial abnormalities. RIGHT periorbital soft tissue swelling without acute facial bone abnormalities. Degenerative disc and facet disease changes of the cervical spine. No acute cervical spine abnormalities. LEFT upper lobe infiltrate. Aortic Atherosclerosis (ICD10-I70.0). Electronically Signed   By: Lavonia Dana M.D.   On: 08/14/2021 17:13  ? ?MR  BRAIN WO CONTRAST ? ?Result Date: 08/15/2021 ?CLINICAL DATA:  Initial evaluation for acute syncope, presyncope, stroke suspected. EXAM: MRI HEAD WITHOUT CONTRAST TECHNIQUE: Multiplanar, multiecho pulse seq

## 2021-08-16 LAB — BASIC METABOLIC PANEL
Anion gap: 5 (ref 5–15)
BUN: 26 mg/dL — ABNORMAL HIGH (ref 8–23)
CO2: 24 mmol/L (ref 22–32)
Calcium: 8.3 mg/dL — ABNORMAL LOW (ref 8.9–10.3)
Chloride: 103 mmol/L (ref 98–111)
Creatinine, Ser: 1.01 mg/dL (ref 0.61–1.24)
GFR, Estimated: >60 mL/min (ref >60)
Glucose, Bld: 119 mg/dL — ABNORMAL HIGH (ref 70–99)
Potassium: 4.1 mmol/L (ref 3.5–5.1)
Sodium: 132 mmol/L — ABNORMAL LOW (ref 135–145)

## 2021-08-16 LAB — HIV ANTIBODY (ROUTINE TESTING W REFLEX): HIV Screen 4th Generation wRfx: NONREACTIVE

## 2021-08-16 LAB — CBC
HCT: 33.3 % — ABNORMAL LOW (ref 39.0–52.0)
Hemoglobin: 11.1 g/dL — ABNORMAL LOW (ref 13.0–17.0)
MCH: 31.1 pg (ref 26.0–34.0)
MCHC: 33.3 g/dL (ref 30.0–36.0)
MCV: 93.3 fL (ref 80.0–100.0)
Platelets: 242 10*3/uL (ref 150–400)
RBC: 3.57 MIL/uL — ABNORMAL LOW (ref 4.22–5.81)
RDW: 12.2 % (ref 11.5–15.5)
WBC: 13.8 10*3/uL — ABNORMAL HIGH (ref 4.0–10.5)
nRBC: 0 % (ref 0.0–0.2)

## 2021-08-16 LAB — GLUCOSE, CAPILLARY: Glucose-Capillary: 120 mg/dL — ABNORMAL HIGH (ref 70–99)

## 2021-08-16 LAB — MAGNESIUM: Magnesium: 2 mg/dL (ref 1.7–2.4)

## 2021-08-16 MED ORDER — SODIUM CHLORIDE 0.9 % IV SOLN
2.0000 g | INTRAVENOUS | Status: DC
  Administered 2021-08-16: 2 g via INTRAVENOUS
  Filled 2021-08-16: qty 2

## 2021-08-16 MED ORDER — LEVOFLOXACIN 500 MG PO TABS: 500.0000 mg | ORAL_TABLET | Freq: Every day | ORAL | 0 refills | Status: AC

## 2021-08-16 NOTE — Discharge Summary (Signed)
? ?Physician Discharge Summary ? ? ?Richard Castaneda  male DOB: Apr 08, 1930  ?[id number] ? ?PCP: Dion Body, MD ? ?Admit date: 08/14/2021 ?Discharge date: 08/16/2021 ? ?Admitted From: home ?Disposition:  home ?Son updated at bedside prior to discharge. ? ?Home Health: Yes ?CODE STATUS: Full code ? ?Discharge Instructions   ? ? Discharge instructions   Complete by: As directed ?  ? You have had 3 days of IV antibiotic for your pneumonia.  Please finish 2 more days of oral antibiotic Levaquin starting tomorrow 4/16. ? ? ?Dr. Enzo Bi ?- ?-  ? ?  ? ?Hospital Course:  ?For full details, please see H&P, progress notes, consult notes and ancillary notes.  ?Briefly,  ?Richard Castaneda is a 88 y.o. male with hypothyroidism, HTN who came in for fall.   ? ?Patient reportedly has had some off-and-on abdominal pain nausea, vomiting and difficulties having bowel movements for the past 6 weeks ? However on the day of presentation, he woke up on the ground and not sure if he tripped or he fainted.   ?  ?Sepsis POA suspected due to Community acquired pneumonia  ?-- with tachycardia, tachypnea, white count of 22k, with positive chest x-ray ?-- Sats 97% on 2 L. Weaned to room air ?--Pt received 3 days of ceftriaxone and azithromycin and felt well with improvement in leukocytosis, and therefore was discharged with 2 more days of Levaquin. ? ?Generalized weakness and possible fall/syncope ?right forehead/I abrasion post fall ?-- MRI shows right periorbital soft tissue swelling without any facial bone abnormality. ?--PT/OT cleared pt to go [facility] with [facility]. ?  ?AKI ?in the setting of pneumonia with generalized weakness ?-- came in with creatinine 1.62, improved with IVF, 1.01 prior to discharge. ? ?Hypothyroidism ?-- TSH 0.987, free T4 2.0 ?-- continue Synthroid 125 ?g PO daily. Follow-up with PCP for repeat TSH level and few weeks ?  ?Hypertension ?-- continue amlodipine ?-- held lisinopril due to kidney injury-- resumed at discharge ?   ?CAD ?continue aspirin and statin ? ? ?Discharge Diagnoses:  ?Principal Problem: ?  Syncope and collapse ?Active Problems: ?  Anemia ?  Coronary artery disease ?  Rectal bleeding ?  Acute respiratory failure with hypoxia ([place]) ?  AKI (acute kidney injury) ([place]) ?  Essential hypertension ?  Hypothyroidism ?  Sepsis ([place]) ?  CAP (community acquired pneumonia) ? ? ?30 Day Unplanned Readmission Risk Score   ? ?Flowsheet Row ED to Hosp-Admission (Current) from 08/14/2021 in [place]  ?30 Day Unplanned Readmission Risk Score (%) 14.7 Filed at 08/16/2021 1200  ? ?  ? ? This score is the patient's risk of an unplanned readmission within 30 days of being discharged (0 -100%). The score is based on dignosis, age, lab data, medications, orders, and past utilization.   ?Low:  0-14.9   Medium: 15-21.9   High: 22-29.9   Extreme: 30 and above ? ?  ? ?  ? ? ?Discharge Instructions: ? ?Allergies as of 08/16/2021   ? ?   Reactions  ? Terazosin   ? Other reaction(s): Orthostatic hypotension  ? ?  ? ?  ?Medication List  ?  ? ?TAKE these medications   ? ?acetaminophen 650 MG CR tablet ?Commonly known as: TYLENOL ?Take 650 mg by mouth 3 (three) times daily. ?  ?amLODipine 5 MG tablet ?Commonly known as: NORVASC ?Take 5 mg by mouth daily before breakfast. ?  ?amoxicillin 500 MG capsule ?Commonly known as: AMOXIL ?TAKE 4  CAPSULES ONE HOUR PRIOR TO DENTAL APPOINTMENT. ?  ?aspirin EC 81 MG tablet ?Take 81 mg by mouth daily. ?  ?ferrous sulfate 325 (65 FE) MG tablet ?Take 325 mg by mouth daily with breakfast. ?  ?finasteride 5 MG tablet ?Commonly known as: PROSCAR ?TAKE 1 TABLET (5 MG TOTAL) BY MOUTH ONCE DAILY. ?  ?Fish Oil 1200 MG Caps ?Take 1,200 mg by mouth daily with breakfast. ?  ?fluticasone 50 MCG/ACT nasal spray ?Commonly known as: FLONASE ?Place 1-2 sprays into both nostrils daily as needed. For allergies. ?  ?levofloxacin 500 MG tablet ?Commonly known as: Levaquin ?Take 1 tablet (500 mg  total) by mouth daily for 2 days. ?Start taking on: August 17, 2021 ?  ?levothyroxine 125 MCG tablet ?Commonly known as: SYNTHROID ?Take 125 mcg by mouth daily. ?  ?lisinopril 40 MG tablet ?Commonly known as: ZESTRIL ?Take 40 mg by mouth daily before breakfast. ?  ?mirabegron ER 50 MG Tb24 tablet ?Commonly known as: Myrbetriq ?Take 1 tablet (50 mg total) by mouth daily. ?  ?Osteo Bi-Flex Adv Triple St Tabs ?Take 1 tablet by mouth 2 (two) times daily. ?  ?simvastatin 20 MG tablet ?Commonly known as: ZOCOR ?Take 20 mg by mouth daily at 8 pm. (2000) ?  ?tetrahydrozoline 0.05 % ophthalmic solution ?Place 1 drop into both eyes daily. [place] Clear ?  ?Vitamin D3 10 MCG (400 UNIT) Caps ?Take 400 Units by mouth daily with supper. ?  ? ?  ? ?  ?  ? ? ?  ?Durable Medical Equipment  ?(From admission, onward)  ?  ? ? ?  ? ?  Start     Ordered  ? 08/15/21 1524  For home use only DME Walker rolling  Once       ?Question Answer Comment  ?Walker: With 5 Inch Wheels   ?Patient needs a walker to treat with the following condition Impaired ambulation   ?  ? 08/15/21 1524  ? ?  ?  ? ?  ? ? ? Follow-up Information   ? ? Dion Body, MD Follow up in 1 week(s).   ?Specialty: Family Medicine ?Contact information: ?[address] ?[place] [address] ?[phone] ? ? ?  ?  ? ?  ?  ? ?  ? ? ?Allergies  ?Allergen Reactions  ? Terazosin   ?  Other reaction(s): Orthostatic hypotension  ? ? ? ?The results of significant diagnostics from this hospitalization (including imaging, microbiology, ancillary and laboratory) are listed below for reference.  ? ?Consultations: ? ? ?Procedures/Studies: ?DG Chest 2 View ? ?Result Date: 08/14/2021 ?CLINICAL DATA:  Shortness of breath and fall. EXAM: CHEST - 2 VIEW COMPARISON:  Chest radiograph 06/14/2017 FINDINGS: The cardiomediastinal contours are normal. Atherosclerosis of the thoracic aorta. There are ill-defined left perihilar opacities. Pulmonary vasculature is normal.  No pneumothorax. No pleural effusion. Chronic wedge deformity of the midthoracic spine. No acute osseous abnormalities are seen. IMPRESSION: Ill-defined left perihilar opacities, atelectasis versus pneumonia. Pulmonary contusion is also considered in the setting of fall. Electronically Signed   By: Keith Rake M.D.   On: 08/14/2021 16:52  ? ?CT HEAD WO CONTRAST (5MM) ? ?Result Date: 08/14/2021 ?CLINICAL DATA:  [place] today, uncertain if he tripped or had a syncopal episode, dizzy after fall. History hypertension, coronary artery disease post MI, stage III chronic kidney disease EXAM: CT HEAD WITHOUT CONTRAST CT MAXILLOFACIAL WITHOUT CONTRAST CT CERVICAL SPINE WITHOUT CONTRAST TECHNIQUE: Multidetector CT imaging of the head, cervical spine,  and maxillofacial structures were performed using the standard protocol without intravenous contrast. Multiplanar CT image reconstructions of the cervical spine and maxillofacial structures were also generated. RADIATION DOSE REDUCTION: This exam was performed according to the departmental dose-optimization program which includes automated exposure control, adjustment of the mA and/or kV according to patient size and/or use of iterative reconstruction technique. COMPARISON:  None FINDINGS: CT HEAD FINDINGS Brain: Generalized atrophy. Normal ventricular morphology. No midline shift or mass effect. Otherwise normal appearance of brain parenchyma. No intracranial hemorrhage, mass lesion, or evidence of acute infarction. No extra-axial fluid collections. Vascular: Atherosclerotic calcification of internal carotid and LEFT vertebral arteries at skull base Skull: Demineralized but intact Other: N/A CT MAXILLOFACIAL FINDINGS Osseous: Scattered beam hardening artifacts of dental origin. Osseous demineralization. TMJ alignment normal bilaterally. Partial slight nasal septal deviation to the RIGHT. No facial bone fractures. Denture plates. Orbits: Bony orbits intact.  No intraorbital  fluid or pneumatosis. Sinuses: Paranasal sinuses, mastoid air cells, and middle ear cavities clear. Soft tissues: RIGHT periorbital soft tissue swelling extending supraorbital. CT CERVICAL SPINE FINDINGS

## 2021-08-16 NOTE — TOC Transition Note (Signed)
Transition of Care (TOC) - CM/SW Discharge Note ? ? ?Patient Details  ?Name: Richard Castaneda ?MRN: [id number] ?Date of Birth: 07/19/1929 ? ?Transition of Care (TOC) CM/SW Contact:  ?Luvenia Redden, RN ?Phone Number:[phone] ?08/16/2021, 1:54 PM ? ? ?Clinical Narrative:    ?RN alerted Jacquenette Shone) pt will be discharged today from the hospital. No other needs presented. Bedside nurse indicates RW was delivered. ? ?TOC remains available to assist with any other needs. ? ?Final next level of care: Home w Home Health Services ?Barriers to Discharge: Continued Medical Work up ? ? ?Patient Goals and CMS Choice ?  ?  ?Choice offered to / list presented to : NA ? ?Discharge Placement ?  ?           ?  ?  ?  ?  ? ?Discharge Plan and Services ?  ?Discharge Planning Services: CM Consult ?Post Acute Care Choice: Durable Medical Equipment, Home Health          ?DME Arranged: Walker rolling ?DME Agency: [employer] ?Date DME Agency Contacted: 08/15/21 ?  ?Representative spoke with at DME Agency: rhonda ?HH Arranged: PT ?  ?  ?  ?  ? ?Social Determinants of Health (SDOH) Interventions ?  ? ? ?Readmission Risk Interventions ? ?  08/15/2021  ?  3:18 PM  ?Readmission Risk Prevention Plan  ?Post Dischage Appt Complete  ?Medication Screening Complete  ?Transportation Screening Complete  ? ? ? ? ? ?

## 2021-08-19 LAB — CULTURE, BLOOD (ROUTINE X 2)
Culture: NO GROWTH
Culture: NO GROWTH
Special Requests: ADEQUATE
Special Requests: ADEQUATE

## 2022-01-08 ENCOUNTER — Other Ambulatory Visit: Payer: Self-pay | Admitting: *Deleted

## 2022-01-08 DIAGNOSIS — N138 Other obstructive and reflux uropathy: Secondary | ICD-10-CM

## 2022-01-09 ENCOUNTER — Other Ambulatory Visit: Payer: Medicare Other

## 2022-01-09 DIAGNOSIS — N138 Other obstructive and reflux uropathy: Secondary | ICD-10-CM

## 2022-01-10 LAB — PSA: Prostate Specific Ag, Serum: 4.7 ng/mL — ABNORMAL HIGH (ref 0.0–4.0)

## 2022-01-12 ENCOUNTER — Other Ambulatory Visit: Payer: Medicare Other

## 2022-01-13 NOTE — Progress Notes (Unsigned)
04/20/2018 9:36 PM   Richard Castaneda 10-01-1929 [id number]  Referring provider: Dion Body, MD [facility] [place],  [place] [id number]  Urological history: 1. BPH with LU TS - cystoscopy on 11/26/2016 which showed an enlarged prostate Only 2-3 cm in length. Minimally obstructive -I PSS *** -finasteride 5 mg daily  2. Frequency -Myrbetriq 50 mg daily  3. Nocturia -no sleep study  4. Renal cysts -contrast CT (2023) - Cyst in the right kidney upper pole. Peripheral low-density structure in the right kidney interpolar region measures 0.6 cm and too small to definitively characterize. Evidence for cysts in the left kidney lower pole.   No chief complaint on file.   HPI: Richard Castaneda is a 88 y.o. male who presents today for yearly follow up with his son, Richard Castaneda.            Score:  1-7 Mild 8-19 Moderate 20-35 Severe   PMH: Past Medical History:  Diagnosis Date   Acquired hypothyroidism 11/16/2016   Anterior myocardial infarction ([place]) 12/29/2013   Overview:  2004   Benign essential HTN 12/29/2013   Benign prostatic hyperplasia with urinary frequency 10/19/2016   Borderline diabetes mellitus 11/16/2016   Coronary artery disease 12/29/2013   Overview:  Anterior MI, PCI and stent placement of LAD11/04   H/O iron deficiency anemia 09/09/2015   History of BPH    Inguinal hernia, bilateral    Moderate tricuspid insufficiency 09/12/2014   Occasional tremors    Left hand only with writing and eating   Pre-diabetes    Primary osteoarthritis of left knee 08/26/2016   Pure hypercholesterolemia 12/29/2013   PVD (peripheral vascular disease) ([place]) 12/29/2013   Overview:  With carotid atherosclerosis     Surgical History: Past Surgical History:  Procedure Laterality Date   CATARACT EXTRACTION W/ INTRAOCULAR LENS  IMPLANT, BILATERAL     CORONARY ANGIOPLASTY  2004   1 stent   EYE SURGERY Bilateral    Cataract Extraction with  IOL   HERNIA REPAIR Bilateral    Inguinal Hernia Repair   PARTIAL KNEE ARTHROPLASTY Left 12/31/2016   Procedure: UNICOMPARTMENTAL KNEE;  Surgeon: Corky Mull, MD;  Location: [facility] ORS;  Service: Orthopedics;  Laterality: Left;   TOTAL KNEE REVISION Left 06/22/2017   Procedure: TOTAL KNEE REVISION, CONVERTING A PARTIAL KNEE TO A TOTAL;  Surgeon: Corky Mull, MD;  Location: [facility] ORS;  Service: Orthopedics;  Laterality: Left;    Home Medications:  Allergies as of 01/14/2022       Reactions   Terazosin    Other reaction(s): Orthostatic hypotension        Medication List        Accurate as of January 13, 2022  9:36 PM. If you have any questions, ask your nurse or doctor.          acetaminophen 650 MG CR tablet Commonly known as: TYLENOL Take 650 mg by mouth 3 (three) times daily.   amLODipine 5 MG tablet Commonly known as: NORVASC Take 5 mg by mouth daily before breakfast.   amoxicillin 500 MG capsule Commonly known as: AMOXIL TAKE 4 CAPSULES ONE HOUR PRIOR TO DENTAL APPOINTMENT.   aspirin EC 81 MG tablet Take 81 mg by mouth daily.   ferrous sulfate 325 (65 FE) MG tablet Take 325 mg by mouth daily with breakfast.   finasteride 5 MG tablet Commonly known as: PROSCAR TAKE 1 TABLET (5 MG TOTAL) BY MOUTH ONCE DAILY.   Fish Oil  1200 MG Caps Take 1,200 mg by mouth daily with breakfast.   fluticasone 50 MCG/ACT nasal spray Commonly known as: FLONASE Place 1-2 sprays into both nostrils daily as needed. For allergies.   levothyroxine 125 MCG tablet Commonly known as: SYNTHROID Take 125 mcg by mouth daily.   lisinopril 40 MG tablet Commonly known as: ZESTRIL Take 40 mg by mouth daily before breakfast.   mirabegron ER 50 MG Tb24 tablet Commonly known as: Myrbetriq Take 1 tablet (50 mg total) by mouth daily.   Osteo Bi-Flex Adv Triple St Tabs Take 1 tablet by mouth 2 (two) times daily.   simvastatin 20 MG tablet Commonly known as: ZOCOR Take 20 mg by mouth  daily at 8 pm. (2000)   tetrahydrozoline 0.05 % ophthalmic solution Place 1 drop into both eyes daily. Visine Clear   Vitamin D3 10 MCG (400 UNIT) Caps Take 400 Units by mouth daily with supper.        Allergies:  Allergies  Allergen Reactions   Terazosin     Other reaction(s): Orthostatic hypotension    Family History: Family History  Problem Relation Age of Onset   Kidney failure Mother    Prostate cancer Paternal Uncle    Bladder Cancer Neg Hx    Kidney cancer Neg Hx     Social History:  reports that he has never smoked. He has never used smokeless tobacco. He reports that he does not drink alcohol and does not use drugs.  For pertinent review of systems please refer to history of present illness  Physical Exam: There were no vitals taken for this visit.  Constitutional:  Well nourished. Alert and oriented, No acute distress. HEENT: [place] AT, moist mucus membranes.  Trachea midline Cardiovascular: No clubbing, cyanosis, or edema. Respiratory: Normal respiratory effort, no increased work of breathing. GU: No CVA tenderness.  No bladder fullness or masses.  Patient with circumcised/uncircumcised phallus. ***Foreskin easily retracted***  Urethral meatus is patent.  No penile discharge. No penile lesions or rashes. Scrotum without lesions, cysts, rashes and/or edema.  Testicles are located scrotally bilaterally. No masses are appreciated in the testicles. Left and right epididymis are normal. Rectal: Patient with  normal sphincter tone. Anus and perineum without scarring or rashes. No rectal masses are appreciated. Prostate is approximately *** grams, *** nodules are appreciated. Seminal vesicles are normal. Neurologic: Grossly intact, no focal deficits, moving all 4 extremities. Psychiatric: Normal mood and affect.   Laboratory Data: Component     Latest Ref Rng 01/09/2022  Prostate Specific Ag, Serum     0.0 - 4.0 ng/mL 4.7 (H)     Legend: (H) High  Glucose 70 - 110  mg/dL 96   Sodium 136 - 145 mmol/L 139   Potassium 3.6 - 5.1 mmol/L 4.8   Chloride 97 - 109 mmol/L 105   Carbon Dioxide (CO2) 22.0 - 32.0 mmol/L 27.7   Urea Nitrogen (BUN) 7 - 25 mg/dL 40 High    Creatinine 0.7 - 1.3 mg/dL 1.5 High    Glomerular Filtration Rate (eGFR), MDRD Estimate >60 mL/min/1.73sq m 44 Low    Calcium 8.7 - 10.3 mg/dL 9.0   AST  8 - 39 U/L 16   ALT  6 - 57 U/L 15   Alk Phos (alkaline Phosphatase) 34 - 104 U/L 64   Albumin 3.5 - 4.8 g/dL 4.0   Bilirubin, Total 0.3 - 1.2 mg/dL 0.6   Protein, Total 6.1 - 7.9 g/dL 6.0 Low    A/G Ratio  1.0 - 5.0 gm/dL 2.0   Resulting Agency  [facility] - LAB   Specimen Collected: 10/24/21 09:48   Performed by: [facility] - [facility] Last Resulted: 10/24/21 15:01  Received From: [facility]  Result Received: 12/06/21 10:17   Hemoglobin A1C 4.2 - 5.6 % 6.0 High    Average Blood Glucose (Calc) mg/dL 126   Resulting Agency  [facility] - LAB  Narrative Performed by [facility] - LAB Normal Range:    4.2 - 5.6%  Increased Risk:  5.7 - 6.4%  Diabetes:        >= 6.5%  Glycemic Control for adults with diabetes:  <7%    Specimen Collected: 10/24/21 09:48   Performed by: [facility]: 10/24/21 13:03  Received From: [facility]  Result Received: 12/06/21 10:17   Cholesterol, Total 100 - 200 mg/dL 154   Triglyceride 35 - 199 mg/dL 66   HDL (High Density Lipoprotein) Cholesterol 29.0 - 71.0 mg/dL 59.2   LDL Calculated 0 - 130 mg/dL 82   VLDL Cholesterol mg/dL 13   Cholesterol/HDL Ratio  2.6   Resulting Agency  [facility] - LAB   Specimen Collected: 10/24/21 09:48   Performed by: [facility]: 10/24/21 14:58  Received From: [facility]  Result Received: 12/06/21 10:17   WBC (White Blood Cell Count) 4.1 - 10.2 10^3/uL 6.6   RBC (Red Blood Cell Count) 4.69 - 6.13 10^6/uL 3.72 Low     Hemoglobin 14.1 - 18.1 gm/dL 11.8 Low    Hematocrit 40.0 - 52.0 % 36.3 Low    MCV (Mean Corpuscular Volume) 80.0 - 100.0 fl 97.6   MCH (Mean Corpuscular Hemoglobin) 27.0 - 31.2 pg 31.7 High    MCHC (Mean Corpuscular Hemoglobin Concentration) 32.0 - 36.0 gm/dL 32.5   Platelet Count 150 - 450 10^3/uL 255   RDW-CV (Red Cell Distribution Width) 11.6 - 14.8 % 12.7   MPV (Mean Platelet Volume) 9.4 - 12.4 fl 9.8   Neutrophils 1.50 - 7.80 10^3/uL 4.47   Lymphocytes 1.00 - 3.60 10^3/uL 1.45   Monocytes 0.00 - 1.50 10^3/uL 0.47   Eosinophils 0.00 - 0.55 10^3/uL 0.13   Basophils 0.00 - 0.09 10^3/uL 0.03   Neutrophil % 32.0 - 70.0 % 67.9   Lymphocyte % 10.0 - 50.0 % 22.1   Monocyte % 4.0 - 13.0 % 7.2   Eosinophil % 1.0 - 5.0 % 2.0   Basophil% 0.0 - 2.0 % 0.5   Immature Granulocyte % <=0.7 % 0.3   Immature Granulocyte Count <=0.06 10^3/L 0.02   Resulting Agency  [facility] - LAB   Specimen Collected: 10/24/21 09:48   Performed by: [facility]: 10/24/21 11:02  Received From: [facility]  Result Received: 12/06/21 10:17  I have reviewed the labs.   Pertinent Imaging: CLINICAL DATA:  Abdominal pain.  New rectal bleeding.   EXAM: CT ABDOMEN AND PELVIS WITH CONTRAST   TECHNIQUE: Multidetector CT imaging of the abdomen and pelvis was performed using the standard protocol following bolus administration of intravenous contrast.   RADIATION DOSE REDUCTION: This exam was performed according to the departmental dose-optimization program which includes automated exposure control, adjustment of the mA and/or kV according to patient size and/or use of iterative reconstruction technique.   CONTRAST:  30m OMNIPAQUE IOHEXOL 300 MG/ML  SOLN   COMPARISON:  CT abdomen and pelvis 05/01/2010   FINDINGS: Lower chest: Parenchymal opacities in the superior segment of left lower lobe and in the posterior left upper lobe. This is suggestive for  airspace disease and pneumonia. Subpleural nodule in the right lower lobe on sequence 4, image 32 measures 5 mm and stable since 2011. Additional stable subpleural nodule in the left lower lobe on image 51. Focal density along the right minor fissure on image 15 is likely an incidental finding.   Hepatobiliary: Normal appearance of the liver and gallbladder. Portal venous system is patent. No biliary dilatation.   Pancreas: No evidence for pancreatic duct dilatation or inflammatory changes. Question a 6 mm low-density or cystic structure in the pancreatic body on sequence 2 image 33 and this may have been present on the previous examination.   Spleen: Normal in size without focal abnormality.   Adrenals/Urinary Tract: Normal adrenal glands. Cyst in the right kidney upper pole. Peripheral low-density structure in the right kidney interpolar region measures 0.6 cm and too small to definitively characterize. Evidence for cysts in the left kidney lower pole. These renal structures do not require follow-up. Negative for hydronephrosis. Mild wall thickening in the urinary bladder.   Stomach/Bowel: Normal appearance of the stomach. Mildly dilated loops of small bowel in the anterior left abdomen. No evidence for an obstruction or focal bowel inflammation. Cecum appears to be located in the anterior right abdomen.   Vascular/Lymphatic: Aortic atherosclerosis. No enlarged abdominal or pelvic lymph nodes.   Reproductive: Prostate is enlarged with calcifications. Prostate measures 5.1 cm in transverse dimension.   Other: Right inguinal hernia with small amount of fluid and/or stranding. Again noted are postoperative changes along the anterior pelvis and could be related to prior hernia repair. Trace fluid in the right lower quadrant of the abdomen on sequence 2 image 75. Trace pelvic fluid. Negative for free air.   Musculoskeletal: Disc space narrowing at L4-L5 and L5-S1. Old vertebral  body compression deformity at T7 which was present on a chest radiograph from 06/14/2017. Age-indeterminate compression deformity at T9. Bone structures are slightly heterogeneous and may be associated osteopenia.   IMPRESSION: 1. Airspace disease in the superior segment of the left lower lobe and posterior left upper lobe. Findings are suggestive for left lung pneumonia. 2. Right inguinal hernia containing a small amount of fluid and/or stranding. 3. Trace fluid in the right lower quadrant of the abdomen and in the pelvis. Etiology is indeterminate but could be related to occult inflammatory process in the abdomen or pelvis. 4. Bilateral renal cysts.  No follow-up needed. 5. Questionable hypodensity in the pancreatic body region. This may have been present on the previous examination and likely an incidental finding. 6. Old T7 vertebral body compression deformity. Age indeterminate compression deformity along the superior endplate of T9.     Electronically Signed   By: Markus Daft M.D.   On: 08/14/2021 17:23 I have independently reviewed the films.  See HPI.    Assessment & Plan:    1. BPH with LU TS -Stable symptoms -Continue finasteride 5 mg -Refill sent to pharmacy for finasteride  2. Frequency -Stable symptoms -Continue Myrbetriq 50 mg daily, but every other day  -Refill sent for Myrbetriq to pharmacy   No follow-ups on file.  [facility], [place] [address] [place], [place] [address] [phone]

## 2022-01-14 ENCOUNTER — Ambulatory Visit: Payer: Medicare Other | Admitting: Urology

## 2022-01-14 ENCOUNTER — Encounter: Payer: Self-pay | Admitting: Urology

## 2022-01-14 VITALS — BP 143/64 | HR 71 | Ht 64.0 in | Wt 146.0 lb

## 2022-01-14 DIAGNOSIS — N138 Other obstructive and reflux uropathy: Secondary | ICD-10-CM | POA: Diagnosis not present

## 2022-01-14 DIAGNOSIS — R350 Frequency of micturition: Secondary | ICD-10-CM

## 2022-01-14 DIAGNOSIS — N401 Enlarged prostate with lower urinary tract symptoms: Secondary | ICD-10-CM | POA: Diagnosis not present

## 2022-01-14 LAB — BLADDER SCAN AMB NON-IMAGING

## 2022-03-16 ENCOUNTER — Telehealth: Payer: Self-pay

## 2022-03-16 NOTE — Telephone Encounter (Signed)
Pt's daughter stopped by the office. She requested samples of Myrbetriq 50mg .   Left 2 boxes of 25mg  up front for p/u.

## 2022-03-16 NOTE — Telephone Encounter (Signed)
Message left on triage line 1pm  [name]- pts daughter is requesting samples for a medication until 1/24.  Returned call- n/a. LMTRC

## 2022-07-20 ENCOUNTER — Other Ambulatory Visit: Payer: Self-pay | Admitting: Urology

## 2022-08-17 ENCOUNTER — Other Ambulatory Visit: Payer: Self-pay | Admitting: Urology

## 2022-08-19 ENCOUNTER — Encounter: Payer: Self-pay | Admitting: Physician Assistant

## 2022-08-19 ENCOUNTER — Ambulatory Visit: Payer: Medicare Other | Admitting: Physician Assistant

## 2022-08-19 VITALS — BP 119/64 | HR 62 | Ht 64.0 in | Wt 149.0 lb

## 2022-08-19 DIAGNOSIS — R350 Frequency of micturition: Secondary | ICD-10-CM

## 2022-08-19 DIAGNOSIS — R351 Nocturia: Secondary | ICD-10-CM | POA: Diagnosis not present

## 2022-08-19 LAB — URINALYSIS, COMPLETE
Bilirubin, UA: NEGATIVE
Glucose, UA: NEGATIVE
Ketones, UA: NEGATIVE
Nitrite, UA: NEGATIVE
Protein,UA: NEGATIVE
RBC, UA: NEGATIVE
Specific Gravity, UA: 1.02 (ref 1.005–1.030)
Urobilinogen, Ur: 0.2 mg/dL (ref 0.2–1.0)
pH, UA: 5.5 (ref 5.0–7.5)

## 2022-08-19 LAB — MICROSCOPIC EXAMINATION

## 2022-08-19 LAB — BLADDER SCAN AMB NON-IMAGING: Scan Result: 25

## 2022-08-19 MED ORDER — GEMTESA 75 MG PO TABS
75.0000 mg | ORAL_TABLET | Freq: Every day | ORAL | 0 refills | Status: DC
Start: 2022-08-19 — End: 2022-09-30

## 2022-08-19 NOTE — Progress Notes (Signed)
08/19/2022 2:26 PM   Richard Castaneda 03-04-1930 [id number]  CC: Chief Complaint  Patient presents with   Follow-up   HPI: Richard Castaneda is a 89 y.o. male with PMH BPH with urinary frequency on Myrbetriq 50 mg and finasteride daily and bilateral renal cysts who presents today for evaluation of urinary frequency.  He is accompanied today by his son, Richard Castaneda, who contributes to HPI.  Today he reports nightly nocturia starting around 4 AM.  His son reports that he for started complaining about this about 3 weeks ago, however the overall duration of this is unknown.  He takes his urinary medications at the end of the day.  He has never had a sleep study and his son has not noticed him snoring when he spends 1 night a week at his home.  He is not circumcised and notes a spraying urine stream secondary to this.  In-office UA today positive for 1+ leukocytes; urine microscopy with 6-10 WBCs/HPF, and moderate bacteria. PVR 25mL.  PMH: Past Medical History:  Diagnosis Date   Acquired hypothyroidism 11/16/2016   Anterior myocardial infarction ([facility]) 12/29/2013   Overview:  2004   Benign essential HTN 12/29/2013   Benign prostatic hyperplasia with urinary frequency 10/19/2016   Borderline diabetes mellitus 11/16/2016   Coronary artery disease 12/29/2013   Overview:  Anterior MI, PCI and stent placement of LAD11/04   H/O iron deficiency anemia 09/09/2015   History of BPH    Inguinal hernia, bilateral    Moderate tricuspid insufficiency 09/12/2014   Occasional tremors    Left hand only with writing and eating   Pre-diabetes    Primary osteoarthritis of left knee 08/26/2016   Pure hypercholesterolemia 12/29/2013   PVD (peripheral vascular disease) ([facility]) 12/29/2013   Overview:  With carotid atherosclerosis     Surgical History: Past Surgical History:  Procedure Laterality Date   CATARACT EXTRACTION W/ INTRAOCULAR LENS  IMPLANT, BILATERAL     CORONARY ANGIOPLASTY  2004   1 stent   EYE  SURGERY Bilateral    Cataract Extraction with IOL   HERNIA REPAIR Bilateral    Inguinal Hernia Repair   PARTIAL KNEE ARTHROPLASTY Left 12/31/2016   Procedure: UNICOMPARTMENTAL KNEE;  Surgeon: Christena Flake, MD;  Location: [facility] ORS;  Service: Orthopedics;  Laterality: Left;   TOTAL KNEE REVISION Left 06/22/2017   Procedure: TOTAL KNEE REVISION, CONVERTING A PARTIAL KNEE TO A TOTAL;  Surgeon: Christena Flake, MD;  Location: [facility] ORS;  Service: Orthopedics;  Laterality: Left;    Home Medications:  Allergies as of 08/19/2022       Reactions   Terazosin    Other reaction(s): Orthostatic hypotension        Medication List        Accurate as of August 19, 2022  2:26 PM. If you have any questions, ask your nurse or doctor.          acetaminophen 650 MG CR tablet Commonly known as: TYLENOL Take 650 mg by mouth 3 (three) times daily.   amLODipine 5 MG tablet Commonly known as: NORVASC Take 5 mg by mouth daily before breakfast.   amoxicillin 500 MG capsule Commonly known as: AMOXIL TAKE 4 CAPSULES ONE HOUR PRIOR TO DENTAL APPOINTMENT.   aspirin EC 81 MG tablet Take 81 mg by mouth daily.   ferrous sulfate 325 (65 FE) MG tablet Take 325 mg by mouth daily with breakfast.   finasteride 5 MG tablet Commonly known as: PROSCAR TAKE 1 TABLET (5  MG TOTAL) BY MOUTH ONCE DAILY.   Fish Oil 1200 MG Caps Take 1,200 mg by mouth daily with breakfast.   fluticasone 50 MCG/ACT nasal spray Commonly known as: FLONASE Place 1-2 sprays into both nostrils daily as needed. For allergies.   levothyroxine 125 MCG tablet Commonly known as: SYNTHROID Take 125 mcg by mouth daily.   lisinopril 40 MG tablet Commonly known as: ZESTRIL Take 40 mg by mouth daily before breakfast.   Myrbetriq 50 MG Tb24 tablet Generic drug: mirabegron ER Take 1 tablet (50 mg total) by mouth daily.   Osteo Bi-Flex Adv Triple St Tabs Take 1 tablet by mouth 2 (two) times daily.   simvastatin 20 MG tablet Commonly  known as: ZOCOR Take 20 mg by mouth daily at 8 pm. (2000)   tetrahydrozoline 0.05 % ophthalmic solution Place 1 drop into both eyes daily. Visine Clear   Vitamin D3 10 MCG (400 UNIT) Caps Take 400 Units by mouth daily with supper.        Allergies:  Allergies  Allergen Reactions   Terazosin     Other reaction(s): Orthostatic hypotension    Family History: Family History  Problem Relation Age of Onset   Kidney failure Mother    Prostate cancer Paternal Uncle    Bladder Cancer Neg Hx    Kidney cancer Neg Hx     Social History:   reports that he has never smoked. He has never used smokeless tobacco. He reports that he does not drink alcohol and does not use drugs.  Physical Exam: Ht  (1.626 m)   Wt 146 lb (66.2 kg)   BMI 25.06 kg/m   Constitutional:  Alert and oriented, no acute distress, nontoxic appearing HEENT: [place], AT Cardiovascular: No clubbing, cyanosis, or edema Respiratory: Normal respiratory effort, no increased work of breathing Skin: No rashes, bruises or suspicious lesions Neurologic: Grossly intact, no focal deficits, moving all 4 extremities Psychiatric: Normal mood and affect  Laboratory Data: Results for orders placed or performed in visit on 08/19/22  Microscopic Examination   Urine  Result Value Ref Range   WBC, UA 6-10 (A) 0 - 5 /hpf   RBC, Urine 0-2 0 - 2 /hpf   Epithelial Cells (non renal) 0-10 0 - 10 /hpf   Casts Present (A) None seen /lpf   Cast Type Hyaline casts N/A   Mucus, UA Present (A) Not Estab.   Bacteria, UA Moderate (A) None seen/Few  Urinalysis, Complete  Result Value Ref Range   Specific Gravity, UA 1.020 1.005 - 1.030   pH, UA 5.5 5.0 - 7.5   Color, UA Yellow Yellow   Appearance Ur Hazy (A) Clear   Leukocytes,UA 1+ (A) Negative   Protein,UA Negative Negative/Trace   Glucose, UA Negative Negative   Ketones, UA Negative Negative   RBC, UA Negative Negative   Bilirubin, UA Negative Negative   Urobilinogen, Ur 0.2  0.2 - 1.0 mg/dL   Nitrite, UA Negative Negative   Microscopic Examination See below:   Bladder Scan (Post Void Residual) in office  Result Value Ref Range   Scan Result 25    Assessment & Plan:   1. Nocturia Hourly nocturia is to adding around 4 AM, chronicity unclear.  We discussed that nocturia alone is very challenging to treat because it is often secondary to nonurologic issues.  Will switch him from Myrbetriq to [place], samples provided today, and plan for symptom recheck and PVR in 6 weeks.  I do not recommend  initiation of desmopressin given his age.  Unfortunately, we may not be able to resolve this.  UA today with mild pyuria and bacteriuria, strongly suspect sample contamination in the setting of foreskin. - Urinalysis, Complete - Bladder Scan (Post Void Residual) in office - Vibegron (GEMTESA) 75 MG TABS; Take 1 tablet (75 mg total) by mouth daily.  Dispense: 42 tablet; Refill: 0 - CULTURE, URINE COMPREHENSIVE   Return in about 6 weeks (around 09/30/2022) for Symptom recheck with PVR.  Carman Ching, PA-C  [facility] [place] [address], [address] [place], [place] [address] [phone]

## 2022-08-20 ENCOUNTER — Other Ambulatory Visit: Payer: Self-pay

## 2022-08-22 LAB — CULTURE, URINE COMPREHENSIVE

## 2022-08-23 LAB — CULTURE, URINE COMPREHENSIVE

## 2022-08-25 ENCOUNTER — Telehealth: Payer: Self-pay | Admitting: *Deleted

## 2022-08-25 NOTE — Telephone Encounter (Signed)
-----   Message from Carman Ching, [place] sent at 08/24/2022  5:02 PM EDT ----- Please call and check his symptoms.  When we spoke in clinic, he was not having any dysuria.  If this is still the case, I do not recommend antibiotics.  If he has new burning pain with urination, would recommend Bactrim DS twice daily x 7 days.

## 2022-08-25 NOTE — Telephone Encounter (Signed)
.  left message to have patient return my call.  

## 2022-08-26 NOTE — Telephone Encounter (Signed)
Pt's daughter, Isabella Bowens, (on [place]) [facility] that we left message for pt yesterday and she handles all of his health related appts, etc...  She would like for you to give her a call [phone].

## 2022-08-27 NOTE — Telephone Encounter (Signed)
Spoke with daughter and pt is not having any symptoms.

## 2022-08-27 NOTE — Telephone Encounter (Signed)
Patient's daughter, [name], called and left message  requesting call regarding message we left for patient. Her number is [phone].

## 2022-09-30 ENCOUNTER — Ambulatory Visit: Payer: Medicare Other | Admitting: Physician Assistant

## 2022-09-30 ENCOUNTER — Encounter: Payer: Self-pay | Admitting: Physician Assistant

## 2022-09-30 VITALS — BP 97/58 | HR 64 | Ht 66.0 in | Wt 144.0 lb

## 2022-09-30 DIAGNOSIS — R351 Nocturia: Secondary | ICD-10-CM | POA: Diagnosis not present

## 2022-09-30 LAB — BLADDER SCAN AMB NON-IMAGING: Scan Result: 0

## 2022-09-30 MED ORDER — GEMTESA 75 MG PO TABS: 75.0000 mg | ORAL_TABLET | Freq: Every day | ORAL | 11 refills | Status: DC

## 2022-09-30 NOTE — Progress Notes (Signed)
09/30/2022 1:34 PM   Richard Castaneda [birth year] [id number]  CC: Chief Complaint  Patient presents with   Nocturia   HPI: Richard Castaneda is a 89 y.o. male with PMH BPH with urinary frequency, bilateral renal cysts, and nocturia who presents today for symptom recheck after switching from Myrbetriq to [place].  He is accompanied today by his son, who contributes to HPI.  Today he reports improvement in nocturia on Gemtesa, which he takes prior to bed.  He is now reporting nocturia x 1-2, still be getting around 4 AM.  He is pleased with his progress and feels that he is much better rested than prior. PVR 0mL.  PMH: Past Medical History:  Diagnosis Date   Acquired hypothyroidism 11/16/2016   Anterior myocardial infarction (HCC) 12/29/2013   Overview:  2004   Benign essential HTN 12/29/2013   Benign prostatic hyperplasia with urinary frequency 10/19/2016   Borderline diabetes mellitus 11/16/2016   Coronary artery disease 12/29/2013   Overview:  Anterior MI, PCI and stent placement of LAD11/04   H/O iron deficiency anemia 09/09/2015   History of BPH    Inguinal hernia, bilateral    Moderate tricuspid insufficiency 09/12/2014   Occasional tremors    Left hand only with writing and eating   Pre-diabetes    Primary osteoarthritis of left knee 08/26/2016   Pure hypercholesterolemia 12/29/2013   PVD (peripheral vascular disease) (HCC) 12/29/2013   Overview:  With carotid atherosclerosis     Surgical History: Past Surgical History:  Procedure Laterality Date   CATARACT EXTRACTION W/ INTRAOCULAR LENS  IMPLANT, BILATERAL     CORONARY ANGIOPLASTY  2004   1 stent   EYE SURGERY Bilateral    Cataract Extraction with IOL   HERNIA REPAIR Bilateral    Inguinal Hernia Repair   PARTIAL KNEE ARTHROPLASTY Left 12/31/2016   Procedure: UNICOMPARTMENTAL KNEE;  Surgeon: Christena Flake, MD;  Location: [facility] ORS;  Service: Orthopedics;  Laterality: Left;   TOTAL KNEE REVISION Left 06/22/2017    Procedure: TOTAL KNEE REVISION, CONVERTING A PARTIAL KNEE TO A TOTAL;  Surgeon: Christena Flake, MD;  Location: [facility] ORS;  Service: Orthopedics;  Laterality: Left;    Home Medications:  Allergies as of 09/30/2022       Reactions   Terazosin    Other reaction(s): Orthostatic hypotension        Medication List        Accurate as of Sep 30, 2022  1:34 PM. If you have any questions, ask your nurse or doctor.          acetaminophen 650 MG CR tablet Commonly known as: TYLENOL Take 650 mg by mouth 3 (three) times daily.   amLODipine 5 MG tablet Commonly known as: NORVASC Take 5 mg by mouth daily before breakfast.   aspirin EC 81 MG tablet Take 81 mg by mouth daily.   DSS 100 MG Caps Take by mouth.   ferrous sulfate 325 (65 FE) MG tablet Take 325 mg by mouth daily with breakfast.   finasteride 5 MG tablet Commonly known as: PROSCAR TAKE 1 TABLET (5 MG TOTAL) BY MOUTH ONCE DAILY.   Fish Oil 1200 MG Caps Take 1,200 mg by mouth daily with breakfast.   Gemtesa 75 MG Tabs Generic drug: Vibegron Take 1 tablet (75 mg total) by mouth daily.   levothyroxine 125 MCG tablet Commonly known as: SYNTHROID Take 125 mcg by mouth daily.   lisinopril 40 MG tablet Commonly known as: ZESTRIL Take 40 mg  by mouth daily before breakfast.   Myrbetriq 50 MG Tb24 tablet Generic drug: mirabegron ER Take 1 tablet (50 mg total) by mouth daily.   Osteo Bi-Flex Adv Triple St Tabs Take 1 tablet by mouth 2 (two) times daily.   simvastatin 20 MG tablet Commonly known as: ZOCOR Take 20 mg by mouth daily at 8 pm. (2000)   tetrahydrozoline 0.05 % ophthalmic solution Place 1 drop into both eyes daily. Visine Clear   Vitamin D3 10 MCG (400 UNIT) Caps Take 400 Units by mouth daily with supper.        Allergies:  Allergies  Allergen Reactions   Terazosin     Other reaction(s): Orthostatic hypotension    Family History: Family History  Problem Relation Age of Onset   Kidney  failure Mother    Prostate cancer Paternal Uncle    Bladder Cancer Neg Hx    Kidney cancer Neg Hx     Social History:   reports that he has never smoked. He has never used smokeless tobacco. He reports that he does not drink alcohol and does not use drugs.  Physical Exam: There were no vitals taken for this visit.  Constitutional:  Alert and oriented, no acute distress, nontoxic appearing HEENT: [place], AT Cardiovascular: No clubbing, cyanosis, or edema Respiratory: Normal respiratory effort, no increased work of breathing Skin: No rashes, bruises or suspicious lesions Neurologic: Grossly intact, no focal deficits, moving all 4 extremities Psychiatric: Normal mood and affect  Laboratory Data: Results for orders placed or performed in visit on 09/30/22  Bladder Scan (Post Void Residual) in office  Result Value Ref Range   Scan Result 0    Assessment & Plan:   1. Nocturia Significant symptomatic improvement on Gemtesa, incomplete therapeutic response on Myrbetriq.  Will continue Gemtesa and see him back next year for annual follow-up. - Bladder Scan (Post Void Residual) in office - Vibegron (GEMTESA) 75 MG TABS; Take 1 tablet (75 mg total) by mouth daily.  Dispense: 30 tablet; Refill: 11  Urinalysis, Complete  Return in about 1 year (around 09/30/2023) for Annual f/u with [facility].  Carman Ching, PA-C  [facility] [place] [address] [place], [place] [address] [phone]

## 2022-10-11 ENCOUNTER — Emergency Department: Payer: Medicare Other

## 2022-10-11 ENCOUNTER — Other Ambulatory Visit: Payer: Self-pay

## 2022-10-11 DIAGNOSIS — S6291XA Unspecified fracture of right wrist and hand, initial encounter for closed fracture: Secondary | ICD-10-CM | POA: Diagnosis present

## 2022-10-11 DIAGNOSIS — R531 Weakness: Secondary | ICD-10-CM | POA: Diagnosis not present

## 2022-10-11 DIAGNOSIS — W010XXA Fall on same level from slipping, tripping and stumbling without subsequent striking against object, initial encounter: Secondary | ICD-10-CM | POA: Diagnosis not present

## 2022-10-11 DIAGNOSIS — Z79899 Other long term (current) drug therapy: Secondary | ICD-10-CM | POA: Insufficient documentation

## 2022-10-11 DIAGNOSIS — Y92019 Unspecified place in single-family (private) house as the place of occurrence of the external cause: Secondary | ICD-10-CM | POA: Insufficient documentation

## 2022-10-11 DIAGNOSIS — Z955 Presence of coronary angioplasty implant and graft: Secondary | ICD-10-CM | POA: Diagnosis not present

## 2022-10-11 DIAGNOSIS — Z96652 Presence of left artificial knee joint: Secondary | ICD-10-CM | POA: Diagnosis not present

## 2022-10-11 DIAGNOSIS — S52501A Unspecified fracture of the lower end of right radius, initial encounter for closed fracture: Principal | ICD-10-CM | POA: Insufficient documentation

## 2022-10-11 DIAGNOSIS — E860 Dehydration: Secondary | ICD-10-CM | POA: Insufficient documentation

## 2022-10-11 DIAGNOSIS — Y9301 Activity, walking, marching and hiking: Secondary | ICD-10-CM | POA: Insufficient documentation

## 2022-10-11 DIAGNOSIS — I10 Essential (primary) hypertension: Secondary | ICD-10-CM | POA: Diagnosis not present

## 2022-10-11 DIAGNOSIS — N179 Acute kidney failure, unspecified: Secondary | ICD-10-CM | POA: Insufficient documentation

## 2022-10-11 DIAGNOSIS — I2510 Atherosclerotic heart disease of native coronary artery without angina pectoris: Secondary | ICD-10-CM | POA: Diagnosis not present

## 2022-10-11 DIAGNOSIS — Z7982 Long term (current) use of aspirin: Secondary | ICD-10-CM | POA: Insufficient documentation

## 2022-10-11 DIAGNOSIS — E039 Hypothyroidism, unspecified: Secondary | ICD-10-CM | POA: Insufficient documentation

## 2022-10-11 LAB — CBC WITH DIFFERENTIAL/PLATELET
Abs Immature Granulocytes: 0.08 10*3/uL — ABNORMAL HIGH (ref 0.00–0.07)
Basophils Absolute: 0 10*3/uL (ref 0.0–0.1)
Basophils Relative: 0 %
Eosinophils Absolute: 0.2 10*3/uL (ref 0.0–0.5)
Eosinophils Relative: 1 %
HCT: 35.2 % — ABNORMAL LOW (ref 39.0–52.0)
Hemoglobin: 11.6 g/dL — ABNORMAL LOW (ref 13.0–17.0)
Immature Granulocytes: 1 %
Lymphocytes Relative: 13 %
Lymphs Abs: 1.4 10*3/uL (ref 0.7–4.0)
MCH: 32 pg (ref 26.0–34.0)
MCHC: 33 g/dL (ref 30.0–36.0)
MCV: 97.2 fL (ref 80.0–100.0)
Monocytes Absolute: 0.7 10*3/uL (ref 0.1–1.0)
Monocytes Relative: 6 %
Neutro Abs: 8.6 10*3/uL — ABNORMAL HIGH (ref 1.7–7.7)
Neutrophils Relative %: 79 %
Platelets: 212 10*3/uL (ref 150–400)
RBC: 3.62 MIL/uL — ABNORMAL LOW (ref 4.22–5.81)
RDW: 12.6 % (ref 11.5–15.5)
WBC: 11 10*3/uL — ABNORMAL HIGH (ref 4.0–10.5)
nRBC: 0 % (ref 0.0–0.2)

## 2022-10-11 LAB — COMPREHENSIVE METABOLIC PANEL
ALT: 28 U/L (ref 0–44)
AST: 26 U/L (ref 15–41)
Albumin: 4 g/dL (ref 3.5–5.0)
Alkaline Phosphatase: 60 U/L (ref 38–126)
Anion gap: 9 (ref 5–15)
BUN: 44 mg/dL — ABNORMAL HIGH (ref 8–23)
CO2: 22 mmol/L (ref 22–32)
Calcium: 8.7 mg/dL — ABNORMAL LOW (ref 8.9–10.3)
Chloride: 103 mmol/L (ref 98–111)
Creatinine, Ser: 1.62 mg/dL — ABNORMAL HIGH (ref 0.61–1.24)
GFR, Estimated: 40 mL/min — ABNORMAL LOW (ref >60)
Glucose, Bld: 101 mg/dL — ABNORMAL HIGH (ref 70–99)
Potassium: 4.2 mmol/L (ref 3.5–5.1)
Sodium: 134 mmol/L — ABNORMAL LOW (ref 135–145)
Total Bilirubin: 0.8 mg/dL (ref 0.3–1.2)
Total Protein: 6.9 g/dL (ref 6.5–8.1)

## 2022-10-11 LAB — TROPONIN I (HIGH SENSITIVITY): Troponin I (High Sensitivity): 10 ng/L (ref <18)

## 2022-10-11 LAB — URINALYSIS, ROUTINE W REFLEX MICROSCOPIC
Bacteria, UA: NONE SEEN
Bilirubin Urine: NEGATIVE
Glucose, UA: NEGATIVE mg/dL
Ketones, ur: NEGATIVE mg/dL
Nitrite: NEGATIVE
Protein, ur: NEGATIVE mg/dL
Specific Gravity, Urine: 1.008 (ref 1.005–1.030)
Squamous Epithelial / HPF: NONE SEEN /HPF (ref 0–5)
pH: 5 (ref 5.0–8.0)

## 2022-10-11 NOTE — ED Triage Notes (Signed)
Pt presents to ER with c/o fall that happened a couple hours ago at home.  Pt states he was getting up from his chair, and while he was walking, slipped and fell backwards. Pt states he landed mostly on his right him, and also has skin tear to right elbow.  Pt reports hitting head, but denies any pain or LOC.  Pt does report some new weakness, but states it "hasn't bothered me much."  Pt is A&O x4 and in NAD om arrival.

## 2022-10-12 ENCOUNTER — Observation Stay
Admission: EM | Admit: 2022-10-12 | Discharge: 2022-10-14 | Disposition: A | Discharge status: Skilled Nursing Facility | Payer: Medicare Other | Attending: Internal Medicine | Admitting: Internal Medicine

## 2022-10-12 ENCOUNTER — Emergency Department: Payer: Medicare Other

## 2022-10-12 DIAGNOSIS — I2510 Atherosclerotic heart disease of native coronary artery without angina pectoris: Secondary | ICD-10-CM | POA: Diagnosis present

## 2022-10-12 DIAGNOSIS — N401 Enlarged prostate with lower urinary tract symptoms: Secondary | ICD-10-CM | POA: Diagnosis present

## 2022-10-12 DIAGNOSIS — Z87898 Personal history of other specified conditions: Secondary | ICD-10-CM

## 2022-10-12 DIAGNOSIS — S52501A Unspecified fracture of the lower end of right radius, initial encounter for closed fracture: Secondary | ICD-10-CM

## 2022-10-12 DIAGNOSIS — N179 Acute kidney failure, unspecified: Secondary | ICD-10-CM | POA: Diagnosis present

## 2022-10-12 DIAGNOSIS — E440 Moderate protein-calorie malnutrition: Secondary | ICD-10-CM | POA: Insufficient documentation

## 2022-10-12 DIAGNOSIS — D649 Anemia, unspecified: Secondary | ICD-10-CM | POA: Diagnosis present

## 2022-10-12 DIAGNOSIS — E860 Dehydration: Secondary | ICD-10-CM

## 2022-10-12 DIAGNOSIS — I10 Essential (primary) hypertension: Secondary | ICD-10-CM | POA: Diagnosis present

## 2022-10-12 DIAGNOSIS — E039 Hypothyroidism, unspecified: Secondary | ICD-10-CM | POA: Diagnosis present

## 2022-10-12 DIAGNOSIS — W19XXXA Unspecified fall, initial encounter: Secondary | ICD-10-CM

## 2022-10-12 DIAGNOSIS — S62101A Fracture of unspecified carpal bone, right wrist, initial encounter for closed fracture: Secondary | ICD-10-CM

## 2022-10-12 DIAGNOSIS — Y92009 Unspecified place in unspecified non-institutional (private) residence as the place of occurrence of the external cause: Secondary | ICD-10-CM

## 2022-10-12 LAB — TROPONIN I (HIGH SENSITIVITY): Troponin I (High Sensitivity): 10 ng/L (ref <18)

## 2022-10-12 LAB — CBC
HCT: 34.8 % — ABNORMAL LOW (ref 39.0–52.0)
Hemoglobin: 11.5 g/dL — ABNORMAL LOW (ref 13.0–17.0)
MCH: 31.4 pg (ref 26.0–34.0)
MCHC: 33 g/dL (ref 30.0–36.0)
MCV: 95.1 fL (ref 80.0–100.0)
Platelets: 192 10*3/uL (ref 150–400)
RBC: 3.66 MIL/uL — ABNORMAL LOW (ref 4.22–5.81)
RDW: 12.5 % (ref 11.5–15.5)
WBC: 12.6 10*3/uL — ABNORMAL HIGH (ref 4.0–10.5)
nRBC: 0 % (ref 0.0–0.2)

## 2022-10-12 LAB — TSH: TSH: 1.978 u[IU]/mL (ref 0.350–4.500)

## 2022-10-12 LAB — GLUCOSE, CAPILLARY: Glucose-Capillary: 110 mg/dL — ABNORMAL HIGH (ref 70–99)

## 2022-10-12 LAB — CREATININE, SERUM
Creatinine, Ser: 1.33 mg/dL — ABNORMAL HIGH (ref 0.61–1.24)
GFR, Estimated: 51 mL/min — ABNORMAL LOW (ref >60)

## 2022-10-12 MED ORDER — SODIUM CHLORIDE 0.9 % IV SOLN: INTRAVENOUS | Status: AC

## 2022-10-12 MED ORDER — ENOXAPARIN SODIUM 30 MG/0.3ML IJ SOSY
30.0000 mg | PREFILLED_SYRINGE | INTRAMUSCULAR | Status: DC
  Administered 2022-10-12: 30 mg via SUBCUTANEOUS
  Filled 2022-10-12 (×2): qty 0.3

## 2022-10-12 MED ORDER — LEVOTHYROXINE SODIUM 50 MCG PO TABS
175.0000 ug | ORAL_TABLET | Freq: Every day | ORAL | Status: DC
  Administered 2022-10-12 – 2022-10-14 (×3): 175 ug via ORAL
  Filled 2022-10-12 (×4): qty 1

## 2022-10-12 MED ORDER — ADULT MULTIVITAMIN W/MINERALS CH
1.0000 | ORAL_TABLET | Freq: Every day | ORAL | Status: DC
  Administered 2022-10-13 – 2022-10-14 (×2): 1 via ORAL
  Filled 2022-10-12 (×2): qty 1

## 2022-10-12 MED ORDER — SODIUM CHLORIDE 0.9 % IV BOLUS
1000.0000 mL | Freq: Once | INTRAVENOUS | Status: AC
  Administered 2022-10-12: 1000 mL via INTRAVENOUS

## 2022-10-12 MED ORDER — ACETAMINOPHEN 325 MG PO TABS: 650.0000 mg | ORAL_TABLET | Freq: Four times a day (QID) | ORAL | Status: DC | PRN

## 2022-10-12 MED ORDER — FERROUS SULFATE 325 (65 FE) MG PO TABS
325.0000 mg | ORAL_TABLET | Freq: Every day | ORAL | Status: DC
  Administered 2022-10-12 – 2022-10-14 (×3): 325 mg via ORAL
  Filled 2022-10-12 (×3): qty 1

## 2022-10-12 MED ORDER — ACETAMINOPHEN 650 MG RE SUPP: 650.0000 mg | Freq: Four times a day (QID) | RECTAL | Status: DC | PRN

## 2022-10-12 MED ORDER — GLUCERNA SHAKE PO LIQD
237.0000 mL | Freq: Three times a day (TID) | ORAL | Status: DC
  Administered 2022-10-12 – 2022-10-14 (×5): 237 mL via ORAL

## 2022-10-12 MED ORDER — HYDROCODONE-ACETAMINOPHEN 5-325 MG PO TABS: 1.0000 | ORAL_TABLET | ORAL | Status: DC | PRN

## 2022-10-12 MED ORDER — ONDANSETRON HCL 4 MG/2ML IJ SOLN: 4.0000 mg | Freq: Four times a day (QID) | INTRAMUSCULAR | Status: DC | PRN

## 2022-10-12 MED ORDER — AMLODIPINE BESYLATE 10 MG PO TABS
10.0000 mg | ORAL_TABLET | Freq: Every day | ORAL | Status: DC
  Administered 2022-10-12 – 2022-10-14 (×3): 10 mg via ORAL
  Filled 2022-10-12 (×3): qty 1

## 2022-10-12 MED ORDER — FINASTERIDE 5 MG PO TABS
5.0000 mg | ORAL_TABLET | Freq: Every day | ORAL | Status: DC
  Administered 2022-10-12 – 2022-10-14 (×3): 5 mg via ORAL
  Filled 2022-10-12 (×3): qty 1

## 2022-10-12 MED ORDER — MIRABEGRON ER 25 MG PO TB24
25.0000 mg | ORAL_TABLET | Freq: Every day | ORAL | Status: DC
  Administered 2022-10-12: 25 mg via ORAL
  Filled 2022-10-12 (×2): qty 1

## 2022-10-12 MED ORDER — LISINOPRIL 20 MG PO TABS
20.0000 mg | ORAL_TABLET | Freq: Every day | ORAL | Status: DC
  Administered 2022-10-12 – 2022-10-14 (×3): 20 mg via ORAL
  Filled 2022-10-12 (×3): qty 1

## 2022-10-12 MED ORDER — ASPIRIN 81 MG PO TBEC
81.0000 mg | DELAYED_RELEASE_TABLET | Freq: Every day | ORAL | Status: DC
  Administered 2022-10-12 – 2022-10-14 (×3): 81 mg via ORAL
  Filled 2022-10-12 (×3): qty 1

## 2022-10-12 MED ORDER — ONDANSETRON HCL 4 MG PO TABS: 4.0000 mg | ORAL_TABLET | Freq: Four times a day (QID) | ORAL | Status: DC | PRN

## 2022-10-12 NOTE — Evaluation (Signed)
Occupational Therapy Evaluation Patient Details Name: Richard Castaneda MRN: [id number] DOB: [birth year] Today's Date: 10/12/2022   History of Present Illness Pt is a 89 y/o M who presents with a broken R radius following a fall at home, all other imaging negative. PMH significant for HTN, HLD, BPH, MI, DM, CAD, PVD, history of falls, and L TKA.   Clinical Impression   Pt was seen for OT evaluation this date. Pt/OT co-evaluation to maximize pt/therapist safety/function. Prior to hospital admission, pt was mod indep with mobility and ADL. Son and daughter assisted with groceries, household tasks, and transportation. Pt presents to acute OT demonstrating impaired ADL performance and functional mobility 2/2 decreased strength, R wrist NWB in splint, impaired balance, and activity tolerance (See OT problem list for additional functional deficits). Pt currently requires MIN-MOD A for LB ADL tasks, MIN A for grooming (2/2 RUE NWB) Pt able to perform bed mobility with modA for scooting to EOB. Pt able to transfer c/ platform walker and minA+2 for physical assistance and ambulating c/ platform walker and minA for AD navigation. Pt would benefit from skilled OT services to address noted impairments and functional limitations (see below for any additional details) in order to maximize safety and independence while minimizing falls risk and caregiver burden.    Recommendations for follow up therapy are one component of a multi-disciplinary discharge planning process, led by the attending physician.  Recommendations may be updated based on patient status, additional functional criteria and insurance authorization.   Assistance Recommended at Discharge Frequent or constant Supervision/Assistance  Patient can return home with the following A lot of help with walking and/or transfers;A lot of help with bathing/dressing/bathroom;Assistance with cooking/housework;Assist for transportation;Help with stairs or ramp for  entrance;Direct supervision/assist for financial management;Direct supervision/assist for medications management    Functional Status Assessment  Patient has had a recent decline in their functional status and demonstrates the ability to make significant improvements in function in a reasonable and predictable amount of time.  Equipment Recommendations  BSC/3in1    Recommendations for Other Services       Precautions / Restrictions Precautions Precautions: Fall Restrictions Weight Bearing Restrictions: Yes RUE Weight Bearing: Non weight bearing Other Position/Activity Restrictions: platform walker use allowed WB through elbow, verified with MD via secure chat      Mobility Bed Mobility Overal bed mobility: Needs Assistance Bed Mobility: Supine to Sit     Supine to sit: HOB elevated, Mod assist          Transfers Overall transfer level: Needs assistance Equipment used: Right platform walker Transfers: Sit to/from Stand Sit to Stand: Min assist, +2 physical assistance           General transfer comment: VC for hand placement      Balance Overall balance assessment: Needs assistance, History of Falls Sitting-balance support: Feet supported Sitting balance-Leahy Scale: Fair     Standing balance support: Bilateral upper extremity supported Standing balance-Leahy Scale: Fair                             ADL either performed or assessed with clinical judgement   ADL Overall ADL's : Needs assistance/impaired                                       General ADL Comments: Pt currently requires MIN-MOD A for LB ADL tasks,  MIN A for grooming (2/2 RUE NWB), and MIN A +2 for ADL transfers with RW.     Vision         Perception     Praxis      Pertinent Vitals/Pain Pain Assessment Pain Assessment: Faces Faces Pain Scale: Hurts a little bit Pain Location: R hip Pain Descriptors / Indicators: Aching, Discomfort, Dull Pain  Intervention(s): Limited activity within patient's tolerance, Monitored during session, Repositioned     Hand Dominance Left   Extremity/Trunk Assessment Upper Extremity Assessment Upper Extremity Assessment: Generalized weakness;RUE deficits/detail RUE Deficits / Details: R wrist splinted, sensation intact RUE: Unable to fully assess due to immobilization RUE Sensation: WNL   Lower Extremity Assessment Lower Extremity Assessment: Generalized weakness       Communication Communication Communication: No difficulties   Cognition Arousal/Alertness: Awake/alert Behavior During Therapy: WFL for tasks assessed/performed Overall Cognitive Status: Within Functional Limits for tasks assessed                                       General Comments       Exercises     Shoulder Instructions      Home Living Family/patient expects to be discharged to:: Private residence Living Arrangements: Alone Available Help at Discharge: Family;Available 24 hours/day Type of Home: House Home Access: Stairs to enter [employer] of Steps: 1 Entrance Stairs-Rails: None Home Layout: One level     Bathroom Shower/Tub: Producer, television/film/video: Standard Bathroom Accessibility: Yes   Home Equipment: Grab bars - tub/shower;Shower seat;Cane - single point;BSC/3in1          Prior Functioning/Environment Prior Level of Function : Independent/Modified Independent;History of Falls (last six months)             Mobility Comments: No AD at baseline ADLs Comments: Family assist with chores 1-2x/week, no assistance with showering,dressing        OT Problem List: Decreased strength;Pain;Decreased range of motion;Decreased activity tolerance;Impaired balance (sitting and/or standing);Decreased knowledge of use of DME or AE;Impaired UE functional use;Decreased knowledge of precautions      OT Treatment/Interventions: Self-care/ADL training;Therapeutic  exercise;Therapeutic activities;DME and/or AE instruction;Patient/family education;Balance training    OT Goals(Current goals can be found in the care plan section) Acute Rehab OT Goals Patient Stated Goal: get better and be able to walk again OT Goal Formulation: With patient/family Time For Goal Achievement: 10/26/22 Potential to Achieve Goals: Good ADL Goals Pt Will Perform Upper Body Dressing: sitting;with supervision;with set-up Pt Will Perform Lower Body Dressing: sit to/from stand;with mod assist Pt Will Transfer to Toilet: ambulating;with min assist (LRAD, NWBing R wrist throughout) Pt Will Perform Toileting - Clothing Manipulation and hygiene: with modified independence;sitting/lateral leans Additional ADL Goal #1: Pt will maintain RUE precautions with PRN VC during ADL/mobility tasks, 5/5 opportunities.  OT Frequency: Min 2X/week    Co-evaluation PT/OT/SLP Co-Evaluation/Treatment: Yes Reason for Co-Treatment: For patient/therapist safety;To address functional/ADL transfers PT goals addressed during session: Mobility/safety with mobility;Balance;Proper use of DME OT goals addressed during session: ADL's and self-care;Proper use of Adaptive equipment and DME;Strengthening/ROM      AM-PAC OT "6 Clicks" Daily Activity     Outcome Measure Help from another person eating meals?: A Little Help from another person taking care of personal grooming?: A Little Help from another person toileting, which includes using toliet, bedpan, or urinal?: A Lot Help from another person bathing (including  washing, rinsing, drying)?: A Lot Help from another person to put on and taking off regular upper body clothing?: A Little Help from another person to put on and taking off regular lower body clothing?: A Lot 6 Click Score: 15   End of Session Equipment Utilized During Treatment: Gait belt;Rolling walker (2 wheels) Nurse Communication: Mobility status  Activity Tolerance: Patient tolerated  treatment well Patient left: in chair;with call bell/phone within reach;with chair alarm set;with family/visitor present  OT Visit Diagnosis: Other abnormalities of gait and mobility (R26.89);Muscle weakness (generalized) (M62.81);Pain Pain - Right/Left: Right Pain - part of body: Hip                Time: 1347-1416 OT Time Calculation (min): 29 min Charges:  OT General Charges $OT Visit: 1 Visit OT Evaluation $OT Eval Moderate Complexity: 1 Mod  Arman Filter., MPH, MS, OTR/L ascom [phone] 10/12/22, 5:17 PM

## 2022-10-12 NOTE — Evaluation (Addendum)
Physical Therapy Evaluation Patient Details Name: Richard Castaneda MRN: [id number] DOB: 1929/06/11 Today's Date: 10/12/2022  History of Present Illness  Pt is a 89 y/o M who presents with a broken R radius following a fall at home, all other imaging negative. PMH significant for HTN, HLD, BPH, MI, DM, CAD, PVD, history of falls, and L TKA.  Clinical Impression  Pt is upright in bed with son and son-in-law present, A&Ox4. Pt/OT co-evaluation to maximize pt/therapist safety/function. Pt reports mild right hip pain. Pt is WB through elbow only on platform walker per MD. PTA pt was modI for mobility and ADL's, with son and daughter helping with groceries, chores, and transportation. Pts family reports 1 fall in the past 6 months, pt no longer drives. Pts family able to verify home living situation and PLOF, pt exhibits delayed recall. Pt able to perform bed mobility with modA for scooting to EOB. Pt able to transfer c/ platform walker and minA+2 for physical assistance and ambulating c/ platform walker and minA for AD navigation. Pt left in chair with alarm set, needs within reach, and family present in room. Pt would benefit from skilled PT interventions to improve independence, functional capacity tolerance, and ambulation.      Recommendations for follow up therapy are one component of a multi-disciplinary discharge planning process, led by the attending physician.  Recommendations may be updated based on patient status, additional functional criteria and insurance authorization.  Follow Up Recommendations Can patient physically be transported by private vehicle: Yes     Assistance Recommended at Discharge Frequent or constant Supervision/Assistance  Patient can return home with the following  Assistance with cooking/housework;Assist for transportation;Assistance with feeding;Help with stairs or ramp for entrance;Direct supervision/assist for medications management;A little help with walking and/or  transfers;A lot of help with bathing/dressing/bathroom    Equipment Recommendations    Recommendations for Other Services       Functional Status Assessment Patient has had a recent decline in their functional status and demonstrates the ability to make significant improvements in function in a reasonable and predictable amount of time.     Precautions / Restrictions Precautions Precautions: Fall Restrictions Weight Bearing Restrictions: Yes RUE Weight Bearing: Non weight bearing Other Position/Activity Restrictions: platform walker use allowed WB through elbow, verified with MD via secure chat      Mobility  Bed Mobility Overal bed mobility: Needs Assistance Bed Mobility: Supine to Sit     Supine to sit: HOB elevated, Mod assist          Transfers Overall transfer level: Needs assistance Equipment used: Right platform walker Transfers: Sit to/from Stand Sit to Stand: Min assist, +2 physical assistance                Ambulation/Gait Ambulation/Gait assistance: Min assist Gait Distance (Feet): 40 Feet Assistive device: Right platform walker Gait Pattern/deviations: Decreased stride length, Narrow base of support, Drifts right/left          Stairs            Wheelchair Mobility    Modified Rankin (Stroke Patients Only)       Balance Overall balance assessment: Needs assistance, History of Falls Sitting-balance support: Feet supported Sitting balance-Leahy Scale: Fair     Standing balance support: Bilateral upper extremity supported Standing balance-Leahy Scale: Fair                               Pertinent Vitals/Pain Pain  Assessment Pain Assessment: Faces Faces Pain Scale: Hurts a little bit Pain Location: R hip Pain Descriptors / Indicators: Aching, Discomfort, Dull Pain Intervention(s): Limited activity within patient's tolerance, Monitored during session, Repositioned    Home Living Family/patient expects to be  discharged to:: Private residence Living Arrangements: Alone Available Help at Discharge: Family;Available 24 hours/day Type of Home: House Home Access: Stairs to enter Entrance Stairs-Rails: None Entrance Stairs-Number of Steps: 1   Home Layout: One level Home Equipment: Grab bars - tub/shower;Shower seat;Cane - single point;BSC/3in1      Prior Function Prior Level of Function : Independent/Modified Independent;History of Falls (last six months)             Mobility Comments: No AD at baseline ADLs Comments: Family assist with chores 1-2x/week, no assistance with showering,dressing     Hand Dominance   Dominant Hand: Left    Extremity/Trunk Assessment   Upper Extremity Assessment Upper Extremity Assessment: Generalized weakness    Lower Extremity Assessment Lower Extremity Assessment: Generalized weakness       Communication   Communication: No difficulties  Cognition Arousal/Alertness: Awake/alert Behavior During Therapy: WFL for tasks assessed/performed Overall Cognitive Status: Within Functional Limits for tasks assessed                                          General Comments      Exercises     Assessment/Plan    PT Assessment Patient needs continued PT services  PT Problem List Decreased strength;Decreased range of motion;Decreased knowledge of use of DME;Decreased activity tolerance;Decreased safety awareness;Decreased balance;Decreased mobility;Decreased knowledge of precautions;Decreased coordination;Decreased cognition       PT Treatment Interventions DME instruction;Balance training;Gait training;Neuromuscular re-education;Stair training;Functional mobility training;Therapeutic activities;Therapeutic exercise;Patient/family education    PT Goals (Current goals can be found in the Care Plan section)  Acute Rehab PT Goals Patient Stated Goal: to return to home Time For Goal Achievement: 10/26/22 Potential to Achieve Goals:  Good    Frequency Min 3X/week     Co-evaluation PT/OT/SLP Co-Evaluation/Treatment: Yes Reason for Co-Treatment: For patient/therapist safety;To address functional/ADL transfers PT goals addressed during session: Mobility/safety with mobility;Balance;Proper use of DME OT goals addressed during session: ADL's and self-care;Proper use of Adaptive equipment and DME;Strengthening/ROM       AM-PAC PT "6 Clicks" Mobility  Outcome Measure Help needed turning from your back to your side while in a flat bed without using bedrails?: A Little Help needed moving from lying on your back to sitting on the side of a flat bed without using bedrails?: A Lot Help needed moving to and from a bed to a chair (including a wheelchair)?: A Little Help needed standing up from a chair using your arms (e.g., wheelchair or bedside chair)?: A Little Help needed to walk in hospital room?: A Little Help needed climbing 3-5 steps with a railing? : A Lot 6 Click Score: 16    End of Session Equipment Utilized During Treatment: Gait belt Activity Tolerance: Patient tolerated treatment well Patient left: in chair;with chair alarm set;with call bell/phone within reach;Other (comment) (family in room) Nurse Communication: Mobility status PT Visit Diagnosis: Unsteadiness on feet (R26.81);History of falling (Z91.81);Muscle weakness (generalized) (M62.81)    Time: 6213-0865 PT Time Calculation (min) (ACUTE ONLY): 30 min   Charges:   PT Evaluation $PT Eval Low Complexity: 1 Low PT Treatments $Gait Training: 8-22 mins  Lala Lund, PT, SPT  10/12/2022, 3:46 PM

## 2022-10-12 NOTE — ED Provider Notes (Signed)
[facility] Provider Note    Event Date/Time   First MD Initiated Contact with Patient 10/12/22 [phone]     (approximate)   History   Fall   HPI  Richard Castaneda is a 89 y.o. male past medical history significant for hypertension, hyperlipidemia, BPH, who presents to the emergency department following a fall.  Patient [name] is generalized weakness and fatigue over the past 3 to 4 days.  Went to step up 1 step at home and fell backwards hitting his head and landing on his right side of the body.  Not on any anticoagulation.  Generalized weakness over the past couple of days.  Decreased p.o. intake and decreased urine output.  Currently taking medications for BPH, is uncertain if he is on a new medication 2 weeks ago with urology.  Denies dysuria, urinary urgency or frequency.  Denies any chest pain.  Complaining of pain to his right wrist.     Physical Exam   Triage Vital Signs: ED Triage Vitals  Enc Vitals Group     BP 10/11/22 2242 138/68     Pulse Rate 10/11/22 2242 79     Resp 10/11/22 2242 18     Temp 10/11/22 2242 97.9 F (36.6 C)     Temp Source 10/11/22 2242 Oral     SpO2 10/11/22 2242 94 %     Weight --      Height --      Head Circumference --      Peak Flow --      Pain Score 10/11/22 2243 3     Pain Loc --      Pain Edu? --      Excl. in GC? --     Most recent vital signs: Vitals:   10/11/22 2242  BP: 138/68  Pulse: 79  Resp: 18  Temp: 97.9 F (36.6 C)  SpO2: 94%    Physical Exam Constitutional:      Appearance: He is well-developed.  HENT:     Head: Atraumatic.  Eyes:     Conjunctiva/sclera: Conjunctivae normal.  Cardiovascular:     Rate and Rhythm: Regular rhythm.  Pulmonary:     Effort: No respiratory distress.  Abdominal:     Tenderness: There is no abdominal tenderness.  Musculoskeletal:        General: Deformity present.     Right elbow: Tenderness present.     Right forearm: Laceration (Skin tear to the right  forearm, hemostatic) present.     Right wrist: Swelling, deformity and tenderness present.     Cervical back: Normal range of motion. Tenderness present.  Skin:    General: Skin is warm.  Neurological:     Mental Status: He is alert. Mental status is at baseline.     IMPRESSION / MDM / ASSESSMENT AND PLAN / ED COURSE  I reviewed the triage vital signs and the nursing notes.  Differential diagnosis including infectious process, dehydration [place] urinary tract infection, intracranial hemorrhage, fracture, dislocation  On chart review from last urology note patient has been taking Vibegron.   On arrival afebrile, hemodynamically stable  EKG  I, Corena Herter, the attending physician, personally viewed and interpreted this ECG.   Rate: Normal  Rhythm: Normal sinus  Axis: Normal  Intervals: Normal  ST&T Change: None No change when compared to prior  No tachycardic or bradycardic dysrhythmias while on cardiac telemetry.  RADIOLOGY I independently reviewed imaging, my interpretation of imaging: CT scan of the  head without signs of intracranial hemorrhage or infarction.  Read as no acute findings.  CT scan of the cervical spine with no acute fracture or dislocation  X-ray of the right wrist with acute impacted fracture to the distal right radius.    Chest x-ray with no acute fracture or dislocation.  No signs of pneumonia.  Read as no acute findings.  X-ray of the right hip with no acute fracture or dislocation.  Read as no acute findings.  X-ray of the lumbar spine with no acute fracture or dislocation.  Read as no acute findings  X-ray of lumbar spine with no acute fracture or dislocation -height loss to lower lumbar spine.  Read as no acute fracture.  LABS (all labs ordered are listed, but only abnormal results are displayed) Labs interpreted as -    Labs Reviewed  CBC WITH DIFFERENTIAL/PLATELET - Abnormal; Notable for the following components:      Result Value    WBC 11.0 (*)    RBC 3.62 (*)    Hemoglobin 11.6 (*)    HCT 35.2 (*)    Neutro Abs 8.6 (*)    Abs Immature Granulocytes 0.08 (*)    All other components within normal limits  COMPREHENSIVE METABOLIC PANEL - Abnormal; Notable for the following components:   Sodium 134 (*)    Glucose, Bld 101 (*)    BUN 44 (*)    Creatinine, Ser 1.62 (*)    Calcium 8.7 (*)    GFR, Estimated 40 (*)    All other components within normal limits  URINALYSIS, ROUTINE W REFLEX MICROSCOPIC - Abnormal; Notable for the following components:   Color, Urine STRAW (*)    APPearance CLEAR (*)    Hgb urine dipstick SMALL (*)    Leukocytes,Ua TRACE (*)    All other components within normal limits  TROPONIN I (HIGH SENSITIVITY)  TROPONIN I (HIGH SENSITIVITY)     MDM  Patient found to have acute kidney injury, appears prerenal.  No signs of urinary tract infection.  No obvious source of an infectious process.  Troponin negative, have a low suspicion for ACS.  Likely dehydration secondary to medication side effects and decreased oral intake.  Given 1 L of IV fluids and on reevaluation continues to have weakness and unable to ambulate at his baseline.  Normally ambulates with a cane.  Splint placed to the right forearm.  Consulted hospitalist for admission for acute kidney injury, generalized weakness and dehydration.  Discussed patient's case with Dr. Para March.     PROCEDURES:  Critical Care performed: No  .Splint Application  Date/Time: 10/12/2022 3:23 AM  Performed by: Corena Herter, MD Authorized by: Corena Herter, MD   Consent:    Consent obtained:  Verbal   Consent given by:  Patient   Risks, benefits, and alternatives were discussed: yes     Risks discussed:  Discoloration, numbness, pain and swelling   Alternatives discussed:  Delayed treatment Universal protocol:    Procedure explained and questions answered to patient or proxy's satisfaction: yes     Relevant documents present and verified:  yes     Test results available: yes     Imaging studies available: yes     Required blood products, implants, devices, and special equipment available: yes     Site/side marked: yes     Immediately prior to procedure a time out was called: yes     Patient identity confirmed:  Verbally with patient, arm band and hospital-assigned identification  number Pre-procedure details:    Distal neurologic exam:  Normal   Distal perfusion: distal pulses strong   Procedure details:    Location:  Wrist   Wrist location:  R wrist   Cast type:  Short arm   Splint type:  Sugar tong   Supplies:  Fiberglass   Attestation: Splint applied and adjusted personally by me   Post-procedure details:    Distal neurologic exam:  Normal   Distal perfusion: distal pulses strong     Procedure completion:  Tolerated well, no immediate complications   Patient's presentation is most consistent with acute presentation with potential threat to life or bodily function.   MEDICATIONS ORDERED IN ED: Medications  sodium chloride 0.9 % bolus 1,000 mL (1,000 mLs Intravenous New Bag/Given 10/12/22 0230)    FINAL CLINICAL IMPRESSION(S) / ED DIAGNOSES   Final diagnoses:  Fall, initial encounter  Closed fracture of distal end of right radius, unspecified fracture morphology, initial encounter  AKI (acute kidney injury) (HCC)  Dehydration     Rx / DC Orders   ED Discharge Orders     None        Note:  This document was prepared using Dragon voice recognition software and may include unintentional dictation errors.   Corena Herter, MD 10/12/22 [phone]

## 2022-10-12 NOTE — Consult Note (Signed)
ORTHOPAEDIC CONSULTATION  REQUESTING PHYSICIAN: Loyce Dys, MD  Chief Complaint:   R wrist pain  History of Present Illness: Richard Castaneda is a 89 y.o. male who had a fall While he was going up 1 step and missed a step.  He feels that he jammed his right wrist when he attempted to brace his fall.  He lives at home alone at baseline.  He uses a cane for ambulation.  He was admitted due to acute kidney injury.  Radiographs in the emergency department showed a minimally displaced right distal radius fracture.  He was placed in a sugar-tong splint in the emergency department.  Patient is left-handed.  Past Medical History:  Diagnosis Date   Acquired hypothyroidism 11/16/2016   Anterior myocardial infarction (HCC) 12/29/2013   Overview:  2004   Benign essential HTN 12/29/2013   Benign prostatic hyperplasia with urinary frequency 10/19/2016   Borderline diabetes mellitus 11/16/2016   Coronary artery disease 12/29/2013   Overview:  Anterior MI, PCI and stent placement of LAD11/04   H/O iron deficiency anemia 09/09/2015   History of BPH    Inguinal hernia, bilateral    Moderate tricuspid insufficiency 09/12/2014   Occasional tremors    Left hand only with writing and eating   Pre-diabetes    Primary osteoarthritis of left knee 08/26/2016   Pure hypercholesterolemia 12/29/2013   PVD (peripheral vascular disease) (HCC) 12/29/2013   Overview:  With carotid atherosclerosis    Past Surgical History:  Procedure Laterality Date   CATARACT EXTRACTION W/ INTRAOCULAR LENS  IMPLANT, BILATERAL     CORONARY ANGIOPLASTY  2004   1 stent   EYE SURGERY Bilateral    Cataract Extraction with IOL   HERNIA REPAIR Bilateral    Inguinal Hernia Repair   PARTIAL KNEE ARTHROPLASTY Left 12/31/2016   Procedure: UNICOMPARTMENTAL KNEE;  Surgeon: Christena Flake, MD;  Location: [facility] ORS;  Service: Orthopedics;  Laterality: Left;   TOTAL KNEE  REVISION Left 06/22/2017   Procedure: TOTAL KNEE REVISION, CONVERTING A PARTIAL KNEE TO A TOTAL;  Surgeon: Christena Flake, MD;  Location: [facility] ORS;  Service: Orthopedics;  Laterality: Left;   Social History   Socioeconomic History   Marital status: Widowed    Spouse name: Not on file   Number of children: Not on file   Years of education: Not on file   Highest education level: Not on file  Occupational History   Not on file  Tobacco Use   Smoking status: Never   Smokeless tobacco: Never  Vaping Use   Vaping Use: Never used  Substance and Sexual Activity   Alcohol use: No   Drug use: No   Sexual activity: Not on file  Other Topics Concern   Not on file  Social History Narrative   Not on file   Social Determinants of Health   Financial Resource Strain: Not on file  Food Insecurity: Not on file  Transportation Needs: Not on file  Physical Activity: Not on file  Stress: Not on file  Social Connections: Not on file   Family History  Problem Relation Age of Onset   Kidney failure Mother    Prostate cancer Paternal Uncle    Bladder Cancer Neg Hx    Kidney cancer Neg Hx    Allergies  Allergen Reactions   Terazosin     Other reaction(s): Orthostatic hypotension   Prior to Admission medications   Medication Sig Start Date End Date Taking? Authorizing Provider  acetaminophen (TYLENOL)  650 MG CR tablet Take 650 mg by mouth 3 (three) times daily.   Yes [provider]  amLODipine (NORVASC) 10 MG tablet Take 10 mg by mouth daily. 07/29/22  Yes [provider]  aspirin EC 81 MG tablet Take 81 mg by mouth daily.    Yes [provider]  cholecalciferol (VITAMIN D3) 25 MCG (1000 UNIT) tablet Take 400 Units by mouth daily with supper.   Yes [provider]  Docusate Sodium (DSS) 100 MG CAPS Take 100 mg by mouth daily as needed.   Yes [provider]  ferrous sulfate 325 (65 FE) MG tablet Take 325 mg by mouth daily with breakfast.   Yes  [provider]  finasteride (PROSCAR) 5 MG tablet TAKE 1 TABLET (5 MG TOTAL) BY MOUTH ONCE DAILY. 07/10/21  Yes McGowan, Carollee Herter A, PA-C  levothyroxine (SYNTHROID) 175 MCG tablet Take 175 mcg by mouth daily. 07/14/21  Yes [provider]  lisinopril (ZESTRIL) 20 MG tablet Take 20 mg by mouth daily before breakfast. 10/27/16  Yes [provider]  Povidone, PF, (IVIZIA DRY EYES) 0.5 % SOLN Apply 1 drop to eye daily.   Yes [provider]  simvastatin (ZOCOR) 10 MG tablet Take 10 mg by mouth daily.   Yes [provider]  Vibegron (GEMTESA) 75 MG TABS Take 1 tablet (75 mg total) by mouth daily. 09/30/22  Yes Vaillancourt, Samantha, PA-C  Misc Natural Products (OSTEO BI-FLEX ADV TRIPLE ST) TABS Take 1 tablet by mouth 2 (two) times daily. Patient not taking: Reported on 10/12/2022    [provider]  Omega-3 Fatty Acids (FISH OIL) 1200 MG CAPS Take 1,200 mg by mouth daily with breakfast.  Patient not taking: Reported on 10/12/2022    [provider]  simvastatin (ZOCOR) 20 MG tablet Take 20 mg by mouth daily at 8 pm. (2000) Patient not taking: Reported on 10/12/2022    [provider]  tetrahydrozoline 0.05 % ophthalmic solution Place 1 drop into both eyes daily. Visine Clear Patient not taking: Reported on 10/12/2022    [provider]   Recent Labs    10/11/22 2245 10/12/22 0616  WBC 11.0* 12.6*  HGB 11.6* 11.5*  HCT 35.2* 34.8*  PLT 212 192  K 4.2  --   CL 103  --   CO2 22  --   BUN 44*  --   CREATININE 1.62* 1.33*  GLUCOSE 101*  --   CALCIUM 8.7*  --    CT Cervical Spine Wo Contrast  Result Date: 10/12/2022 CLINICAL DATA:  Neck trauma (Age >= 65y), fall EXAM: CT CERVICAL SPINE WITHOUT CONTRAST TECHNIQUE: Multidetector CT imaging of the cervical spine was performed without intravenous contrast. Multiplanar CT image reconstructions were also generated. RADIATION DOSE REDUCTION: This exam was performed according to  the departmental dose-optimization program which includes automated exposure control, adjustment of the mA and/or kV according to patient size and/or use of iterative reconstruction technique. COMPARISON:  None Available. FINDINGS: Alignment: Normal. Skull base and vertebrae: Craniocervical alignment is normal. The atlantodental interval is not widened. No acute fracture of the cervical spine. Vertebral body height is preserved. Soft tissues and spinal canal: No prevertebral fluid or swelling. No visible canal hematoma. Disc levels: Mild disc space narrowing and endplate remodeling is seen at C4-C6 in keeping with changes of mild degenerative disc disease. Prevertebral soft tissues are not thickened. Spinal canal is widely patent. No high-grade neuroforaminal narrowing. Upper chest: Negative. Other: None IMPRESSION: 1. No  acute fracture or listhesis of the cervical spine. Electronically Signed   By: Helyn Numbers M.D.   On: 10/12/2022 02:29   DG Chest 2 View  Result Date: 10/12/2022 CLINICAL DATA:  Fall EXAM: CHEST - 2 VIEW COMPARISON:  None Available. FINDINGS: Lungs are well expanded, symmetric, and clear. No pneumothorax or pleural effusion. Cardiac size within normal limits. Pulmonary vascularity is normal. Osseous structures are age-appropriate. No acute bone abnormality. IMPRESSION: No active cardiopulmonary disease. Electronically Signed   By: Helyn Numbers M.D.   On: 10/12/2022 02:26   DG Lumbar Spine 2-3 Views  Result Date: 10/12/2022 CLINICAL DATA:  Fall, back pain EXAM: LUMBAR SPINE - 2-3 VIEW COMPARISON:  None Available. FINDINGS: Intervertebral disc spaces are not optimally profiled on lateral examination. 7 mm retrolisthesis L4-5 noted, likely degenerative in nature. There is overall straightening of the lumbar spine which may relate to a underlying muscular spasm. Intervertebral disc space narrowing and endplate remodeling at L4-5 and L5-S1 in keeping with changes of advanced degenerative  disc disease. No acute fracture or traumatic listhesis of the lumbar spine. Vascular calcifications are noted within the aortoiliac vasculature. IMPRESSION: 1. No acute fracture or traumatic listhesis. 2. Advanced degenerative disc disease L4-5 and L5-S1. Electronically Signed   By: Helyn Numbers M.D.   On: 10/12/2022 02:24   DG Wrist Complete Right  Result Date: 10/12/2022 CLINICAL DATA:  Fall EXAM: RIGHT WRIST - COMPLETE 3+ VIEW COMPARISON:  None Available. FINDINGS: There is an acute, transverse, impacted fracture of the distal right radial metaphysis with resultant slight dorsal tilt of the distal radial articular surface. Radiocarpal articulation is preserved. No other fracture identified. Osseous structures are diffusely osteopenic. Degenerative changes are seen throughout the right thumb. IMPRESSION: 1. Acute, transverse, impacted fracture of the distal right radial metaphysis. Electronically Signed   By: Helyn Numbers M.D.   On: 10/12/2022 02:22   DG HIP UNILAT WITH PELVIS 2-3 VIEWS RIGHT  Result Date: 10/11/2022 CLINICAL DATA:  Hip pain after fall. EXAM: DG HIP (WITH OR WITHOUT PELVIS) 2-3V RIGHT COMPARISON:  None Available. FINDINGS: No acute fracture. Femoral head is normally located, no dislocation. Mild bilateral hip osteoarthritis. Pubic rami are intact. Pubic symphysis and sacroiliac joints are congruent. IMPRESSION: No fracture of the pelvis or right hip. Electronically Signed   By: Narda Rutherford M.D.   On: 10/11/2022 23:33   DG Elbow Complete Right  Result Date: 10/11/2022 CLINICAL DATA:  Fall, elbow pain. EXAM: RIGHT ELBOW - COMPLETE 3+ VIEW COMPARISON:  None Available. FINDINGS: There is no evidence of fracture, dislocation, or joint effusion. Minor degenerative change. There is an olecranon spur. Soft tissues are unremarkable. IMPRESSION: 1. No fracture or dislocation of the right elbow. 2. Mild degenerative change and olecranon spur. Electronically Signed   By: Narda Rutherford  M.D.   On: 10/11/2022 23:32   CT HEAD WO CONTRAST ( )  Result Date: 10/11/2022 CLINICAL DATA:  Fall at home. EXAM: CT HEAD WITHOUT CONTRAST TECHNIQUE: Contiguous axial images were obtained from the base of the skull through the vertex without intravenous contrast. RADIATION DOSE REDUCTION: This exam was performed according to the departmental dose-optimization program which includes automated exposure control, adjustment of the mA and/or kV according to patient size and/or use of iterative reconstruction technique. COMPARISON:  MRI head 08/15/2021 and CT head 08/15/2021 FINDINGS: Brain: No intracranial hemorrhage, mass effect, or evidence of acute infarct. No hydrocephalus. No extra-axial fluid collection. Generalized cerebral atrophy. Ill-defined hypoattenuation within the cerebral white matter is  nonspecific but consistent with chronic small vessel ischemic disease. Vascular: No hyperdense vessel. Intracranial arterial calcification. Skull: No fracture or focal lesion. Sinuses/Orbits: No acute finding. Paranasal sinuses and mastoid air cells are well aerated. Other: None. IMPRESSION: 1. No evidence of acute intracranial abnormality. 2. Generalized cerebral atrophy and chronic small vessel ischemic disease. Electronically Signed   By: Minerva Fester M.D.   On: 10/11/2022 23:10     Positive ROS: All other systems have been reviewed and were otherwise negative with the exception of those mentioned in the HPI and as above.  Physical Exam: BP (!) 123/59   Pulse 70   Temp 98.9 F (37.2 C)   Resp 16   Ht 5\' 6"  (1.676 m)   Wt 68 kg   SpO2 94%   BMI 24.20 kg/m  General:  Alert, no acute distress Psychiatric:  Patient is competent for consent with normal mood and affect    Orthopedic Exam:  RUE: +ain/pin/u motor SILT over fingers Fingers are warm and well-perfused. Sugar-tong splint in place   X-rays:  As above: Minimally displaced distal radius fracture  Assessment/Plan: 89 year old  male who sustained a minimally displaced distal radius fracture. 1.  We discussed the findings.  Given the current fracture pattern, we can plan to treat this fracture nonsurgically.  Plan to maintain splint until outpatient follow-up in 1-2 weeks.  2.  Recommend nonweightbearing through the wrist.  He may bear weight through the elbow and shoulder to help assist with using a platform walker.  3.  PT/OT for mobilization.  4.  Will plan to follow peripherally as an inpatient.  Please page with any further questions.  He may follow-up with [facility] clinic orthopedics in 1-2 weeks.    Signa Kell   10/12/2022 3:41 PM

## 2022-10-12 NOTE — Assessment & Plan Note (Addendum)
Fall at home, initial encounter History of fall related to syncope 08/2021 Syncope not suspected. Wrist splinted in the ED Per Orthopedic Surgery (Dr. Eliane Decree, [facility]) -- follow up in 1-2 weeks. No weight bearing through right wrist Pain control with Tylenol PRN PT/OT recommend SNF/rehab

## 2022-10-12 NOTE — Assessment & Plan Note (Signed)
No complaints of chest pain, EKG nonacute on first troponin 10 Continue aspirin, simvastatin and lisinopril

## 2022-10-12 NOTE — Assessment & Plan Note (Addendum)
Hemoglobin at baseline Repeat CBC in 1-2 weeks at follow up Pending Vitamin B12 level (for evaluation of bilateral leg weakness and falls)

## 2022-10-12 NOTE — Assessment & Plan Note (Addendum)
Will check TSH, given fall and history of syncope Continue levothyroxine 175

## 2022-10-12 NOTE — Progress Notes (Addendum)
Initial Nutrition Assessment  DOCUMENTATION CODES:   Non-severe (moderate) malnutrition in context of chronic illness  INTERVENTION:   Glucerna Shake po TID, each supplement provides 220 kcal and 10 grams of protein  MVI po daily   Pt at refeed risk; recommend monitor potassium, magnesium and phosphorus labs daily until stable  Daily weights   NUTRITION DIAGNOSIS:   Moderate Malnutrition related to social / environmental circumstances (advanced age) as evidenced by mild fat depletion, mild muscle depletion.  GOAL:   Patient will meet greater than or equal to 90% of their needs  MONITOR:   PO intake, Supplement acceptance, Weight trends, Labs, I & O's, Skin  REASON FOR ASSESSMENT:   Consult Assessment of nutrition requirement/status  ASSESSMENT:   89 y/o male with h/o CAD, hypothyroidism, HTN, BPH, PVD, MI, CKD III, IDA and b/l inguinal hernias who is admitted with fall, AKI and wrist fracture.  Met with pt and son in room today. Pt reports good appetite and oral intake at baseline and in hospital; pt is documented to be eating 100% of meals. Per chart, pt appears weight stable at baseline. RD discussed with pt the importance of adequate nutrition needed to preserve lean muscle and support healing. Pt is willing to drink chocolate supplements in hospital but requests one with "no sugar". RD will add Glucerna but pt may also have Ensure if he prefers. RD will also add daily MVI and liberalize pt's diet.    Medications reviewed and include: aspirin, lovenox, ferrous sulfate, synthroid  Labs reviewed: creat 1.3(H) Na 134(L), K 4.2 wnl- 6/9 Wbc- 12.6(H)  NUTRITION - FOCUSED PHYSICAL EXAM:  Flowsheet Row Most Recent Value  Orbital Region Mild depletion  Upper Arm Region Moderate depletion  Thoracic and Lumbar Region Mild depletion  Buccal Region Mild depletion  Temple Region Mild depletion  Clavicle Bone Region Mild depletion  Clavicle and Acromion Bone Region Mild  depletion  Scapular Bone Region No depletion  Dorsal Hand Moderate depletion  Patellar Region Mild depletion  Anterior Thigh Region No depletion  Posterior Calf Region No depletion  Edema (RD Assessment) None  Hair Reviewed  Eyes Reviewed  Mouth Reviewed  Skin Reviewed  Nails Reviewed   Diet Order:   Diet Order             Diet regular Room service appropriate? Yes; Fluid consistency: Thin  Diet effective now                   EDUCATION NEEDS:   Education needs have been addressed  Skin:  Skin Assessment: Reviewed RN Assessment  Last BM:  6/9  Height:   Ht Readings from Last 1 Encounters:  10/12/22 5\' 6"  (1.676 m)    Weight:   Wt Readings from Last 1 Encounters:  10/12/22 68 kg    Ideal Body Weight:  64.5 kg  BMI:  Body mass index is 24.2 kg/m.  Estimated Nutritional Needs:   Kcal:  1700-1900kcal/day  Protein:  85-95g/day  Fluid:  1.7-1.9L/day  Betsey Holiday MS, RD, LDN Please refer to [facility] for RD and/or RD on-call/weekend/after hours pager

## 2022-10-12 NOTE — H&P (Signed)
History and Physical    Patient: Richard Castaneda ZOX:[id number] DOB: May 18, 1929 DOA: 10/12/2022 DOS: the patient was seen and examined on 10/12/2022 PCP: Marisue Ivan, MD  Patient coming from: Home  Chief Complaint:  Chief Complaint  Patient presents with   Fall    HPI: Richard Castaneda is a 89 y.o. male with medical history significant for HTN, hypothyroidism who lives alone and has a son and daughter that check in on him daily sometimes spending the night who presents to the ED for a fall.  History taken from daughter at bedside. patient was going up 1 step from his den when he missed stepped and fell backwards.  He did not lose consciousness, and immediately called his daughter from his phone which he keeps on him.  At baseline he ambulates with a cane.  Patient was hospitalized back in April 2023 following a fall/possible syncope and workup revealed sepsis secondary to pneumonia with AKI.  Patient complains of pain in the wrist. ED course and data review: Vitals within normal limits.  Labs notable for WBC 11,000Globin 11, hemo-0.6 which is about his baseline.  Creatinine 1.62 up from baseline of 1.01 and mild hyponatremia of 134.  Troponin is 10 urinalysis unremarkable. EKG, personally viewed and interpreted showing NSR at 76 with no acute ST-T wave changes. Extensive trauma imaging was significant for right wrist fracture.  (Imaging included CT head, C-spine, chest x-ray, x-ray right wrist and right elbow and x-ray hips) Patient was treated with an IV fluid bolus Right wrist was splinted Hospitalist consulted for admission.     Past Medical History:  Diagnosis Date   Acquired hypothyroidism 11/16/2016   Anterior myocardial infarction (HCC) 12/29/2013   Overview:  2004   Benign essential HTN 12/29/2013   Benign prostatic hyperplasia with urinary frequency 10/19/2016   Borderline diabetes mellitus 11/16/2016   Coronary artery disease 12/29/2013   Overview:  Anterior MI, PCI and  stent placement of LAD11/04   H/O iron deficiency anemia 09/09/2015   History of BPH    Inguinal hernia, bilateral    Moderate tricuspid insufficiency 09/12/2014   Occasional tremors    Left hand only with writing and eating   Pre-diabetes    Primary osteoarthritis of left knee 08/26/2016   Pure hypercholesterolemia 12/29/2013   PVD (peripheral vascular disease) (HCC) 12/29/2013   Overview:  With carotid atherosclerosis    Past Surgical History:  Procedure Laterality Date   CATARACT EXTRACTION W/ INTRAOCULAR LENS  IMPLANT, BILATERAL     CORONARY ANGIOPLASTY  2004   1 stent   EYE SURGERY Bilateral    Cataract Extraction with IOL   HERNIA REPAIR Bilateral    Inguinal Hernia Repair   PARTIAL KNEE ARTHROPLASTY Left 12/31/2016   Procedure: UNICOMPARTMENTAL KNEE;  Surgeon: Christena Flake, MD;  Location: [facility] ORS;  Service: Orthopedics;  Laterality: Left;   TOTAL KNEE REVISION Left 06/22/2017   Procedure: TOTAL KNEE REVISION, CONVERTING A PARTIAL KNEE TO A TOTAL;  Surgeon: Christena Flake, MD;  Location: [facility] ORS;  Service: Orthopedics;  Laterality: Left;   Social History:  reports that he has never smoked. He has never used smokeless tobacco. He reports that he does not drink alcohol and does not use drugs.  Allergies  Allergen Reactions   Terazosin     Other reaction(s): Orthostatic hypotension    Family History  Problem Relation Age of Onset   Kidney failure Mother    Prostate cancer Paternal Uncle    Bladder Cancer Neg Hx  Kidney cancer Neg Hx     Prior to Admission medications   Medication Sig Start Date End Date Taking? Authorizing Provider  acetaminophen (TYLENOL) 650 MG CR tablet Take 650 mg by mouth 3 (three) times daily.    [provider]  amLODipine (NORVASC) 5 MG tablet Take 5 mg by mouth daily before breakfast.  10/30/16   [provider]  aspirin EC 81 MG tablet Take 81 mg by mouth daily.     [provider]  Cholecalciferol (VITAMIN D3)  400 units CAPS Take 400 Units by mouth daily with supper.    [provider]  Docusate Sodium (DSS) 100 MG CAPS Take by mouth.    [provider]  ferrous sulfate 325 (65 FE) MG tablet Take 325 mg by mouth daily with breakfast.    [provider]  finasteride (PROSCAR) 5 MG tablet TAKE 1 TABLET (5 MG TOTAL) BY MOUTH ONCE DAILY. 07/10/21   McGowan, Wellington Hampshire, PA-C  levothyroxine (SYNTHROID) 125 MCG tablet Take 125 mcg by mouth daily. 07/14/21   [provider]  lisinopril (PRINIVIL,ZESTRIL) 40 MG tablet Take 40 mg by mouth daily before breakfast.  10/27/16   [provider]  Misc Natural Products (OSTEO BI-FLEX ADV TRIPLE ST) TABS Take 1 tablet by mouth 2 (two) times daily.    [provider]  Omega-3 Fatty Acids (FISH OIL) 1200 MG CAPS Take 1,200 mg by mouth daily with breakfast.     [provider]  simvastatin (ZOCOR) 20 MG tablet Take 20 mg by mouth daily at 8 pm. (2000)    [provider]  tetrahydrozoline 0.05 % ophthalmic solution Place 1 drop into both eyes daily. Visine Clear    [provider]  Vibegron (GEMTESA) 75 MG TABS Take 1 tablet (75 mg total) by mouth daily. 09/30/22   Carman Ching, PA-C    Physical Exam: Vitals:   10/11/22 2242  BP: 138/68  Pulse: 79  Resp: 18  Temp: 97.9 F (36.6 C)  TempSrc: Oral  SpO2: 94%   Physical Exam Vitals and nursing note reviewed.  Constitutional:      General: He is not in acute distress. HENT:     Head: Normocephalic and atraumatic.  Cardiovascular:     Rate and Rhythm: Normal rate and regular rhythm.     Heart sounds: Normal heart sounds.  Pulmonary:     Effort: Pulmonary effort is normal.     Breath sounds: Normal breath sounds.  Abdominal:     Palpations: Abdomen is soft.     Tenderness: There is no abdominal tenderness.  Neurological:     Mental Status: Mental status is at baseline.     Motor: Tremor present.     Labs on Admission: I  have personally reviewed following labs and imaging studies  CBC: Recent Labs  Lab 10/11/22 2245  WBC 11.0*  NEUTROABS 8.6*  HGB 11.6*  HCT 35.2*  MCV 97.2  PLT 212   Basic Metabolic Panel: Recent Labs  Lab 10/11/22 2245  NA 134*  K 4.2  CL 103  CO2 22  GLUCOSE 101*  BUN 44*  CREATININE 1.62*  CALCIUM 8.7*   GFR: Estimated Creatinine Clearance: 27.3 mL/min (A) (by C-G formula based on SCr of 1.62 mg/dL (H)). Liver Function Tests: Recent Labs  Lab 10/11/22 2245  AST 26  ALT 28  ALKPHOS 60  BILITOT 0.8  PROT 6.9  ALBUMIN 4.0   No results for input(s): "LIPASE", "AMYLASE" in the  last 168 hours. No results for input(s): "AMMONIA" in the last 168 hours. Coagulation Profile: No results for input(s): "INR", "PROTIME" in the last 168 hours. Cardiac Enzymes: No results for input(s): "CKTOTAL", "CKMB", "CKMBINDEX", "TROPONINI" in the last 168 hours. BNP (last 3 results) No results for input(s): "PROBNP" in the last 8760 hours. HbA1C: No results for input(s): "HGBA1C" in the last 72 hours. CBG: No results for input(s): "GLUCAP" in the last 168 hours. Lipid Profile: No results for input(s): "CHOL", "HDL", "LDLCALC", "TRIG", "CHOLHDL", "LDLDIRECT" in the last 72 hours. Thyroid Function Tests: No results for input(s): "TSH", "T4TOTAL", "FREET4", "T3FREE", "THYROIDAB" in the last 72 hours. Anemia Panel: No results for input(s): "VITAMINB12", "FOLATE", "FERRITIN", "TIBC", "IRON", "RETICCTPCT" in the last 72 hours. Urine analysis:    Component Value Date/Time   COLORURINE STRAW (A) 10/11/2022 2245   APPEARANCEUR CLEAR (A) 10/11/2022 2245   APPEARANCEUR Hazy (A) 08/19/2022 1416   LABSPEC 1.008 10/11/2022 2245   PHURINE 5.0 10/11/2022 2245   GLUCOSEU NEGATIVE 10/11/2022 2245   HGBUR SMALL (A) 10/11/2022 2245   BILIRUBINUR NEGATIVE 10/11/2022 2245   BILIRUBINUR Negative 08/19/2022 1416   KETONESUR NEGATIVE 10/11/2022 2245   PROTEINUR NEGATIVE 10/11/2022 2245    NITRITE NEGATIVE 10/11/2022 2245   LEUKOCYTESUR TRACE (A) 10/11/2022 2245    Radiological Exams on Admission: CT Cervical Spine Wo Contrast  Result Date: 10/12/2022 CLINICAL DATA:  Neck trauma (Age >= 65y), fall EXAM: CT CERVICAL SPINE WITHOUT CONTRAST TECHNIQUE: Multidetector CT imaging of the cervical spine was performed without intravenous contrast. Multiplanar CT image reconstructions were also generated. RADIATION DOSE REDUCTION: This exam was performed according to the departmental dose-optimization program which includes automated exposure control, adjustment of the mA and/or kV according to patient size and/or use of iterative reconstruction technique. COMPARISON:  None Available. FINDINGS: Alignment: Normal. Skull base and vertebrae: Craniocervical alignment is normal. The atlantodental interval is not widened. No acute fracture of the cervical spine. Vertebral body height is preserved. Soft tissues and spinal canal: No prevertebral fluid or swelling. No visible canal hematoma. Disc levels: Mild disc space narrowing and endplate remodeling is seen at C4-C6 in keeping with changes of mild degenerative disc disease. Prevertebral soft tissues are not thickened. Spinal canal is widely patent. No high-grade neuroforaminal narrowing. Upper chest: Negative. Other: None IMPRESSION: 1. No acute fracture or listhesis of the cervical spine. Electronically Signed   By: Helyn Numbers M.D.   On: 10/12/2022 02:29   DG Chest 2 View  Result Date: 10/12/2022 CLINICAL DATA:  Fall EXAM: CHEST - 2 VIEW COMPARISON:  None Available. FINDINGS: Lungs are well expanded, symmetric, and clear. No pneumothorax or pleural effusion. Cardiac size within normal limits. Pulmonary vascularity is normal. Osseous structures are age-appropriate. No acute bone abnormality. IMPRESSION: No active cardiopulmonary disease. Electronically Signed   By: Helyn Numbers M.D.   On: 10/12/2022 02:26   DG Lumbar Spine 2-3 Views  Result Date:  10/12/2022 CLINICAL DATA:  Fall, back pain EXAM: LUMBAR SPINE - 2-3 VIEW COMPARISON:  None Available. FINDINGS: Intervertebral disc spaces are not optimally profiled on lateral examination. 7 mm retrolisthesis L4-5 noted, likely degenerative in nature. There is overall straightening of the lumbar spine which may relate to a underlying muscular spasm. Intervertebral disc space narrowing and endplate remodeling at L4-5 and L5-S1 in keeping with changes of advanced degenerative disc disease. No acute fracture or traumatic listhesis of the lumbar spine. Vascular calcifications are noted within the aortoiliac vasculature. IMPRESSION: 1. No acute fracture or traumatic  listhesis. 2. Advanced degenerative disc disease L4-5 and L5-S1. Electronically Signed   By: Helyn Numbers M.D.   On: 10/12/2022 02:24   DG Wrist Complete Right  Result Date: 10/12/2022 CLINICAL DATA:  Fall EXAM: RIGHT WRIST - COMPLETE 3+ VIEW COMPARISON:  None Available. FINDINGS: There is an acute, transverse, impacted fracture of the distal right radial metaphysis with resultant slight dorsal tilt of the distal radial articular surface. Radiocarpal articulation is preserved. No other fracture identified. Osseous structures are diffusely osteopenic. Degenerative changes are seen throughout the right thumb. IMPRESSION: 1. Acute, transverse, impacted fracture of the distal right radial metaphysis. Electronically Signed   By: Helyn Numbers M.D.   On: 10/12/2022 02:22   DG HIP UNILAT WITH PELVIS 2-3 VIEWS RIGHT  Result Date: 10/11/2022 CLINICAL DATA:  Hip pain after fall. EXAM: DG HIP (WITH OR WITHOUT PELVIS) 2-3V RIGHT COMPARISON:  None Available. FINDINGS: No acute fracture. Femoral head is normally located, no dislocation. Mild bilateral hip osteoarthritis. Pubic rami are intact. Pubic symphysis and sacroiliac joints are congruent. IMPRESSION: No fracture of the pelvis or right hip. Electronically Signed   By: Narda Rutherford M.D.   On: 10/11/2022  23:33   DG Elbow Complete Right  Result Date: 10/11/2022 CLINICAL DATA:  Fall, elbow pain. EXAM: RIGHT ELBOW - COMPLETE 3+ VIEW COMPARISON:  None Available. FINDINGS: There is no evidence of fracture, dislocation, or joint effusion. Minor degenerative change. There is an olecranon spur. Soft tissues are unremarkable. IMPRESSION: 1. No fracture or dislocation of the right elbow. 2. Mild degenerative change and olecranon spur. Electronically Signed   By: Narda Rutherford M.D.   On: 10/11/2022 23:32   CT HEAD WO CONTRAST ( )  Result Date: 10/11/2022 CLINICAL DATA:  Fall at home. EXAM: CT HEAD WITHOUT CONTRAST TECHNIQUE: Contiguous axial images were obtained from the base of the skull through the vertex without intravenous contrast. RADIATION DOSE REDUCTION: This exam was performed according to the departmental dose-optimization program which includes automated exposure control, adjustment of the mA and/or kV according to patient size and/or use of iterative reconstruction technique. COMPARISON:  MRI head 08/15/2021 and CT head 08/15/2021 FINDINGS: Brain: No intracranial hemorrhage, mass effect, or evidence of acute infarct. No hydrocephalus. No extra-axial fluid collection. Generalized cerebral atrophy. Ill-defined hypoattenuation within the cerebral white matter is nonspecific but consistent with chronic small vessel ischemic disease. Vascular: No hyperdense vessel. Intracranial arterial calcification. Skull: No fracture or focal lesion. Sinuses/Orbits: No acute finding. Paranasal sinuses and mastoid air cells are well aerated. Other: None. IMPRESSION: 1. No evidence of acute intracranial abnormality. 2. Generalized cerebral atrophy and chronic small vessel ischemic disease. Electronically Signed   By: Minerva Fester M.D.   On: 10/11/2022 23:10     Data Reviewed: Relevant notes from primary care and specialist visits, past discharge summaries as available in EHR, including Care Everywhere. Prior  diagnostic testing as pertinent to current admission diagnoses Updated medications and problem lists for reconciliation ED course, including vitals, labs, imaging, treatment and response to treatment Triage notes, nursing and pharmacy notes and ED provider's notes Notable results as noted in HPI   Assessment and Plan: * AKI (acute kidney injury) (HCC) Suspect prerenal secondary to dehydration, possible poor oral intake Creatinine 1.6 above baseline of 1.01 IV hydration and monitor  Right wrist fracture, closed, initial encounter Fall at home, initial encounter History of fall related to syncope 08/2021 Syncope not suspected. Will monitor on telemetry Wrist splinted in the ED Pain control Consider orthopedic consult  versus outpatient follow-up PT and TOC consults  Anemia Hemoglobin at baseline    Latest Ref Rng & Units 10/11/2022   10:45 PM 08/16/2021    6:43 AM 08/15/2021    4:15 AM  CBC  WBC 4.0 - 10.5 K/uL 11.0  13.8  12.0   Hemoglobin 13.0 - 17.0 g/dL 96.0  45.4  09.8   Hematocrit 39.0 - 52.0 % 35.2  33.3  29.9   Platelets 150 - 400 K/uL 212  242  233      Coronary artery disease No complaints of chest pain, EKG nonacute on first troponin 10 Continue aspirin, simvastatin and lisinopril  Essential hypertension Normotensive.  Continue lisinopril  Hypothyroidism Will check TSH, given fall and history of syncope Continue levothyroxine 175  Benign prostatic hyperplasia with urinary frequency Continue finasteride     DVT prophylaxis: Lovenox  Consults: none  Advance Care Planning:   Code Status: Prior   Family Communication: Daughter at bedside  Disposition Plan: Back to previous home environment  Severity of Illness: The appropriate patient status for this patient is OBSERVATION. Observation status is judged to be reasonable and necessary in order to provide the required intensity of service to ensure the patient's safety. The patient's presenting symptoms,  physical exam findings, and initial radiographic and laboratory data in the context of their medical condition is felt to place them at decreased risk for further clinical deterioration. Furthermore, it is anticipated that the patient will be medically stable for discharge from the hospital within 2 midnights of admission.   Author: Andris Baumann, MD 10/12/2022 3:25 AM  For on call review [email or web address].

## 2022-10-12 NOTE — Progress Notes (Signed)
PT Cancellation Note  Patient Details Name: Vander Kueker MRN: [id number] DOB: 01-07-1930   Cancelled Treatment:    Reason Eval/Treat Not Completed: Other (comment). Pt with tech eating breakfast, PT to re-attempt as able.    Olga Coaster PT, DPT 10:18 AM,10/12/22

## 2022-10-12 NOTE — Assessment & Plan Note (Signed)
Suspect prerenal secondary to dehydration, possible poor oral intake Creatinine 1.6 above baseline of 1.01 IV hydration and monitor

## 2022-10-12 NOTE — Plan of Care (Signed)

## 2022-10-12 NOTE — Progress Notes (Signed)
Anticoagulation monitoring(Lovenox):  89 yo male ordered Lovenox 40 mg Q24h    There were no vitals filed for this visit. BMI    Lab Results  Component Value Date   CREATININE 1.62 (H) 10/11/2022   CREATININE 1.01 08/16/2021   CREATININE 1.18 08/15/2021   Estimated Creatinine Clearance: 27.3 mL/min (A) (by C-G formula based on SCr of 1.62 mg/dL (H)). Hemoglobin & Hematocrit     Component Value Date/Time   HGB 11.6 (L) 10/11/2022 2245   HGB 13.2 03/16/2012 1452   HCT 35.2 (L) 10/11/2022 2245     Per Protocol for Patient with estCrcl < 30 ml/min and BMI < 30, will transition to Lovenox 30 mg Q24h.

## 2022-10-12 NOTE — Progress Notes (Signed)
Imaging reviewed. Appears to have minimally displaced R distal radius fracture. Per notes, splint was applied in ED. Recommend NWB on RUE. PT/OT as medically able.

## 2022-10-12 NOTE — TOC Progression Note (Addendum)
Transition of Care [facility]) - Progression Note    Patient Details  Name: Richard Castaneda MRN: [id number] Date of Birth: [birth year]  [facility] CM/SW Contact  Marlowe Sax, RN Phone Number: 10/12/2022, 11:37 AM  Clinical Narrative:    Spoke with the patient's son [name] The patient lives alone and his son and daughter check on him often and stay the night with him  His son stated that they are getting him set up with a ramp He has a rollator and a Standard walker but will need a platform rolling walker, requested [employer] to deliver I sent the referral to [place] at [place] for [facility] services    Expected Discharge Plan: Home w Home Health Services Barriers to Discharge: Continued Medical Work up  Expected Discharge Plan and Services   Discharge Planning Services: CM Consult Post Acute Care Choice: Home Health Living arrangements for the past 2 months: Single Family Home                 DME Arranged: Environmental consultant platform, Walker rolling DME Agency: [employer]       HH Arranged: PT, OT HH Agency: [facility] Date HH Agency Contacted: 10/12/22 Time HH Agency Contacted: 1135 Representative spoke with at [facility] Agency: [place]   Social Determinants of Health (SDOH) Interventions SDOH Screenings   Tobacco Use: Low Risk  (10/11/2022)    Readmission Risk Interventions    08/15/2021    3:18 PM  Readmission Risk Prevention Plan  Post Dischage Appt Complete  Medication Screening Complete  Transportation Screening Complete

## 2022-10-12 NOTE — Progress Notes (Signed)
Progress Note   Patient: Richard Castaneda WUJ:[id number] DOB: [birth year] DOA: 10/12/2022     0 DOS: the patient was seen and examined on 10/12/2022     Subjective:  Patient seen and examined at bedside this morning in the presence of patient's son Patient lives all by himself at home Denies worsening rest pain, nausea vomiting abdominal pain   Brief hospital course: From HPI "Richard Castaneda is a 89 y.o. male with medical history significant for HTN, hypothyroidism who lives alone and has a son and daughter that check in on him daily sometimes spending the night who presents to the ED for a fall.  History taken from daughter at bedside. patient was going up 1 step from his den when he missed stepped and fell backwards.  He did not lose consciousness, and immediately called his daughter from his phone which he keeps on him.  At baseline he ambulates with a cane.  Patient was hospitalized back in April 2023 following a fall/possible syncope and workup revealed sepsis secondary to pneumonia with AKI.  Patient complains of pain in the wrist. ED course and data review: Vitals within normal limits.  Labs notable for WBC 11,000Globin 11, hemo-0.6 which is about his baseline.  Creatinine 1.62 up from baseline of 1.01 and mild hyponatremia of 134.  Troponin is 10 urinalysis unremarkable. EKG, personally viewed and interpreted showing NSR at 76 with no acute ST-T wave changes. Extensive trauma imaging was significant for right wrist fracture.  (Imaging included CT head, C-spine, chest x-ray, x-ray right wrist and right elbow and x-ray hips) Patient was treated with an IV fluid bolus Right wrist was splinted Hospitalist consulted for admission. "    Assessment and Plan: AKI (acute kidney injury) (HCC) Renal function elevated from baseline likely secondary to dehydration Creatinine today improved Continue to monitor renal function closely Avoid nephrotoxic medications Renally dose all drugs   Right  wrist fracture, closed, initial encounter Fall at home, initial encounter History of fall related to syncope 08/2021 Syncope not suspected. Will monitor on telemetry Wrist splinted in the ED Pain control I consulted orthopedic surgeon and discussed the case with him At this point Dr. Allena Katz recommends for patient to have splint applied to the right distal and recommended nonweightbearing on the right upper extremity I have asked PT OT to see patient I have reviewed patient's wrist x-ray that showed acute impacted fracture of the distal radius metaphysis   Anemia Hemoglobin at baseline   Coronary artery disease No complaints of chest pain, EKG nonacute on first troponin 10 Continue aspirin, simvastatin and lisinopril   Essential hypertension Normotensive.  Continue lisinopril   Hypothyroidism TSH 1.97 Continue levothyroxine 175   Benign prostatic hyperplasia with urinary frequency Continue finasteride       DVT prophylaxis: Lovenox   Consults: none   Advance Care Planning:   Code Status: Prior    Family Communication: Daughter at bedside    Physical Exam: Vitals and nursing note reviewed.  Constitutional:      General: He is not in acute distress. HENT:     Head: Normocephalic and atraumatic.  Cardiovascular:     Rate and Rhythm: Normal rate and regular rhythm.     Heart sounds: Normal heart sounds.  Pulmonary:     Effort: Pulmonary effort is normal.     Breath sounds: Normal breath sounds.  Abdominal:     Palpations: Abdomen is soft.     Tenderness: There is no abdominal tenderness.  Neurological:  Mental Status: Mental status is at baseline.  Musculoskeletal: Splint noted to the right forearm  Vitals:   10/12/22 0451 10/12/22 0547 10/12/22 0554 10/12/22 0734  BP: (!) 148/65  (!) 144/66 135/63  Pulse: 94  98 82  Resp: 17   18  Temp: 99.8 F (37.7 C) 100.3 F (37.9 C)  99.5 F (37.5 C)  TempSrc: Oral Axillary  Oral  SpO2: 94%   95%  Weight:  68  kg    Height:  5\' 6"  (1.676 m)      Data Reviewed: I have reviewed patient's labsShowing WBC 12.6 hemoglobin 11.5  Family Communication: None of care discussed with patient's son present at bedside  Author: Loyce Dys, MD 10/12/2022 2:42 PM  For on call review [email or web address].

## 2022-10-12 NOTE — Assessment & Plan Note (Signed)
Normotensive.  Continue lisinopril

## 2022-10-12 NOTE — Assessment & Plan Note (Signed)
Continue finasteride

## 2022-10-13 DIAGNOSIS — N179 Acute kidney failure, unspecified: Secondary | ICD-10-CM | POA: Diagnosis not present

## 2022-10-13 LAB — CBC WITH DIFFERENTIAL/PLATELET
Abs Immature Granulocytes: 0.03 10*3/uL (ref 0.00–0.07)
Basophils Absolute: 0.1 10*3/uL (ref 0.0–0.1)
Basophils Relative: 1 %
Eosinophils Absolute: 0.4 10*3/uL (ref 0.0–0.5)
Eosinophils Relative: 4 %
HCT: 31.1 % — ABNORMAL LOW (ref 39.0–52.0)
Hemoglobin: 10.4 g/dL — ABNORMAL LOW (ref 13.0–17.0)
Immature Granulocytes: 0 %
Lymphocytes Relative: 14 %
Lymphs Abs: 1.3 10*3/uL (ref 0.7–4.0)
MCH: 31.9 pg (ref 26.0–34.0)
MCHC: 33.4 g/dL (ref 30.0–36.0)
MCV: 95.4 fL (ref 80.0–100.0)
Monocytes Absolute: 1.1 10*3/uL — ABNORMAL HIGH (ref 0.1–1.0)
Monocytes Relative: 12 %
Neutro Abs: 6.4 10*3/uL (ref 1.7–7.7)
Neutrophils Relative %: 69 %
Platelets: 175 10*3/uL (ref 150–400)
RBC: 3.26 MIL/uL — ABNORMAL LOW (ref 4.22–5.81)
RDW: 12.5 % (ref 11.5–15.5)
WBC: 9.1 10*3/uL (ref 4.0–10.5)
nRBC: 0 % (ref 0.0–0.2)

## 2022-10-13 LAB — BASIC METABOLIC PANEL
Anion gap: 5 (ref 5–15)
BUN: 38 mg/dL — ABNORMAL HIGH (ref 8–23)
CO2: 23 mmol/L (ref 22–32)
Calcium: 7.8 mg/dL — ABNORMAL LOW (ref 8.9–10.3)
Chloride: 108 mmol/L (ref 98–111)
Creatinine, Ser: 1.21 mg/dL (ref 0.61–1.24)
GFR, Estimated: 57 mL/min — ABNORMAL LOW (ref >60)
Glucose, Bld: 112 mg/dL — ABNORMAL HIGH (ref 70–99)
Potassium: 4.1 mmol/L (ref 3.5–5.1)
Sodium: 136 mmol/L (ref 135–145)

## 2022-10-13 MED ORDER — ENOXAPARIN SODIUM 40 MG/0.4ML IJ SOSY
40.0000 mg | PREFILLED_SYRINGE | INTRAMUSCULAR | Status: DC
  Administered 2022-10-13 – 2022-10-14 (×2): 40 mg via SUBCUTANEOUS
  Filled 2022-10-13 (×2): qty 0.4

## 2022-10-13 MED ORDER — MIRABEGRON ER 25 MG PO TB24
25.0000 mg | ORAL_TABLET | Freq: Every day | ORAL | Status: DC
  Administered 2022-10-13: 25 mg via ORAL
  Filled 2022-10-13: qty 1

## 2022-10-13 NOTE — Plan of Care (Signed)

## 2022-10-13 NOTE — [place] FL2 (Signed)
[place] MEDICAID FL2 LEVEL OF CARE FORM     IDENTIFICATION  Patient Name: Richard Castaneda Birthdate: 02-14-1930 Sex: male Admission Date (Current Location): 10/12/2022  [place] and [place] Number:  Chiropodist and Address:  [facility], [address] [place], [place] [address]      Provider Number: [id number]  Attending Physician Name and Address:  Loyce Dys, MD  Relative Name and Phone Number:  Docia Furl [phone]    Current Level of Care: Hospital Recommended Level of Care: Skilled Nursing Facility Prior Approval Number:    Date Approved/Denied:   PASRR Number: [id number]  Discharge Plan: SNF    Current Diagnoses: Patient Active Problem List   Diagnosis Date Noted   Fall at home, initial encounter 10/12/2022   Right wrist fracture, closed, initial encounter 10/12/2022   History of syncope 10/12/2022   Malnutrition of moderate degree 10/12/2022   Sepsis (HCC) 08/15/2021   CAP (community acquired pneumonia) 08/15/2021   Anemia 08/14/2021   Rectal bleeding 08/14/2021   Syncope and collapse 08/14/2021   AKI (acute kidney injury) (HCC) 08/14/2021   Acute respiratory failure with hypoxia (HCC) 08/14/2021   Dyspepsia and other specified disorders of function of stomach 07/09/2020   Hemorrhoids 07/09/2020   Inguinal hernia 07/09/2020   Other and unspecified hyperlipidemia 07/09/2020   Lumbar spondylosis 10/03/2018   CKD (chronic kidney disease) stage 3, GFR 30-59 ml/min (HCC) 09/01/2018   Bilateral carotid artery stenosis 05/27/2018   Status post revision of total knee replacement, left 06/22/2017   Status post unicompartmental knee replacement, left 12/31/2016   Borderline diabetes mellitus 11/16/2016   Hypothyroidism 11/16/2016   Benign prostatic hyperplasia with urinary frequency 10/19/2016   Primary osteoarthritis of left knee 08/26/2016   Vaccine counseling 03/09/2016   Medicare annual wellness visit, subsequent  03/09/2016   Medicare annual wellness visit, initial 03/09/2016   Moderate tricuspid insufficiency 09/12/2014   Moderate mitral insufficiency 09/12/2014   PVD (peripheral vascular disease) (HCC) 12/29/2013   Pure hypercholesterolemia 12/29/2013   Coronary artery disease 12/29/2013   Anterior myocardial infarction (HCC) 12/29/2013   Essential hypertension 12/29/2013    Orientation RESPIRATION BLADDER Height & Weight     Self, Time, Situation, Place  Normal Continent Weight: 68.2 kg Height:  5\' 6"  (167.6 cm)  BEHAVIORAL SYMPTOMS/MOOD NEUROLOGICAL BOWEL NUTRITION STATUS      Continent Diet (See DC summary)  AMBULATORY STATUS COMMUNICATION OF NEEDS Skin   Extensive Assist Verbally Normal, Skin abrasions, Surgical wounds (right arm)                       Personal Care Assistance Level of Assistance  Bathing, Feeding, Dressing Bathing Assistance: Limited assistance Feeding assistance: Independent Dressing Assistance: Limited assistance     Functional Limitations Info             SPECIAL CARE FACTORS FREQUENCY  PT (By licensed PT), OT (By licensed OT)     PT Frequency: 5 times per week OT Frequency: 5 times per week            Contractures Contractures Info: Not present    Additional Factors Info  Code Status, Allergies Code Status Info: full code Allergies Info: Terazosin           Current Medications (10/13/2022):  This is the current hospital active medication list Current Facility-Administered Medications  Medication Dose Route Frequency Provider Last Rate Last Admin   acetaminophen (TYLENOL) tablet 650 mg  650 mg  Oral Q6H PRN Andris Baumann, MD       Or   acetaminophen (TYLENOL) suppository 650 mg  650 mg Rectal Q6H PRN Andris Baumann, MD       amLODipine (NORVASC) tablet 10 mg  10 mg Oral Daily Lindajo Royal V, MD   10 mg at 10/13/22 0930   aspirin EC tablet 81 mg  81 mg Oral Daily Lindajo Royal V, MD   81 mg at 10/13/22 0930   enoxaparin  (LOVENOX) injection 40 mg  40 mg Subcutaneous Q24H Rocio, Rome, RPH   40 mg at 10/13/22 0816   feeding supplement (GLUCERNA SHAKE) (GLUCERNA SHAKE) liquid 237 mL  237 mL Oral TID BM Rosezetta Schlatter T, MD   237 mL at 10/13/22 0928   ferrous sulfate tablet 325 mg  325 mg Oral Q breakfast Andris Baumann, MD   325 mg at 10/13/22 0816   finasteride (PROSCAR) tablet 5 mg  5 mg Oral Daily Andris Baumann, MD   5 mg at 10/13/22 0928   HYDROcodone-acetaminophen (NORCO/VICODIN) 5-325 MG per tablet 1-2 tablet  1-2 tablet Oral Q4H PRN Andris Baumann, MD       levothyroxine (SYNTHROID) tablet 175 mcg  175 mcg Oral Q0600 Andris Baumann, MD   175 mcg at 10/13/22 0813   lisinopril (ZESTRIL) tablet 20 mg  20 mg Oral QAC breakfast Andris Baumann, MD   20 mg at 10/13/22 0816   mirabegron ER (MYRBETRIQ) tablet 25 mg  25 mg Oral QHS Loyce Dys, MD       multivitamin with minerals tablet 1 tablet  1 tablet Oral Daily Loyce Dys, MD   1 tablet at 10/13/22 0930   ondansetron (ZOFRAN) tablet 4 mg  4 mg Oral Q6H PRN Andris Baumann, MD       Or   ondansetron [facility]) injection 4 mg  4 mg Intravenous Q6H PRN Andris Baumann, MD         Discharge Medications: Please see discharge summary for a list of discharge medications.  Relevant Imaging Results:  Relevant Lab Results:   Additional Information SS# [id number]  Marlowe Sax, RN

## 2022-10-13 NOTE — TOC Progression Note (Signed)
Transition of Care [facility]) - CM/SW Discharge Note   Patient Details  Name: Richard Castaneda MRN: [id number] Date of Birth: July 19, 1929  [facility]) CM/SW Contact:  Marlowe Sax, RN Phone Number: 10/13/2022, 4:14 PM   Clinical Narrative:     Met with the patient and family in the room and reviewed the bed offer, they will discuss with other family and call me to let me know their choice  Update, Son called and let me know they chose [employer], I notified Tiffany at [facility], IS pending  Final next level of care:  (TBD) Barriers to Discharge: Continued Medical Work up   Patient Goals and CMS Choice      Discharge Placement                         Discharge Plan and Services Additional resources added to the After Visit Summary for     Discharge Planning Services: CM Consult Post Acute Care Choice: Home Health          DME Arranged: Environmental consultant platform, Environmental consultant rolling DME Agency: [employer]       HH Arranged: PT, OT HH Agency: [facility] Date HH Agency Contacted: 10/12/22 Time HH Agency Contacted: 1135 Representative spoke with at [facility] Agency: [place]  Social Determinants of Health (SDOH) Interventions SDOH Screenings   Tobacco Use: Low Risk  (10/11/2022)     Readmission Risk Interventions    08/15/2021    3:18 PM  Readmission Risk Prevention Plan  Post Dischage Appt Complete  Medication Screening Complete  Transportation Screening Complete

## 2022-10-13 NOTE — Progress Notes (Signed)
Physical Therapy Treatment Patient Details Name: Richard Castaneda MRN: [id number] DOB: 1929-06-19 Today's Date: 10/13/2022   History of Present Illness Pt is a 89 y/o M who presents with a broken R radius following a fall at home, all other imaging negative. PMH significant for HTN, HLD, BPH, MI, DM, CAD, PVD, history of falls, and L TKA.    PT Comments    Patient alert, agreeable to PT despite just returning to bed with nursing. Denied pain but exhibited/grimaced and referenced R leg pain after walking. He required more assistance today due to more difficulty maintaining standing balance. ModA-minA for bed mobility, minAx2 to stand and constant minA with weight shifting, RW management, and step by step cueing for sequencing. Noted for some festination and freezing of gait as well; decreased step length bilaterally. He was able to ambulate ~34ft with RW and very close chair follow. Returned to bed with needs in reach at end of session. The patient would benefit from further skilled PT intervention to continue to progress towards goals.     Recommendations for follow up therapy are one component of a multi-disciplinary discharge planning process, led by the attending physician.  Recommendations may be updated based on patient status, additional functional criteria and insurance authorization.  Follow Up Recommendations  Can patient physically be transported by private vehicle: Yes    Assistance Recommended at Discharge Frequent or constant Supervision/Assistance  Patient can return home with the following Assistance with cooking/housework;Assist for transportation;Assistance with feeding;Help with stairs or ramp for entrance;Direct supervision/assist for medications management;A little help with walking and/or transfers;A lot of help with bathing/dressing/bathroom   Equipment Recommendations  Other (comment) (TBD at next venue of care)    Recommendations for Other Services       Precautions  / Restrictions Precautions Precautions: Fall Restrictions Weight Bearing Restrictions: Yes RUE Weight Bearing: Non weight bearing Other Position/Activity Restrictions: platform walker use allowed WB through elbow, verified with MD via secure chat     Mobility  Bed Mobility Overal bed mobility: Needs Assistance Bed Mobility: Supine to Sit, Sit to Supine     Supine to sit: Mod assist Sit to supine: Min assist        Transfers Overall transfer level: Needs assistance Equipment used: Right platform walker Transfers: Sit to/from Stand Sit to Stand: Min assist, +2 physical assistance           General transfer comment: VC for hand placement    Ambulation/Gait Ambulation/Gait assistance: Min assist Gait Distance (Feet): 60 Feet Assistive device: Right platform walker Gait Pattern/deviations: Decreased stride length, Narrow base of support, Drifts right/left       General Gait Details: some freezing and festination of gait noted   Stairs             Wheelchair Mobility    Modified Rankin (Stroke Patients Only)       Balance Overall balance assessment: Needs assistance, History of Falls Sitting-balance support: Feet supported Sitting balance-Leahy Scale: Fair     Standing balance support: Bilateral upper extremity supported Standing balance-Leahy Scale: Fair Standing balance comment: progressed to fair with extended time                            Cognition Arousal/Alertness: Awake/alert Behavior During Therapy: WFL for tasks assessed/performed Overall Cognitive Status: Within Functional Limits for tasks assessed  General Comments: some delayed processing today noted. needed step by step sequencing        Exercises Other Exercises Other Exercises: seated marches, LAQ and toe raises x15 seconds each exercise, bilaterally    General Comments        Pertinent Vitals/Pain Pain  Assessment Pain Assessment: Faces Faces Pain Scale: Hurts a little bit Pain Location: R hip Pain Descriptors / Indicators: Grimacing Pain Intervention(s): Limited activity within patient's tolerance, Monitored during session, Repositioned    Home Living                          Prior Function            PT Goals (current goals can now be found in the care plan section) Progress towards PT goals: Progressing toward goals    Frequency    Min 3X/week      PT Plan Current plan remains appropriate    Co-evaluation              AM-PAC PT "6 Clicks" Mobility   Outcome Measure  Help needed turning from your back to your side while in a flat bed without using bedrails?: A Little Help needed moving from lying on your back to sitting on the side of a flat bed without using bedrails?: A Lot Help needed moving to and from a bed to a chair (including a wheelchair)?: A Lot Help needed standing up from a chair using your arms (e.g., wheelchair or bedside chair)?: A Lot Help needed to walk in hospital room?: A Lot Help needed climbing 3-5 steps with a railing? : Total 6 Click Score: 12    End of Session Equipment Utilized During Treatment: Gait belt Activity Tolerance: Patient tolerated treatment well Patient left: Other (comment);in bed;with bed alarm set;with call bell/phone within reach Nurse Communication: Mobility status PT Visit Diagnosis: Unsteadiness on feet (R26.81);History of falling (Z91.81);Muscle weakness (generalized) (M62.81)     Time: 1355-1420 PT Time Calculation (min) (ACUTE ONLY): 25 min  Charges:  $Gait Training: 8-22 mins $Therapeutic Activity: 8-22 mins                     Olga Coaster PT, DPT 2:59 PM,10/13/22

## 2022-10-13 NOTE — Progress Notes (Signed)
Progress Note   Patient: Richard Castaneda BJY:[id number] DOB: [birth year] DOA: 10/12/2022     0 DOS: the patient was seen and examined on 10/13/2022    Subjective:  Patient seen this morning in the presence of the daughter Patient lives all by himself PT OT has seen patient today and have recommended skilled nursing facility Have discussed the case with transition of care and they are looking for bed availability   Brief hospital course: From HPI "Richard Castaneda is a 89 y.o. male with medical history significant for HTN, hypothyroidism who lives alone and has a son and daughter that check in on him daily sometimes spending the night who presents to the ED for a fall.  History taken from daughter at bedside. patient was going up 1 step from his den when he missed stepped and fell backwards.  He did not lose consciousness, and immediately called his daughter from his phone which he keeps on him.  At baseline he ambulates with a cane.  Patient was hospitalized back in April 2023 following a fall/possible syncope and workup revealed sepsis secondary to pneumonia with AKI.  Patient complains of pain in the wrist. ED course and data review: Vitals within normal limits.  Labs notable for WBC 11,000Globin 11, hemo-0.6 which is about his baseline.  Creatinine 1.62 up from baseline of 1.01 and mild hyponatremia of 134.  Troponin is 10 urinalysis unremarkable. EKG, personally viewed and interpreted showing NSR at 76 with no acute ST-T wave changes. Extensive trauma imaging was significant for right wrist fracture.  (Imaging included CT head, C-spine, chest x-ray, x-ray right wrist and right elbow and x-ray hips) Patient was treated with an IV fluid bolus Right wrist was splinted Hospitalist consulted for admission. "     Assessment and Plan: AKI (acute kidney injury) (HCC) Renal function elevated from baseline likely secondary to dehydration Renal function improved to normal range Continue to monitor  renal function closely Avoid nephrotoxic medications Renally dose all drugs IV fluid have been discontinued   Right wrist fracture, closed, initial encounter Fall at home, initial encounter History of fall related to syncope 08/2021 Syncope not suspected. Will monitor on telemetry Wrist splinted in the ED Continue pain control Orthopedic surgeon consulted At this point Dr. Allena Katz recommends for patient to have splint applied to the right distal and recommended nonweightbearing on the right upper extremity PT OT saw patient and have recommended skilled nursing facility placement Case discussed with orthopedic surgeon Patient will follow-up as an outpatient with orthopedics   Anemia Hemoglobin at baseline   Coronary artery disease No complaints of chest pain, EKG nonacute on first troponin 10 Continue aspirin, simvastatin and lisinopril   Essential hypertension Normotensive.  Continue lisinopril   Hypothyroidism TSH 1.97 Continue levothyroxine 175   Benign prostatic hyperplasia with urinary frequency Continue finasteride       DVT prophylaxis: Lovenox   Consults: none   Advance Care Planning:   Code Status: Prior    Family Communication: Daughter at bedside       Physical Exam: Vitals and nursing note reviewed.  Constitutional:      General: He is not in acute distress. HENT:     Head: Normocephalic and atraumatic.  Cardiovascular:     Rate and Rhythm: Normal rate and regular rhythm.     Heart sounds: Normal heart sounds.  Pulmonary:     Effort: Pulmonary effort is normal.     Breath sounds: Normal breath sounds.  Abdominal:  Palpations: Abdomen is soft.     Tenderness: There is no abdominal tenderness.  Neurological:     Mental Status: Mental status is at baseline.  Musculoskeletal: Splint noted to the right forearm    Data Reviewed: Labs reviewed by me showed significant improvement in creatinine to 1.2, sodium 136 potassium 4.1 WBC 9.1 hemoglobin  10.4   Family Communication: Discussed with patient's daughter present at bedside    Vitals:   10/12/22 1540 10/12/22 2127 10/13/22 0500 10/13/22 0719  BP: (!) 123/59 139/61  134/63  Pulse: 70 93  80  Resp: 16 20  18   Temp: 98.9 F (37.2 C) 98 F (36.7 C)  98.4 F (36.9 C)  TempSrc:    Oral  SpO2: 94% 93%  94%  Weight:   68.2 kg   Height:         Author: Loyce Dys, MD 10/13/2022 5:52 PM  For on call review [email or web address].

## 2022-10-13 NOTE — TOC Progression Note (Signed)
Transition of Care [facility]) - Progression Note    Patient Details  Name: Hisham Provence MRN: [id number] Date of Birth: 1929-09-05  Transition of [facility]) CM/SW Contact  Marlowe Sax, RN Phone Number: 10/13/2022, 2:02 PM  Clinical Narrative:     Met with the patient and discussed DC plan and needs, he stated that he is agreeable to go to [facility], Agreed to a bedsearch  Expected Discharge Plan: Skilled Nursing Facility Barriers to Discharge: Continued Medical Work up  Expected Discharge Plan and Services   Discharge Planning Services: CM Consult Post Acute Care Choice: Home Health Living arrangements for the past 2 months: Single Family Home                 DME Arranged: Scientific laboratory technician, Walker rolling DME Agency: [employer]       HH Arranged: PT, OT HH Agency: [facility] Date HH Agency Contacted: 10/12/22 Time HH Agency Contacted: 1135 Representative spoke with at [facility] Agency: [place]   Social Determinants of Health (SDOH) Interventions SDOH Screenings   Tobacco Use: Low Risk  (10/11/2022)    Readmission Risk Interventions    08/15/2021    3:18 PM  Readmission Risk Prevention Plan  Post Dischage Appt Complete  Medication Screening Complete  Transportation Screening Complete

## 2022-10-14 ENCOUNTER — Encounter: Payer: Self-pay | Admitting: Internal Medicine

## 2022-10-14 DIAGNOSIS — S62101A Fracture of unspecified carpal bone, right wrist, initial encounter for closed fracture: Secondary | ICD-10-CM | POA: Diagnosis not present

## 2022-10-14 LAB — CBC WITH DIFFERENTIAL/PLATELET
Abs Immature Granulocytes: 0.04 10*3/uL (ref 0.00–0.07)
Basophils Absolute: 0 10*3/uL (ref 0.0–0.1)
Basophils Relative: 0 %
Eosinophils Absolute: 0.6 10*3/uL — ABNORMAL HIGH (ref 0.0–0.5)
Eosinophils Relative: 6 %
HCT: 31.8 % — ABNORMAL LOW (ref 39.0–52.0)
Hemoglobin: 10.6 g/dL — ABNORMAL LOW (ref 13.0–17.0)
Immature Granulocytes: 0 %
Lymphocytes Relative: 15 %
Lymphs Abs: 1.5 10*3/uL (ref 0.7–4.0)
MCH: 31.7 pg (ref 26.0–34.0)
MCHC: 33.3 g/dL (ref 30.0–36.0)
MCV: 95.2 fL (ref 80.0–100.0)
Monocytes Absolute: 1.2 10*3/uL — ABNORMAL HIGH (ref 0.1–1.0)
Monocytes Relative: 12 %
Neutro Abs: 6.3 10*3/uL (ref 1.7–7.7)
Neutrophils Relative %: 67 %
Platelets: 171 10*3/uL (ref 150–400)
RBC: 3.34 MIL/uL — ABNORMAL LOW (ref 4.22–5.81)
RDW: 12.4 % (ref 11.5–15.5)
WBC: 9.6 10*3/uL (ref 4.0–10.5)
nRBC: 0 % (ref 0.0–0.2)

## 2022-10-14 LAB — BASIC METABOLIC PANEL
Anion gap: 6 (ref 5–15)
BUN: 36 mg/dL — ABNORMAL HIGH (ref 8–23)
CO2: 22 mmol/L (ref 22–32)
Calcium: 8.3 mg/dL — ABNORMAL LOW (ref 8.9–10.3)
Chloride: 105 mmol/L (ref 98–111)
Creatinine, Ser: 1.22 mg/dL (ref 0.61–1.24)
GFR, Estimated: 56 mL/min — ABNORMAL LOW (ref >60)
Glucose, Bld: 114 mg/dL — ABNORMAL HIGH (ref 70–99)
Potassium: 3.8 mmol/L (ref 3.5–5.1)
Sodium: 133 mmol/L — ABNORMAL LOW (ref 135–145)

## 2022-10-14 LAB — VITAMIN B12: Vitamin B-12: 225 pg/mL (ref 180–914)

## 2022-10-14 MED ORDER — GLUCERNA SHAKE PO LIQD: 237.0000 mL | Freq: Three times a day (TID) | ORAL | 0 refills | Status: DC

## 2022-10-14 MED ORDER — ADULT MULTIVITAMIN W/MINERALS CH: 1.0000 | ORAL_TABLET | Freq: Every day | ORAL | Status: DC

## 2022-10-14 NOTE — Assessment & Plan Note (Signed)
Appreciate dietitian's input. Started on Multivitamin and Glucerna shakes.

## 2022-10-14 NOTE — Assessment & Plan Note (Signed)
Not suspected to have contributed to this most recent fall; per history fall was mechanical.

## 2022-10-14 NOTE — TOC Progression Note (Signed)
Transition of Care [facility]) - Progression Note    Patient Details  Name: Richard Castaneda MRN: [id number] Date of Birth: 11-Nov-1929  Transition of Care [facility]) CM/SW Contact  Marlowe Sax, RN Phone Number: 10/14/2022, 11:59 AM  Clinical Narrative:     Tessie Eke and let him know that he will go to room 603 at [facility] called to transport  Expected Discharge Plan: Skilled Nursing Facility Barriers to Discharge: Continued Medical Work up  Expected Discharge Plan and Services   Discharge Planning Services: CM Consult Post Acute Care Choice: Home Health Living arrangements for the past 2 months: Single Family Home Expected Discharge Date: 10/14/22               DME Arranged: Dan Humphreys platform, Walker rolling DME Agency: [employer]       HH Arranged: PT, OT HH Agency: [facility] Date HH Agency Contacted: 10/12/22 Time HH Agency Contacted: 1135 Representative spoke with at [facility] Agency: [place]   Social Determinants of Health (SDOH) Interventions SDOH Screenings   Tobacco Use: Low Risk  (10/14/2022)    Readmission Risk Interventions    08/15/2021    3:18 PM  Readmission Risk Prevention Plan  Post Dischage Appt Complete  Medication Screening Complete  Transportation Screening Complete

## 2022-10-14 NOTE — Plan of Care (Signed)
Patient discharged per MD orders at this time.All dc instructions,medication and education reviewed with the patient.Pt expressed understanding and will comply with dc instructions.follow up appointments was also communicated to the Pt.no verbal c/o or any ssx of distress.Pt was discharged to the [facility]hab center for PT/OT services per order.report was called to staff nurse Soumi before transport.Pt was transported by 2 [facility] personnel on a stretcher.

## 2022-10-14 NOTE — Assessment & Plan Note (Signed)
Continue PT/OT at rehab

## 2022-10-14 NOTE — Discharge Summary (Signed)
Physician Discharge Summary   Patient: Richard Castaneda MRN: [id number] DOB: 03-21-1930  Admit date:     10/12/2022  Discharge date: 10/14/22  Discharge Physician: Pennie Banter   PCP: Marisue Ivan, MD   Recommendations at discharge:   Follow up with Dr. Signa Kell, [facility] Orthopedic surgery, in 1-2 weeks Maintain Right forearm / wrist splint Non-Weight-Bearing through the R wrist (may bear weight through R elbow and shoulder) Follow up pending Vitamin B12 level Follow up with Primary Care in 1-2 weeks  Discharge Diagnoses: Principal Problem:   Right wrist fracture, closed, initial encounter Active Problems:   Fall at home, initial encounter   Anemia   Coronary artery disease   Essential hypertension   Hypothyroidism   Benign prostatic hyperplasia with urinary frequency   History of syncope   Malnutrition of moderate degree  Resolved Problems:   AKI (acute kidney injury) [facility])  Hospital Course: From HPI "Richard Castaneda is a 89 y.o. male with medical history significant for HTN, hypothyroidism who lives alone and has a son and daughter that check in on him daily sometimes spending the night who presents to the ED for a fall.  History taken from daughter at bedside. patient was going up 1 step from his den when he missed stepped and fell backwards.  He did not lose consciousness, and immediately called his daughter from his phone which he keeps on him.  At baseline he ambulates with a cane.  Patient was hospitalized back in April 2023 following a fall/possible syncope and workup revealed sepsis secondary to pneumonia with AKI.  Patient complains of pain in the wrist. ED course and data review: Vitals within normal limits.  Labs notable for WBC 11,000Globin 11, hemo-0.6 which is about his baseline.  Creatinine 1.62 up from baseline of 1.01 and mild hyponatremia of 134.  Troponin is 10 urinalysis unremarkable. EKG, personally viewed and interpreted showing NSR at 76 with no acute  ST-T wave changes. Extensive trauma imaging was significant for right wrist fracture.  (Imaging included CT head, C-spine, chest x-ray, x-ray right wrist and right elbow and x-ray hips) Patient was treated with an IV fluid bolus Right wrist was splinted Hospitalist consulted for admission. "   10/14/22 -- pt seen up in recliner, son at bedside.  He reports leg pain since his fall.  No pain in the right wrist, splinted.   Patient is medically stable for d/c to rehab today. Son to call and schedule Ortho follow up appointment.   Assessment and Plan: * Right wrist fracture, closed, initial encounter Fall at home, initial encounter History of fall related to syncope 08/2021 Syncope not suspected. Wrist splinted in the ED Per Orthopedic Surgery (Dr. Eliane Decree, [facility]) -- follow up in 1-2 weeks. No weight bearing through right wrist Pain control with Tylenol PRN PT/OT recommend SNF/rehab  AKI (acute kidney injury) (HCC)-resolved as of 10/14/2022 Suspect prerenal secondary to dehydration, possible poor oral intake Creatinine 1.6 above baseline of 1.01 IV hydration and monitor  Fall at home, initial encounter Continue PT/OT at rehab  Anemia Hemoglobin at baseline Repeat CBC in 1-2 weeks at follow up Pending Vitamin B12 level (for evaluation of bilateral leg weakness and falls)  Coronary artery disease No complaints of chest pain, EKG nonacute on first troponin 10 Continue aspirin, simvastatin and lisinopril  Essential hypertension BP's stable.   Continue lisinopril, amlodipine.  Hypothyroidism TSH normal. Continue levothyroxine 175 mcg PCP follow up  Malnutrition of moderate degree Appreciate dietitian's input. Started  on Multivitamin and Glucerna shakes.  History of syncope Not suspected to have contributed to this most recent fall; per history fall was mechanical.  Benign prostatic hyperplasia with urinary frequency Continue finasteride and Gemtesa (family to  bring if not on formulary)         Consultants: Orthopedic surgery Procedures performed: Right wrist splint applied   Disposition: Skilled nursing facility  Diet recommendation:  Discharge Diet Orders (From admission, onward)     Start     Ordered   10/14/22 0000  Diet - low sodium heart healthy        10/14/22 1109            DISCHARGE MEDICATION: Allergies as of 10/14/2022       Reactions   Terazosin    Other reaction(s): Orthostatic hypotension        Medication List     STOP taking these medications    Fish Oil 1200 MG Caps   Osteo Bi-Flex Adv Triple St Tabs   tetrahydrozoline 0.05 % ophthalmic solution       TAKE these medications    acetaminophen 650 MG CR tablet Commonly known as: TYLENOL Take 650 mg by mouth 3 (three) times daily.   amLODipine 10 MG tablet Commonly known as: NORVASC Take 10 mg by mouth daily.   aspirin EC 81 MG tablet Take 81 mg by mouth daily.   cholecalciferol 25 MCG (1000 UNIT) tablet Commonly known as: VITAMIN D3 Take 400 Units by mouth daily with supper.   DSS 100 MG Caps Take 100 mg by mouth daily as needed.   feeding supplement (GLUCERNA SHAKE) Liqd Take 237 mLs by mouth 3 (three) times daily between meals.   ferrous sulfate 325 (65 FE) MG tablet Take 325 mg by mouth daily with breakfast.   finasteride 5 MG tablet Commonly known as: PROSCAR TAKE 1 TABLET (5 MG TOTAL) BY MOUTH ONCE DAILY.   Gemtesa 75 MG Tabs Generic drug: Vibegron Take 1 tablet (75 mg total) by mouth daily.   iVIZIA Dry Eyes 0.5 % Soln Generic drug: Povidone (PF) Apply 1 drop to eye daily.   levothyroxine 175 MCG tablet Commonly known as: SYNTHROID Take 175 mcg by mouth daily.   lisinopril 20 MG tablet Commonly known as: ZESTRIL Take 20 mg by mouth daily before breakfast.   multivitamin with minerals Tabs tablet Take 1 tablet by mouth daily. Start taking on: October 15, 2022   simvastatin 10 MG tablet Commonly known as:  ZOCOR Take 10 mg by mouth daily. What changed: Another medication with the same name was removed. Continue taking this medication, and follow the directions you see here.               Durable Medical Equipment  (From admission, onward)           Start     Ordered   10/12/22 1137  For home use only DME Walker rolling  Once       Question Answer Comment  Walker: With 5 Inch Wheels   Patient needs a walker to treat with the following condition Impaired mobility      10/12/22 1137   10/12/22 1137  For home use only DME Walker platform  Once       Question:  Patient needs a walker to treat with the following condition  Answer:  Impaired mobility   10/12/22 1137            Contact information for after-discharge care  Destination     [facility] Preferred SNF .   Service: Skilled Nursing Contact information: [address] [place] [address] [phone]                    Discharge Exam: Filed Weights   10/12/22 0547 10/13/22 0500 10/14/22 0500  Weight: 68 kg 68.2 kg 69.1 kg   General exam: awake, alert, no acute distress HEENT: atraumatic, clear conjunctiva, anicteric sclera, moist mucus membranes, hearing grossly normal  Respiratory system: CTAB, no wheezes, rales or rhonchi, normal respiratory effort. Cardiovascular system: normal S1/S2, RRR, no JVD, murmurs, rubs, gallops,  no pedal edema.   Gastrointestinal system: soft, NT, ND, no HSM felt, +bowel sounds. Central nervous system: A&O x 3. no gross focal neurologic deficits, normal speech Extremities: right forearm splint and ace bandage in place with normal distal finger temp and intact sensation Skin: dry, intact, normal temperature Psychiatry: normal mood, congruent affect, judgement and insight appear normal   Condition at discharge: stable  The results of significant diagnostics from this  hospitalization (including imaging, microbiology, ancillary and laboratory) are listed below for reference.   Imaging Studies: CT Cervical Spine Wo Contrast  Result Date: 10/12/2022 CLINICAL DATA:  Neck trauma (Age >= 65y), fall EXAM: CT CERVICAL SPINE WITHOUT CONTRAST TECHNIQUE: Multidetector CT imaging of the cervical spine was performed without intravenous contrast. Multiplanar CT image reconstructions were also generated. RADIATION DOSE REDUCTION: This exam was performed according to the departmental dose-optimization program which includes automated exposure control, adjustment of the mA and/or kV according to patient size and/or use of iterative reconstruction technique. COMPARISON:  None Available. FINDINGS: Alignment: Normal. Skull base and vertebrae: Craniocervical alignment is normal. The atlantodental interval is not widened. No acute fracture of the cervical spine. Vertebral body height is preserved. Soft tissues and spinal canal: No prevertebral fluid or swelling. No visible canal hematoma. Disc levels: Mild disc space narrowing and endplate remodeling is seen at C4-C6 in keeping with changes of mild degenerative disc disease. Prevertebral soft tissues are not thickened. Spinal canal is widely patent. No high-grade neuroforaminal narrowing. Upper chest: Negative. Other: None IMPRESSION: 1. No acute fracture or listhesis of the cervical spine. Electronically Signed   By: Helyn Numbers M.D.   On: 10/12/2022 02:29   DG Chest 2 View  Result Date: 10/12/2022 CLINICAL DATA:  Fall EXAM: CHEST - 2 VIEW COMPARISON:  None Available. FINDINGS: Lungs are well expanded, symmetric, and clear. No pneumothorax or pleural effusion. Cardiac size within normal limits. Pulmonary vascularity is normal. Osseous structures are age-appropriate. No acute bone abnormality. IMPRESSION: No active cardiopulmonary disease. Electronically Signed   By: Helyn Numbers M.D.   On: 10/12/2022 02:26   DG Lumbar Spine 2-3  Views  Result Date: 10/12/2022 CLINICAL DATA:  Fall, back pain EXAM: LUMBAR SPINE - 2-3 VIEW COMPARISON:  None Available. FINDINGS: Intervertebral disc spaces are not optimally profiled on lateral examination. 7 mm retrolisthesis L4-5 noted, likely degenerative in nature. There is overall straightening of the lumbar spine which may relate to a underlying muscular spasm. Intervertebral disc space narrowing and endplate remodeling at L4-5 and L5-S1 in keeping with changes of advanced degenerative disc disease. No acute fracture or traumatic listhesis of the lumbar spine. Vascular calcifications are noted within the aortoiliac vasculature. IMPRESSION: 1. No acute fracture or traumatic listhesis. 2. Advanced degenerative disc disease L4-5 and L5-S1. Electronically Signed   By: Lyda Kalata.D.  On: 10/12/2022 02:24   DG Wrist Complete Right  Result Date: 10/12/2022 CLINICAL DATA:  Fall EXAM: RIGHT WRIST - COMPLETE 3+ VIEW COMPARISON:  None Available. FINDINGS: There is an acute, transverse, impacted fracture of the distal right radial metaphysis with resultant slight dorsal tilt of the distal radial articular surface. Radiocarpal articulation is preserved. No other fracture identified. Osseous structures are diffusely osteopenic. Degenerative changes are seen throughout the right thumb. IMPRESSION: 1. Acute, transverse, impacted fracture of the distal right radial metaphysis. Electronically Signed   By: Helyn Numbers M.D.   On: 10/12/2022 02:22   DG HIP UNILAT WITH PELVIS 2-3 VIEWS RIGHT  Result Date: 10/11/2022 CLINICAL DATA:  Hip pain after fall. EXAM: DG HIP (WITH OR WITHOUT PELVIS) 2-3V RIGHT COMPARISON:  None Available. FINDINGS: No acute fracture. Femoral head is normally located, no dislocation. Mild bilateral hip osteoarthritis. Pubic rami are intact. Pubic symphysis and sacroiliac joints are congruent. IMPRESSION: No fracture of the pelvis or right hip. Electronically Signed   By: Narda Rutherford  M.D.   On: 10/11/2022 23:33   DG Elbow Complete Right  Result Date: 10/11/2022 CLINICAL DATA:  Fall, elbow pain. EXAM: RIGHT ELBOW - COMPLETE 3+ VIEW COMPARISON:  None Available. FINDINGS: There is no evidence of fracture, dislocation, or joint effusion. Minor degenerative change. There is an olecranon spur. Soft tissues are unremarkable. IMPRESSION: 1. No fracture or dislocation of the right elbow. 2. Mild degenerative change and olecranon spur. Electronically Signed   By: Narda Rutherford M.D.   On: 10/11/2022 23:32   CT HEAD WO CONTRAST ( )  Result Date: 10/11/2022 CLINICAL DATA:  Fall at home. EXAM: CT HEAD WITHOUT CONTRAST TECHNIQUE: Contiguous axial images were obtained from the base of the skull through the vertex without intravenous contrast. RADIATION DOSE REDUCTION: This exam was performed according to the departmental dose-optimization program which includes automated exposure control, adjustment of the mA and/or kV according to patient size and/or use of iterative reconstruction technique. COMPARISON:  MRI head 08/15/2021 and CT head 08/15/2021 FINDINGS: Brain: No intracranial hemorrhage, mass effect, or evidence of acute infarct. No hydrocephalus. No extra-axial fluid collection. Generalized cerebral atrophy. Ill-defined hypoattenuation within the cerebral white matter is nonspecific but consistent with chronic small vessel ischemic disease. Vascular: No hyperdense vessel. Intracranial arterial calcification. Skull: No fracture or focal lesion. Sinuses/Orbits: No acute finding. Paranasal sinuses and mastoid air cells are well aerated. Other: None. IMPRESSION: 1. No evidence of acute intracranial abnormality. 2. Generalized cerebral atrophy and chronic small vessel ischemic disease. Electronically Signed   By: Minerva Fester M.D.   On: 10/11/2022 23:10    Microbiology: Results for orders placed or performed in visit on 08/19/22  Microscopic Examination     Status: Abnormal   Collection  Time: 08/19/22  2:16 PM   Urine  Result Value Ref Range Status   WBC, UA 6-10 (A) 0 - 5 /hpf Final   RBC, Urine 0-2 0 - 2 /hpf Final   Epithelial Cells (non renal) 0-10 0 - 10 /hpf Final   Casts Present (A) None seen /lpf Final   Cast Type Hyaline casts N/A Final   Mucus, UA Present (A) Not Estab. Final   Bacteria, UA Moderate (A) None seen/Few Final  CULTURE, URINE COMPREHENSIVE     Status: Abnormal   Collection Time: 08/19/22  3:44 PM   Specimen: Urine   UR  Result Value Ref Range Status   Urine Culture, Comprehensive Final report (A)  Final   Organism ID,  Bacteria Comment (A)  Final    Comment: Escherichia coli, identified by an automated biochemical system. Cefazolin <=4 ug/mL Cefazolin with an MIC <=16 predicts susceptibility to the oral agents cefaclor, cefdinir, cefpodoxime, cefprozil, cefuroxime, cephalexin, and loracarbef when used for therapy of uncomplicated urinary tract infections due to E. coli, Klebsiella pneumoniae, and Proteus mirabilis. 10,000-25,000 colony forming units per mL    ANTIMICROBIAL SUSCEPTIBILITY Comment  Final    Comment:       ** S = Susceptible; I = Intermediate; R = Resistant **                    P = Positive; N = Negative             MICS are expressed in micrograms per mL    Antibiotic                 RSLT#1    RSLT#2    RSLT#3    RSLT#4 Amoxicillin/Clavulanic Acid    S Ampicillin                     S Cefepime                       S Ceftriaxone                    S Cefuroxime                     S Ciprofloxacin                  S Ertapenem                      S Gentamicin                     S Imipenem                       S Levofloxacin                   S Meropenem                      S Nitrofurantoin                 S Piperacillin/Tazobactam        S Tetracycline                   S Tobramycin                     S Trimethoprim/Sulfa             S     Labs: CBC: Recent Labs  Lab 10/11/22 2245 10/12/22 0616  10/13/22 0451 10/14/22 0504  WBC 11.0* 12.6* 9.1 9.6  NEUTROABS 8.6*  --  6.4 6.3  HGB 11.6* 11.5* 10.4* 10.6*  HCT 35.2* 34.8* 31.1* 31.8*  MCV 97.2 95.1 95.4 95.2  PLT 212 192 175 171   Basic Metabolic Panel: Recent Labs  Lab 10/11/22 2245 10/12/22 0616 10/13/22 0451 10/14/22 0504  NA 134*  --  136 133*  K 4.2  --  4.1 3.8  CL 103  --  108 105  CO2 22  --  23 22  GLUCOSE 101*  --  112* 114*  BUN 44*  --  38* 36*  CREATININE 1.62* 1.33* 1.21 1.22  CALCIUM 8.7*  --  7.8* 8.3*   Liver Function Tests: Recent Labs  Lab 10/11/22 2245  AST 26  ALT 28  ALKPHOS 60  BILITOT 0.8  PROT 6.9  ALBUMIN 4.0   CBG: Recent Labs  Lab 10/12/22 2126  GLUCAP 110*    Discharge time spent: less than 30 minutes.  Signed: Pennie Banter, DO [facility] 10/14/2022

## 2022-10-14 NOTE — TOC Progression Note (Signed)
Transition of Care [facility]) - Progression Note    Patient Details  Name: Richard Castaneda MRN: [id number] Date of Birth: 02-Sep-1929  Transition of [facility]) CM/SW Contact  Marlowe Sax, RN Phone Number: 10/14/2022, 10:25 AM  Clinical Narrative:    Ins approved to go to [facility] to transport I called [name] the patient's son and let him know Ref number [id number] 6/12-6/14   Expected Discharge Plan: Skilled Nursing Facility Barriers to Discharge: Continued Medical Work up  Expected Discharge Plan and Services   Discharge Planning Services: CM Consult Post Acute Care Choice: Home Health Living arrangements for the past 2 months: Single Family Home                 DME Arranged: Scientific laboratory technician, Walker rolling DME Agency: [employer]       HH Arranged: PT, OT HH Agency: [facility] Date HH Agency Contacted: 10/12/22 Time HH Agency Contacted: 1135 Representative spoke with at [facility] Agency: [place]   Social Determinants of Health (SDOH) Interventions SDOH Screenings   Tobacco Use: Low Risk  (10/11/2022)    Readmission Risk Interventions    08/15/2021    3:18 PM  Readmission Risk Prevention Plan  Post Dischage Appt Complete  Medication Screening Complete  Transportation Screening Complete

## 2022-10-21 NOTE — Progress Notes (Signed)
Read my note for clinical indication. Listed clearly.

## 2022-12-15 ENCOUNTER — Emergency Department
Admission: EM | Admit: 2022-12-15 | Discharge: 2022-12-15 | Disposition: A | Discharge status: Home / Self Care | Payer: Medicare Other | Attending: Emergency Medicine | Admitting: Emergency Medicine

## 2022-12-15 ENCOUNTER — Emergency Department: Payer: Medicare Other

## 2022-12-15 ENCOUNTER — Other Ambulatory Visit: Payer: Self-pay

## 2022-12-15 DIAGNOSIS — F03A0 Unspecified dementia, mild, without behavioral disturbance, psychotic disturbance, mood disturbance, and anxiety: Secondary | ICD-10-CM | POA: Diagnosis not present

## 2022-12-15 DIAGNOSIS — M545 Low back pain, unspecified: Secondary | ICD-10-CM | POA: Insufficient documentation

## 2022-12-15 DIAGNOSIS — I700 Atherosclerosis of aorta: Secondary | ICD-10-CM | POA: Insufficient documentation

## 2022-12-15 DIAGNOSIS — S32019S Unspecified fracture of first lumbar vertebra, sequela: Secondary | ICD-10-CM | POA: Insufficient documentation

## 2022-12-15 DIAGNOSIS — N281 Cyst of kidney, acquired: Secondary | ICD-10-CM | POA: Diagnosis not present

## 2022-12-15 DIAGNOSIS — W19XXXS Unspecified fall, sequela: Secondary | ICD-10-CM | POA: Insufficient documentation

## 2022-12-15 DIAGNOSIS — N183 Chronic kidney disease, stage 3 unspecified: Secondary | ICD-10-CM | POA: Insufficient documentation

## 2022-12-15 DIAGNOSIS — S32010S Wedge compression fracture of first lumbar vertebra, sequela: Secondary | ICD-10-CM

## 2022-12-15 DIAGNOSIS — I2510 Atherosclerotic heart disease of native coronary artery without angina pectoris: Secondary | ICD-10-CM | POA: Diagnosis not present

## 2022-12-15 DIAGNOSIS — I129 Hypertensive chronic kidney disease with stage 1 through stage 4 chronic kidney disease, or unspecified chronic kidney disease: Secondary | ICD-10-CM | POA: Diagnosis not present

## 2022-12-15 DIAGNOSIS — R296 Repeated falls: Secondary | ICD-10-CM | POA: Insufficient documentation

## 2022-12-15 DIAGNOSIS — M8588 Other specified disorders of bone density and structure, other site: Secondary | ICD-10-CM | POA: Insufficient documentation

## 2022-12-15 DIAGNOSIS — S3992XS Unspecified injury of lower back, sequela: Secondary | ICD-10-CM | POA: Diagnosis present

## 2022-12-15 LAB — BASIC METABOLIC PANEL
Anion gap: 7 (ref 5–15)
BUN: 27 mg/dL — ABNORMAL HIGH (ref 8–23)
CO2: 26 mmol/L (ref 22–32)
Calcium: 8.8 mg/dL — ABNORMAL LOW (ref 8.9–10.3)
Chloride: 103 mmol/L (ref 98–111)
Creatinine, Ser: 1.03 mg/dL (ref 0.61–1.24)
GFR, Estimated: >60 mL/min (ref >60)
Glucose, Bld: 106 mg/dL — ABNORMAL HIGH (ref 70–99)
Potassium: 4.3 mmol/L (ref 3.5–5.1)
Sodium: 136 mmol/L (ref 135–145)

## 2022-12-15 LAB — CBG MONITORING, ED: Glucose-Capillary: 102 mg/dL — ABNORMAL HIGH (ref 70–99)

## 2022-12-15 LAB — URINALYSIS, ROUTINE W REFLEX MICROSCOPIC
Bilirubin Urine: NEGATIVE
Glucose, UA: NEGATIVE mg/dL
Ketones, ur: NEGATIVE mg/dL
Leukocytes,Ua: NEGATIVE
Nitrite: NEGATIVE
Protein, ur: 30 mg/dL — AB
Specific Gravity, Urine: 1.019 (ref 1.005–1.030)
pH: 6 (ref 5.0–8.0)

## 2022-12-15 LAB — CBC
HCT: 40.6 % (ref 39.0–52.0)
Hemoglobin: 13 g/dL (ref 13.0–17.0)
MCH: 31.3 pg (ref 26.0–34.0)
MCHC: 32 g/dL (ref 30.0–36.0)
MCV: 97.8 fL (ref 80.0–100.0)
Platelets: 269 10*3/uL (ref 150–400)
RBC: 4.15 MIL/uL — ABNORMAL LOW (ref 4.22–5.81)
RDW: 12.7 % (ref 11.5–15.5)
WBC: 9.8 10*3/uL (ref 4.0–10.5)
nRBC: 0 % (ref 0.0–0.2)

## 2022-12-15 MED ORDER — ACETAMINOPHEN 325 MG PO TABS
650.0000 mg | ORAL_TABLET | Freq: Once | ORAL | Status: DC
  Filled 2022-12-15: qty 2

## 2022-12-15 MED ORDER — METHOCARBAMOL 500 MG PO TABS
500.0000 mg | ORAL_TABLET | Freq: Once | ORAL | Status: AC
  Administered 2022-12-15: 500 mg via ORAL
  Filled 2022-12-15: qty 1

## 2022-12-15 MED ORDER — METHOCARBAMOL 500 MG PO TABS: 500.0000 mg | ORAL_TABLET | Freq: Three times a day (TID) | ORAL | 0 refills | Status: DC | PRN

## 2022-12-15 MED ORDER — LABETALOL HCL 5 MG/ML IV SOLN
5.0000 mg | Freq: Once | INTRAVENOUS | Status: AC
  Administered 2022-12-15: 5 mg via INTRAVENOUS
  Filled 2022-12-15: qty 4

## 2022-12-15 MED ORDER — IOHEXOL 300 MG/ML  SOLN
100.0000 mL | Freq: Once | INTRAMUSCULAR | Status: AC | PRN
  Administered 2022-12-15: 100 mL via INTRAVENOUS

## 2022-12-15 NOTE — Discharge Instructions (Signed)
You were seen in the emergency department today for your fall.  CT imaging does not show any acute new fractures.  It appears that majority of injuries look chronic or old in nature.  Please continue taking Tylenol as needed up with pain symptoms.  Please continue working with your physical therapist.  Please follow-up with your primary care provider in the next week for reassessment.

## 2022-12-15 NOTE — ED Provider Notes (Signed)
[facility] Provider Note    Event Date/Time   First MD Initiated Contact with Patient 12/15/22 [phone]     (approximate)   History   Back Pain and Hypertension   HPI Richard Castaneda is a 89 y.o. male with CAD, HTN, HLD, CKD stage III, mild dementia who presents today for left flank pain.  Patient reportedly had a fall a couple days ago and states he was evaluated at a hospital and discharged.  He has noticed pain in his left flank since then with movement.  He denies any new falls since that episode.  He denies fever, chills, chest pain, abdominal pain, nausea, vomiting, shortness of breath.  He denies any pain in his extremities.  No numbness anywhere.  Patient uses a walker for ambulation.     Physical Exam   Triage Vital Signs: ED Triage Vitals [12/15/22 0950]  Encounter Vitals Group     BP      Systolic BP Percentile      Diastolic BP Percentile      Pulse      Resp      Temp      Temp src      SpO2      Weight      Height      Head Circumference      Peak Flow      Pain Score 5     Pain Loc      Pain Education      Exclude from Growth Chart     Most recent vital signs: Vitals:   12/15/22 1004 12/15/22 1130  BP: (!) 192/82 (!) 152/84  Pulse: 77 71  Resp: 18 17  Temp: 98 F (36.7 C)   SpO2: 95% 94%    Physical Exam: I have reviewed the vital signs and nursing notes. General: Awake, alert, no acute distress.  Nontoxic appearing. Head:  Atraumatic, normocephalic.   ENT:  EOM intact, PERRL. Oral mucosa is pink and moist with no lesions. Neck: Neck is supple with full range of motion, No meningeal signs. Cardiovascular:  RRR, No murmurs. Peripheral pulses palpable and equal bilaterally. Respiratory:  Symmetrical chest wall expansion.  No rhonchi, rales, or wheezes.  Good air movement throughout.  No use of accessory muscles.   Musculoskeletal:  No cyanosis or edema. Moving extremities with full ROM.  No C, T, or L-spine tenderness to  palpation.  Mild tenderness palpation of the left paraspinal region.  Negative logroll testing to bilateral lower extremities.  No tenderness to palpation throughout entire bilateral upper and lower extremities. Abdomen:  Soft, nontender, nondistended. Neuro:  GCS 15, moving all four extremities, interacting appropriately. Speech clear. Psych:  Calm, appropriate.   Skin:  Warm, dry, no rash.  No obvious bruising noted throughout trunk.    ED Results / Procedures / Treatments   Labs (all labs ordered are listed, but only abnormal results are displayed) Labs Reviewed  BASIC METABOLIC PANEL - Abnormal; Notable for the following components:      Result Value   Glucose, Bld 106 (*)    BUN 27 (*)    Calcium 8.8 (*)    All other components within normal limits  CBC - Abnormal; Notable for the following components:   RBC 4.15 (*)    All other components within normal limits  URINALYSIS, ROUTINE W REFLEX MICROSCOPIC - Abnormal; Notable for the following components:   Color, Urine STRAW (*)    APPearance CLEAR (*)  Hgb urine dipstick MODERATE (*)    Protein, ur 30 (*)    Bacteria, UA RARE (*)    All other components within normal limits  CBG MONITORING, ED - Abnormal; Notable for the following components:   Glucose-Capillary 102 (*)    All other components within normal limits     EKG My EKG interpretation at 955: Rate of 72, atrial fibrillation.  Left axis deviation.  No acute ST elevations or depressions   RADIOLOGY CT imaging of head and C-spine does not reveal any acute abnormalities per my interpretation.  See radiology read for separate reads.   PROCEDURES:  Critical Care performed: No  Procedures   MEDICATIONS ORDERED IN ED: Medications  acetaminophen (TYLENOL) tablet 650 mg (0 mg Oral Hold 12/15/22 1352)  iohexol (OMNIPAQUE) 300 MG/ML solution 100 mL (100 mLs Intravenous Contrast Given 12/15/22 1109)  methocarbamol (ROBAXIN) tablet 500 mg (500 mg Oral Given 12/15/22  1349)  labetalol (NORMODYNE) injection 5 mg (5 mg Intravenous Given 12/15/22 1425)     IMPRESSION / MDM / ASSESSMENT AND PLAN / ED COURSE  I reviewed the triage vital signs and the nursing notes.                              Differential diagnosis includes, but is not limited to, ICH, L-spine injury, intra-abdominal hematoma, left hip or pelvis fracture.  Patient's presentation is most consistent with acute presentation with potential threat to life or bodily function.  Patient is a 89 year old male presenting today for left lower back pain in the setting of a mechanical fall 2 days ago that he did not previously get evaluation.  He did not remember the entire event and had vague pain complaints so CT imaging was performed to rule out traumatic injuries.  Laboratory workup otherwise reassuring.  CT imaging reveals what appears almost entirely chronic or old injuries.  Palpation of the C, T, and L-spine showed no tenderness palpation so I expect these findings are nonacute.  The rest of his injuries seen on CT all appear old as they are not in relation to any of his current pain complaints.  I discussed the case with his daughter who would not wish for patient to be in a back brace as this would limit his mobility over concerns of possible L1 compression fracture.  She felt comfortable with him going home at this time and did not need further evaluation with MRIs.  Patient was stable for discharge and they were given strict return precautions.  The patient is on the cardiac monitor to evaluate for evidence of arrhythmia and/or significant heart rate changes. Clinical Course as of 12/15/22 1629  Tue Dec 15, 2022  1032 Additional history obtained by daughter at bedside.  Patient had a fall 2 days ago.  He was complaining of left flank pain at that time but has been able to ambulate since.  The fall occurred while using his walker with the caregiver holding onto him.  He tripped and had no loss of  consciousness.  He refused to get evaluated at that time and seemed fine until pain worsened this morning which prompted their visit to the emergency department.  Family has not noted any other changes with him aside from him complaining of the left-sided flank pain. [DW]  1033 CBC(!) unremarkable [DW]  1034 Basic metabolic panel(!) Largely unremarkable [DW]  1114 My EKG interpretation: Rate of 72, atrial fibrillation.  Left  anterior fascicular block.  No acute ST elevations or depressions. [DW]  1157 Urinalysis, Routine w reflex microscopic -Urine, Random(!) Negative for UTI [DW]  1203 CT HEAD WO CONTRAST ( ) No acute intracranial pathology per my interpretation [DW]  1203 CT Cervical Spine Wo Contrast Radiologist notes possible acute compression fracture of C7 and T1.  I do not note any tenderness palpation on my exam there. [DW]  1337 Discussed CT imaging findings with patient and daughter.  Majority of the findings look chronic or nonacute in nature.  Patient was repalpated all over the spots including his cervical spine and lumbar spine with no tenderness to palpation at either site.  I suspect neither of these are acute.  Daughter also did not want further imaging of these issues. She did not wish for a back brace with the L1 compression fracture on indeterminate age [DW]    Clinical Course User Index [DW] Janith Lima, MD     FINAL CLINICAL IMPRESSION(S) / ED DIAGNOSES   Final diagnoses:  Acute left-sided low back pain without sciatica  Multiple falls  Closed compression fracture of L1 vertebra, sequela     Rx / DC Orders   ED Discharge Orders          Ordered    methocarbamol (ROBAXIN) 500 MG tablet  Every 8 hours PRN        12/15/22 1341             Note:  This document was prepared using Dragon voice recognition software and may include unintentional dictation errors.   Janith Lima, MD 12/15/22 [phone]

## 2022-12-15 NOTE — ED Notes (Signed)
Report given to [facility]

## 2022-12-15 NOTE — ED Triage Notes (Signed)
Pt comes via EMs from home with c/o back pain. Pt states left flank. Pt was htn 200/100. Pt hasn't taken meds since he hasn't eaten. Pt did have fall 3 days ago and seen in ED and evaluated then DC.  Pt able to stand but uses wheelchair and walker.   O2-89% RA, pt placed on 2L and O2 to 96%  Pt is A*O and at baseline. Pt does have dementia.

## 2022-12-15 NOTE — ED Notes (Signed)
Pts BP is elevated, MD stated it was okay for Pt to take his home medication. Home meds administered by daughter @bedside . Pt swallowed appropriately, will continue to monitor

## 2022-12-15 NOTE — ED Notes (Signed)
Pt had saturated brief and chux. Pt cleaned up and clean gown clux and brief placed by this RN and EDT Heidy. Pt pulled up in bed and given warm blankets.

## 2022-12-15 NOTE — ED Notes (Signed)
Family at bedside and states pt did fall but was never evaluated bc pt refused. Pt was watched closely and they thought it might be muscular. Will inform MD Anner Crete

## 2022-12-24 ENCOUNTER — Other Ambulatory Visit: Payer: Self-pay

## 2022-12-24 ENCOUNTER — Emergency Department: Payer: Medicare Other

## 2022-12-24 ENCOUNTER — Inpatient Hospital Stay
Admission: EM | Admit: 2022-12-24 | Discharge: 2022-12-28 | DRG: 312 | Disposition: A | Discharge status: Hospice | Payer: Medicare Other | Attending: Internal Medicine | Admitting: Internal Medicine

## 2022-12-24 DIAGNOSIS — D72829 Elevated white blood cell count, unspecified: Secondary | ICD-10-CM | POA: Diagnosis not present

## 2022-12-24 DIAGNOSIS — Z9841 Cataract extraction status, right eye: Secondary | ICD-10-CM

## 2022-12-24 DIAGNOSIS — I2510 Atherosclerotic heart disease of native coronary artery without angina pectoris: Secondary | ICD-10-CM | POA: Diagnosis present

## 2022-12-24 DIAGNOSIS — N1831 Chronic kidney disease, stage 3a: Secondary | ICD-10-CM | POA: Diagnosis present

## 2022-12-24 DIAGNOSIS — G20A1 Parkinson's disease without dyskinesia, without mention of fluctuations: Secondary | ICD-10-CM | POA: Diagnosis present

## 2022-12-24 DIAGNOSIS — I951 Orthostatic hypotension: Secondary | ICD-10-CM | POA: Diagnosis not present

## 2022-12-24 DIAGNOSIS — N179 Acute kidney failure, unspecified: Secondary | ICD-10-CM | POA: Diagnosis present

## 2022-12-24 DIAGNOSIS — R296 Repeated falls: Secondary | ICD-10-CM | POA: Diagnosis present

## 2022-12-24 DIAGNOSIS — R251 Tremor, unspecified: Secondary | ICD-10-CM

## 2022-12-24 DIAGNOSIS — R7303 Prediabetes: Secondary | ICD-10-CM | POA: Diagnosis present

## 2022-12-24 DIAGNOSIS — N401 Enlarged prostate with lower urinary tract symptoms: Secondary | ICD-10-CM | POA: Diagnosis present

## 2022-12-24 DIAGNOSIS — Z961 Presence of intraocular lens: Secondary | ICD-10-CM | POA: Diagnosis present

## 2022-12-24 DIAGNOSIS — Z96652 Presence of left artificial knee joint: Secondary | ICD-10-CM | POA: Diagnosis present

## 2022-12-24 DIAGNOSIS — E440 Moderate protein-calorie malnutrition: Secondary | ICD-10-CM | POA: Diagnosis present

## 2022-12-24 DIAGNOSIS — I252 Old myocardial infarction: Secondary | ICD-10-CM

## 2022-12-24 DIAGNOSIS — R55 Syncope and collapse: Secondary | ICD-10-CM | POA: Diagnosis not present

## 2022-12-24 DIAGNOSIS — Z9842 Cataract extraction status, left eye: Secondary | ICD-10-CM

## 2022-12-24 DIAGNOSIS — Z79899 Other long term (current) drug therapy: Secondary | ICD-10-CM

## 2022-12-24 DIAGNOSIS — T446X5A Adverse effect of alpha-adrenoreceptor antagonists, initial encounter: Secondary | ICD-10-CM | POA: Diagnosis present

## 2022-12-24 DIAGNOSIS — Z79890 Hormone replacement therapy (postmenopausal): Secondary | ICD-10-CM

## 2022-12-24 DIAGNOSIS — Z66 Do not resuscitate: Secondary | ICD-10-CM | POA: Diagnosis present

## 2022-12-24 DIAGNOSIS — W19XXXA Unspecified fall, initial encounter: Secondary | ICD-10-CM

## 2022-12-24 DIAGNOSIS — I081 Rheumatic disorders of both mitral and tricuspid valves: Secondary | ICD-10-CM | POA: Diagnosis present

## 2022-12-24 DIAGNOSIS — E039 Hypothyroidism, unspecified: Secondary | ICD-10-CM | POA: Diagnosis present

## 2022-12-24 DIAGNOSIS — Z841 Family history of disorders of kidney and ureter: Secondary | ICD-10-CM

## 2022-12-24 DIAGNOSIS — F22 Delusional disorders: Secondary | ICD-10-CM | POA: Diagnosis present

## 2022-12-24 DIAGNOSIS — Z8042 Family history of malignant neoplasm of prostate: Secondary | ICD-10-CM

## 2022-12-24 DIAGNOSIS — I129 Hypertensive chronic kidney disease with stage 1 through stage 4 chronic kidney disease, or unspecified chronic kidney disease: Secondary | ICD-10-CM | POA: Diagnosis present

## 2022-12-24 DIAGNOSIS — I739 Peripheral vascular disease, unspecified: Secondary | ICD-10-CM | POA: Diagnosis present

## 2022-12-24 DIAGNOSIS — Z955 Presence of coronary angioplasty implant and graft: Secondary | ICD-10-CM

## 2022-12-24 DIAGNOSIS — E7800 Pure hypercholesterolemia, unspecified: Secondary | ICD-10-CM | POA: Diagnosis present

## 2022-12-24 DIAGNOSIS — N183 Chronic kidney disease, stage 3 unspecified: Secondary | ICD-10-CM | POA: Diagnosis present

## 2022-12-24 DIAGNOSIS — R350 Frequency of micturition: Secondary | ICD-10-CM | POA: Diagnosis present

## 2022-12-24 DIAGNOSIS — Y92009 Unspecified place in unspecified non-institutional (private) residence as the place of occurrence of the external cause: Secondary | ICD-10-CM

## 2022-12-24 DIAGNOSIS — Z6823 Body mass index (BMI) 23.0-23.9, adult: Secondary | ICD-10-CM

## 2022-12-24 DIAGNOSIS — Z888 Allergy status to other drugs, medicaments and biological substances status: Secondary | ICD-10-CM

## 2022-12-24 DIAGNOSIS — I10 Essential (primary) hypertension: Secondary | ICD-10-CM | POA: Diagnosis present

## 2022-12-24 DIAGNOSIS — Z7982 Long term (current) use of aspirin: Secondary | ICD-10-CM

## 2022-12-24 LAB — URINALYSIS, ROUTINE W REFLEX MICROSCOPIC
Bacteria, UA: NONE SEEN
Bilirubin Urine: NEGATIVE
Glucose, UA: NEGATIVE mg/dL
Ketones, ur: NEGATIVE mg/dL
Leukocytes,Ua: NEGATIVE
Nitrite: NEGATIVE
Protein, ur: 30 mg/dL — AB
RBC / HPF: >50 RBC/hpf (ref 0–5)
Specific Gravity, Urine: 1.029 (ref 1.005–1.030)
pH: 7 (ref 5.0–8.0)

## 2022-12-24 LAB — CBC
HCT: 40.7 % (ref 39.0–52.0)
Hemoglobin: 13.2 g/dL (ref 13.0–17.0)
MCH: 31.8 pg (ref 26.0–34.0)
MCHC: 32.4 g/dL (ref 30.0–36.0)
MCV: 98.1 fL (ref 80.0–100.0)
Platelets: 395 10*3/uL (ref 150–400)
RBC: 4.15 MIL/uL — ABNORMAL LOW (ref 4.22–5.81)
RDW: 12.6 % (ref 11.5–15.5)
WBC: 11.2 10*3/uL — ABNORMAL HIGH (ref 4.0–10.5)
nRBC: 0 % (ref 0.0–0.2)

## 2022-12-24 LAB — BASIC METABOLIC PANEL
Anion gap: 11 (ref 5–15)
BUN: 27 mg/dL — ABNORMAL HIGH (ref 8–23)
CO2: 20 mmol/L — ABNORMAL LOW (ref 22–32)
Calcium: 8.6 mg/dL — ABNORMAL LOW (ref 8.9–10.3)
Chloride: 101 mmol/L (ref 98–111)
Creatinine, Ser: 1.32 mg/dL — ABNORMAL HIGH (ref 0.61–1.24)
GFR, Estimated: 51 mL/min — ABNORMAL LOW (ref >60)
Glucose, Bld: 133 mg/dL — ABNORMAL HIGH (ref 70–99)
Potassium: 4 mmol/L (ref 3.5–5.1)
Sodium: 132 mmol/L — ABNORMAL LOW (ref 135–145)

## 2022-12-24 LAB — HEPATIC FUNCTION PANEL
ALT: 22 U/L (ref 0–44)
AST: 28 U/L (ref 15–41)
Albumin: 3.6 g/dL (ref 3.5–5.0)
Alkaline Phosphatase: 91 U/L (ref 38–126)
Bilirubin, Direct: 0.2 mg/dL (ref 0.0–0.2)
Indirect Bilirubin: 0.7 mg/dL (ref 0.3–0.9)
Total Bilirubin: 0.9 mg/dL (ref 0.3–1.2)
Total Protein: 6.9 g/dL (ref 6.5–8.1)

## 2022-12-24 LAB — TSH: TSH: 10.455 u[IU]/mL — ABNORMAL HIGH (ref 0.350–4.500)

## 2022-12-24 LAB — SAMPLE TO BLOOD BANK

## 2022-12-24 LAB — TROPONIN I (HIGH SENSITIVITY)
Troponin I (High Sensitivity): 22 ng/L — ABNORMAL HIGH (ref <18)
Troponin I (High Sensitivity): 21 ng/L — ABNORMAL HIGH (ref <18)

## 2022-12-24 MED ORDER — MIRABEGRON ER 25 MG PO TB24
25.0000 mg | ORAL_TABLET | Freq: Every day | ORAL | Status: DC
Start: 2022-12-24 — End: 2022-12-24

## 2022-12-24 MED ORDER — SODIUM CHLORIDE 0.9% FLUSH
3.0000 mL | Freq: Two times a day (BID) | INTRAVENOUS | Status: DC
  Administered 2022-12-24 – 2022-12-28 (×6): 3 mL via INTRAVENOUS

## 2022-12-24 MED ORDER — SIMVASTATIN 20 MG PO TABS
10.0000 mg | ORAL_TABLET | Freq: Every day | ORAL | Status: DC
  Administered 2022-12-24 – 2022-12-27 (×4): 10 mg via ORAL
  Filled 2022-12-24 (×4): qty 1

## 2022-12-24 MED ORDER — METHOCARBAMOL 500 MG PO TABS: 500.0000 mg | ORAL_TABLET | Freq: Three times a day (TID) | ORAL | Status: DC | PRN

## 2022-12-24 MED ORDER — ENOXAPARIN SODIUM 40 MG/0.4ML IJ SOSY
40.0000 mg | PREFILLED_SYRINGE | INTRAMUSCULAR | Status: DC
  Administered 2022-12-24 – 2022-12-27 (×4): 40 mg via SUBCUTANEOUS
  Filled 2022-12-24 (×4): qty 0.4

## 2022-12-24 MED ORDER — ACETAMINOPHEN 325 MG PO TABS
650.0000 mg | ORAL_TABLET | Freq: Three times a day (TID) | ORAL | Status: DC
  Administered 2022-12-24 – 2022-12-28 (×12): 650 mg via ORAL
  Filled 2022-12-24 (×12): qty 2

## 2022-12-24 MED ORDER — FINASTERIDE 5 MG PO TABS: 5.0000 mg | ORAL_TABLET | Freq: Every day | ORAL | Status: DC

## 2022-12-24 MED ORDER — LEVOTHYROXINE SODIUM 50 MCG PO TABS
175.0000 ug | ORAL_TABLET | Freq: Every day | ORAL | Status: DC
  Administered 2022-12-25: 175 ug via ORAL
  Filled 2022-12-24: qty 1

## 2022-12-24 MED ORDER — IOHEXOL 350 MG/ML SOLN
100.0000 mL | Freq: Once | INTRAVENOUS | Status: AC | PRN
  Administered 2022-12-24: 100 mL via INTRAVENOUS

## 2022-12-24 MED ORDER — HYDRALAZINE HCL 20 MG/ML IJ SOLN
10.0000 mg | Freq: Four times a day (QID) | INTRAMUSCULAR | Status: DC | PRN
  Administered 2022-12-24: 10 mg via INTRAVENOUS
  Filled 2022-12-24: qty 1

## 2022-12-24 MED ORDER — ASPIRIN 81 MG PO TBEC: 81.0000 mg | DELAYED_RELEASE_TABLET | Freq: Every day | ORAL | Status: DC

## 2022-12-24 MED ORDER — POLYVINYL ALCOHOL 1.4 % OP SOLN: 1.0000 [drp] | OPHTHALMIC | Status: DC | PRN

## 2022-12-24 MED ORDER — LACTATED RINGERS IV BOLUS
1000.0000 mL | Freq: Once | INTRAVENOUS | Status: AC
  Administered 2022-12-24: 1000 mL via INTRAVENOUS

## 2022-12-24 MED ORDER — SODIUM CHLORIDE 0.9 % IV SOLN: INTRAVENOUS | Status: AC

## 2022-12-24 MED ORDER — MIRABEGRON ER 25 MG PO TB24
25.0000 mg | ORAL_TABLET | Freq: Every day | ORAL | Status: DC
  Administered 2022-12-25 – 2022-12-28 (×4): 25 mg via ORAL
  Filled 2022-12-24 (×4): qty 1

## 2022-12-24 MED ORDER — FERROUS SULFATE 325 (65 FE) MG PO TABS: 325.0000 mg | ORAL_TABLET | Freq: Every day | ORAL | Status: DC

## 2022-12-24 MED ORDER — HYDRALAZINE HCL 50 MG PO TABS: 50.0000 mg | ORAL_TABLET | Freq: Four times a day (QID) | ORAL | Status: DC | PRN

## 2022-12-24 MED ORDER — CARBIDOPA-LEVODOPA 10-100 MG PO TABS
1.0000 | ORAL_TABLET | Freq: Three times a day (TID) | ORAL | Status: DC
  Administered 2022-12-25 – 2022-12-27 (×8): 1 via ORAL
  Filled 2022-12-24 (×8): qty 1

## 2022-12-24 MED ORDER — CARBIDOPA-LEVODOPA 10-100 MG PO TABS: 1.0000 | ORAL_TABLET | Freq: Three times a day (TID) | ORAL | Status: DC

## 2022-12-24 MED ORDER — SIMVASTATIN 10 MG PO TABS: 10.0000 mg | ORAL_TABLET | Freq: Every day | ORAL | Status: DC

## 2022-12-24 MED ORDER — ASPIRIN 81 MG PO TBEC
81.0000 mg | DELAYED_RELEASE_TABLET | Freq: Every day | ORAL | Status: DC
  Administered 2022-12-25 – 2022-12-28 (×4): 81 mg via ORAL
  Filled 2022-12-24 (×4): qty 1

## 2022-12-24 MED ORDER — FINASTERIDE 5 MG PO TABS
5.0000 mg | ORAL_TABLET | Freq: Every day | ORAL | Status: DC
  Administered 2022-12-25 – 2022-12-28 (×4): 5 mg via ORAL
  Filled 2022-12-24 (×4): qty 1

## 2022-12-24 MED ORDER — CIPROFLOXACIN HCL 0.3 % OP SOLN
1.0000 [drp] | OPHTHALMIC | Status: DC
  Administered 2022-12-24 – 2022-12-28 (×17): 1 [drp] via OPHTHALMIC
  Filled 2022-12-24 (×2): qty 2.5

## 2022-12-24 NOTE — ED Notes (Signed)
Dr. Chipper Herb at bedside. Notified that orthostatic VS would not be possible at this time r/t pt's current condition and cognitive status.

## 2022-12-24 NOTE — Plan of Care (Signed)
  Problem: Education: Goal: Knowledge of condition and prescribed therapy will improve Outcome: Progressing   Problem: Cardiac: Goal: Will achieve and/or maintain adequate cardiac output Outcome: Progressing   Problem: Physical Regulation: Goal: Complications related to the disease process, condition or treatment will be avoided or minimized Outcome: Progressing   Problem: Education: Goal: Knowledge of General Education information will improve Description: Including pain rating scale, medication(s)/side effects and non-pharmacologic comfort measures Outcome: Progressing   Problem: Health Behavior/Discharge Planning: Goal: Ability to manage health-related needs will improve Outcome: Progressing   Problem: Clinical Measurements: Goal: Ability to maintain clinical measurements within normal limits will improve Outcome: Progressing Goal: Will remain free from infection Outcome: Progressing Goal: Diagnostic test results will improve Outcome: Progressing Goal: Respiratory complications will improve Outcome: Progressing Goal: Cardiovascular complication will be avoided Outcome: Progressing   Problem: Pain Managment: Goal: General experience of comfort will improve Outcome: Progressing

## 2022-12-24 NOTE — Plan of Care (Signed)
  Problem: Education: Goal: Knowledge of condition and prescribed therapy will improve Outcome: Progressing   Problem: Cardiac: Goal: Will achieve and/or maintain adequate cardiac output Outcome: Progressing   Problem: Physical Regulation: Goal: Complications related to the disease process, condition or treatment will be avoided or minimized Outcome: Progressing   Problem: Education: Goal: Knowledge of General Education information will improve Description: Including pain rating scale, medication(s)/side effects and non-pharmacologic comfort measures Outcome: Progressing   Problem: Nutrition: Goal: Adequate nutrition will be maintained Outcome: Progressing   Problem: Skin Integrity: Goal: Risk for impaired skin integrity will decrease Outcome: Progressing

## 2022-12-24 NOTE — ED Provider Notes (Signed)
[facility] Provider Note    Event Date/Time   First MD Initiated Contact with Patient 12/24/22 [phone]     (approximate)   History   Chief Complaint Syncope  HPI  Richard Castaneda is a 89 y.o. male with past medical history of hypertension, CAD, CKD, peripheral vascular disease, and hypothyroidism who presents to the ED complaining of syncope.  Per son at bedside, patient was helped to the bathroom earlier this morning, but when the son went to check on him, he was slumped over against the wall while still sitting on the toilet.  Son tried to get the patient to respond for about 5 minutes, eventually called EMS when he did not come around.  When patient arrived with EMS, he was reportedly awake and alert, answering questions appropriately.  He then had another episode of unresponsiveness in triage and was immediately brought back to a room.  Patient now keeping his eyes closed, but able to answer yes or no questions and state his name.  He currently denies any complaints.  EMS reports that patient had significantly lower blood pressure in his right arm compared to the left.     Physical Exam   Triage Vital Signs: ED Triage Vitals  Encounter Vitals Group     BP 12/24/22 0933 (!) 200/106     Systolic BP Percentile --      Diastolic BP Percentile --      Pulse --      Resp --      Temp --      Temp src --      SpO2 --      Weight --      Height --      Head Circumference --      Peak Flow --      Pain Score 12/24/22 0931 0     Pain Loc --      Pain Education --      Exclude from Growth Chart --     Most recent vital signs: Vitals:   12/24/22 1142 12/24/22 1204  BP:  (!) 155/68  Pulse:    Resp:    Temp: 98.3 F (36.8 C)   SpO2:  95%    Constitutional: Somnolent but arousable to voice. Eyes: Conjunctivae are normal.  Pupils equal, round, and reactive to light bilaterally. Head: Atraumatic. Nose: No congestion/rhinnorhea. Mouth/Throat: Mucous  membranes are moist.  Cardiovascular: Normal rate, regular rhythm. Grossly normal heart sounds.  2+ radial pulses bilaterally. Respiratory: Normal respiratory effort.  No retractions. Lungs CTAB. Gastrointestinal: Soft and nontender. No distention. Musculoskeletal: No lower extremity tenderness nor edema.  Neurologic:  Normal speech and language.  Global weakness with no gross focal neurologic deficits appreciated.    ED Results / Procedures / Treatments   Labs (all labs ordered are listed, but only abnormal results are displayed) Labs Reviewed  BASIC METABOLIC PANEL - Abnormal; Notable for the following components:      Result Value   Sodium 132 (*)    CO2 20 (*)    Glucose, Bld 133 (*)    BUN 27 (*)    Creatinine, Ser 1.32 (*)    Calcium 8.6 (*)    GFR, Estimated 51 (*)    All other components within normal limits  CBC - Abnormal; Notable for the following components:   WBC 11.2 (*)    RBC 4.15 (*)    All other components within normal limits  URINALYSIS, ROUTINE W  REFLEX MICROSCOPIC - Abnormal; Notable for the following components:   Color, Urine YELLOW (*)    APPearance HAZY (*)    Hgb urine dipstick LARGE (*)    Protein, ur 30 (*)    All other components within normal limits  TROPONIN I (HIGH SENSITIVITY) - Abnormal; Notable for the following components:   Troponin I (High Sensitivity) 22 (*)    All other components within normal limits  HEPATIC FUNCTION PANEL  CBG MONITORING, ED  SAMPLE TO BLOOD BANK  TROPONIN I (HIGH SENSITIVITY)     EKG  ED ECG REPORT I, Chesley Noon, the attending physician, personally viewed and interpreted this ECG.   Date: 12/24/2022  EKG Time: 9:45  Rate: 75  Rhythm: normal sinus rhythm  Axis: LAD  Intervals:none  ST&T Change: None, interpretation severely limited due to tremor  ED ECG REPORT I, Chesley Noon, the attending physician, personally viewed and interpreted this ECG.   Date: 12/24/2022  EKG Time: 11:02  Rate:  68  Rhythm: normal sinus rhythm  Axis: LAD  Intervals:none  ST&T Change: None   RADIOLOGY CT head reviewed and interpreted by me with no hemorrhage or midline shift.  PROCEDURES:  Critical Care performed: No  Procedures   MEDICATIONS ORDERED IN ED: Medications  lactated ringers bolus 1,000 mL (0 mLs Intravenous Stopped 12/24/22 1142)  iohexol (OMNIPAQUE) 350 MG/ML injection 100 mL (100 mLs Intravenous Contrast Given 12/24/22 1012)     IMPRESSION / MDM / ASSESSMENT AND PLAN / ED COURSE  I reviewed the triage vital signs and the nursing notes.                              89 y.o. male with past medical history of hypertension, CAD, CKD, hypothyroidism, and PVD who presents to the ED following syncopal episode at home as well as another episode of unresponsiveness here in triage.  Patient's presentation is most consistent with acute presentation with potential threat to life or bodily function.  Differential diagnosis includes, but is not limited to, arrhythmia, ACS, PE, dissection, orthostatic hypotension, sepsis, UTI, pneumonia, stroke, intracranial hemorrhage.  Patient ill-appearing at the time of my evaluation, had another unresponsive episode in triage and was immediately brought back to a room, but now seems to be coming around and beginning to answer questions.  He appears globally weak but no focal neurologic deficits noted on exam.  EKG interpretation is limited due to significant tremor, however patient appears to be in normal sinus rhythm on EKG as well as on pads.  Initial BPs were different in each arm, however he appears to have equal pulses throughout and manual BPs are similar in each arm.  We will check CT head also check CTA of his chest.  Labs without significant anemia or leukocytosis, he does have a mild AKI without acute electrolyte abnormality, will hydrate with IV fluids.  Troponin mildly elevated and we will trend, son does state that patient was started on  Flomax 6 days ago, which could contribute to low blood pressure and syncope.  Son does state that patient has had gradual neurologic decline over the past couple of weeks, has living will where he stated he would not want CPR.  Chart updated to reflect DNR status.  CT head is negative for acute process, CTA of chest/abdomen/pelvis also negative for aortic emergency.  LFTs are unremarkable, patient increasingly awake and alert but would benefit from admission for syncope workup.  Case discussed with hospitalist for admission.      FINAL CLINICAL IMPRESSION(S) / ED DIAGNOSES   Final diagnoses:  Syncope, unspecified syncope type  AKI (acute kidney injury) (HCC)  Tremor     Rx / DC Orders   ED Discharge Orders     None        Note:  This document was prepared using Dragon voice recognition software and may include unintentional dictation errors.   Chesley Noon, MD 12/24/22 [phone]
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
## 2022-12-24 NOTE — ED Triage Notes (Signed)
Pt BP in right arm reading 200 ssytolic - BP in left arm reading 60s systolic. This RN attempting to recheck BP when pt dropped his head and started excessively drooling. Pulse palpable. Pt not responsive to sternal rub. Charge RN called and pt take to room 8 immediately. Secretary paged MD and RT.

## 2022-12-24 NOTE — ED Triage Notes (Addendum)
Pt to ED via [facility] from home. EMS reports son was moving the pt to the bathroom and when he came back pt was unresponsive and "making a noise" EMS reports pt does have constipation issues and may have vagaled. EMS states pt is now back to baseline. When RN asking pt questions pt mumbles and RN unable to understand pt.   EMS VS:  121/58 66 HR  94% RA CBG 201

## 2022-12-25 ENCOUNTER — Observation Stay (HOSPITAL_COMMUNITY)
Admit: 2022-12-25 | Discharge: 2022-12-25 | Disposition: A | Discharge status: Home / Self Care | Payer: Medicare Other | Attending: Internal Medicine | Admitting: Internal Medicine

## 2022-12-25 ENCOUNTER — Observation Stay: Payer: Medicare Other

## 2022-12-25 DIAGNOSIS — N401 Enlarged prostate with lower urinary tract symptoms: Secondary | ICD-10-CM | POA: Diagnosis not present

## 2022-12-25 DIAGNOSIS — W19XXXA Unspecified fall, initial encounter: Secondary | ICD-10-CM

## 2022-12-25 DIAGNOSIS — R55 Syncope and collapse: Secondary | ICD-10-CM

## 2022-12-25 DIAGNOSIS — Y92009 Unspecified place in unspecified non-institutional (private) residence as the place of occurrence of the external cause: Secondary | ICD-10-CM

## 2022-12-25 DIAGNOSIS — Z515 Encounter for palliative care: Secondary | ICD-10-CM

## 2022-12-25 DIAGNOSIS — E039 Hypothyroidism, unspecified: Secondary | ICD-10-CM

## 2022-12-25 DIAGNOSIS — E440 Moderate protein-calorie malnutrition: Secondary | ICD-10-CM

## 2022-12-25 DIAGNOSIS — R251 Tremor, unspecified: Secondary | ICD-10-CM

## 2022-12-25 DIAGNOSIS — N1831 Chronic kidney disease, stage 3a: Secondary | ICD-10-CM | POA: Diagnosis not present

## 2022-12-25 DIAGNOSIS — R350 Frequency of micturition: Secondary | ICD-10-CM

## 2022-12-25 LAB — CBC
HCT: 34.4 % — ABNORMAL LOW (ref 39.0–52.0)
Hemoglobin: 11.4 g/dL — ABNORMAL LOW (ref 13.0–17.0)
MCH: 32 pg (ref 26.0–34.0)
MCHC: 33.1 g/dL (ref 30.0–36.0)
MCV: 96.6 fL (ref 80.0–100.0)
Platelets: 342 10*3/uL (ref 150–400)
RBC: 3.56 MIL/uL — ABNORMAL LOW (ref 4.22–5.81)
RDW: 12.8 % (ref 11.5–15.5)
WBC: 6.7 10*3/uL (ref 4.0–10.5)
nRBC: 0 % (ref 0.0–0.2)

## 2022-12-25 LAB — BASIC METABOLIC PANEL
Anion gap: 11 (ref 5–15)
BUN: 24 mg/dL — ABNORMAL HIGH (ref 8–23)
CO2: 24 mmol/L (ref 22–32)
Calcium: 8.7 mg/dL — ABNORMAL LOW (ref 8.9–10.3)
Chloride: 102 mmol/L (ref 98–111)
Creatinine, Ser: 1.05 mg/dL (ref 0.61–1.24)
GFR, Estimated: >60 mL/min (ref >60)
Glucose, Bld: 133 mg/dL — ABNORMAL HIGH (ref 70–99)
Potassium: 4 mmol/L (ref 3.5–5.1)
Sodium: 137 mmol/L (ref 135–145)

## 2022-12-25 LAB — ECHOCARDIOGRAM COMPLETE
AR max vel: 1.7 cm2
AV Area VTI: 1.87 cm2
AV Area mean vel: 1.69 cm2
AV Mean grad: 4 mmHg
AV Peak grad: 6.8 mmHg
Ao pk vel: 1.3 m/s
Area-P 1/2: 3.27 cm2
MV VTI: 1.51 cm2
S' Lateral: 3.5 cm
Weight: 2380.97 oz

## 2022-12-25 LAB — CORTISOL-AM, BLOOD: Cortisol - AM: 12.4 ug/dL (ref 6.7–22.6)

## 2022-12-25 MED ORDER — ENSURE ENLIVE PO LIQD
237.0000 mL | Freq: Two times a day (BID) | ORAL | Status: DC
  Administered 2022-12-27 – 2022-12-28 (×3): 237 mL via ORAL

## 2022-12-25 MED ORDER — LEVOTHYROXINE SODIUM 100 MCG/5ML IV SOLN
150.0000 ug | Freq: Every day | INTRAVENOUS | Status: AC
  Administered 2022-12-25 – 2022-12-26 (×2): 150 ug via INTRAVENOUS
  Filled 2022-12-25 (×2): qty 10

## 2022-12-25 MED ORDER — LEVOTHYROXINE SODIUM 100 MCG PO TABS: 200.0000 ug | ORAL_TABLET | Freq: Every day | ORAL | Status: DC

## 2022-12-25 MED ORDER — LACTATED RINGERS IV SOLN: INTRAVENOUS | Status: AC

## 2022-12-25 MED ORDER — ADULT MULTIVITAMIN W/MINERALS CH
1.0000 | ORAL_TABLET | Freq: Every day | ORAL | Status: DC
  Administered 2022-12-25 – 2022-12-28 (×4): 1 via ORAL
  Filled 2022-12-25 (×4): qty 1

## 2022-12-25 MED ORDER — TAMSULOSIN HCL 0.4 MG PO CAPS
0.4000 mg | ORAL_CAPSULE | Freq: Every day | ORAL | Status: DC
  Administered 2022-12-25 – 2022-12-27 (×3): 0.4 mg via ORAL
  Filled 2022-12-25 (×3): qty 1

## 2022-12-25 MED ORDER — LEVOTHYROXINE SODIUM 50 MCG PO TABS
175.0000 ug | ORAL_TABLET | Freq: Every day | ORAL | Status: DC
  Administered 2022-12-27 – 2022-12-28 (×2): 175 ug via ORAL
  Filled 2022-12-25 (×2): qty 1

## 2022-12-25 MED ORDER — LEVOTHYROXINE SODIUM 25 MCG PO TABS
25.0000 ug | ORAL_TABLET | Freq: Once | ORAL | Status: AC
  Administered 2022-12-25: 25 ug via ORAL
  Filled 2022-12-25: qty 1

## 2022-12-25 NOTE — Evaluation (Signed)
Occupational Therapy Evaluation Patient Details Name: Richard Castaneda MRN: [id number] DOB: [birth year] Today's Date: 12/25/2022   History of Present Illness Pt is a 89y/o male presenting due to 2 syncopal episodes, one while on the toilet at home and another in ED.  PMH includes history of falls, hypertension, mild dementia, CAD, CKD, peripheral vascular disease, and hypothyroidism. Per son, pt has a broken R wrist due to a fall from 29mo ago and is now able to weightbear through hand.   Clinical Impression   Richard Castaneda was seen for OT evaluation this date. Prior to hospital admission, pt was recently d/c home from rehab requiring SUPERVISION for mobility using RW. Pt lives alone, family providing 24/7 care as needed. Pt currently requires MIN A bed mobility, MAX A don B socks seated EOB. MIN A + RW for ADL t/f - assist stand from bed height and for RW mgmt. MIN A don/doff gown in sitting. Second stand from chair requiring MIN A x2, increased difficulty scooting forward in chair. Pt would benefit from skilled OT to address noted impairments and functional limitations (see below for any additional details). Upon hospital discharge, recommend OT follow up.    If plan is discharge home, recommend the following: A lot of help with walking and/or transfers;A lot of help with bathing/dressing/bathroom;Help with stairs or ramp for entrance    Functional Status Assessment  Patient has had a recent decline in their functional status and demonstrates the ability to make significant improvements in function in a reasonable and predictable amount of time.  Equipment Recommendations  Other (comment) (defer)    Recommendations for Other Services       Precautions / Restrictions Precautions Precautions: Fall Restrictions Weight Bearing Restrictions: No      Mobility Bed Mobility Overal bed mobility: Needs Assistance Bed Mobility: Supine to Sit     Supine to sit: Min assist           Transfers Overall transfer level: Needs assistance Equipment used: Rolling walker (2 wheels) Transfers: Sit to/from Stand Sit to Stand: Min assist           General transfer comment: cues to sequence sitting      Balance Overall balance assessment: Needs assistance Sitting-balance support: Bilateral upper extremity supported, Feet supported Sitting balance-Leahy Scale: Fair     Standing balance support: Bilateral upper extremity supported, Reliant on assistive device for balance, During functional activity Standing balance-Leahy Scale: Fair                             ADL either performed or assessed with clinical judgement   ADL Overall ADL's : Needs assistance/impaired                                       General ADL Comments: MAX A don B socks seated EOB. MIN A + RW simulated toilet t/f. MIN A don/doff gown in sitting      Pertinent Vitals/Pain Pain Assessment Pain Assessment: No/denies pain     Extremity/Trunk Assessment Upper Extremity Assessment Upper Extremity Assessment: Generalized weakness   Lower Extremity Assessment Lower Extremity Assessment: Generalized weakness       Communication Communication Communication: No apparent difficulties Cueing Techniques: Verbal cues;Tactile cues   Cognition Arousal: Alert Behavior During Therapy: WFL for tasks assessed/performed Overall Cognitive Status: Impaired/Different from baseline Area of Impairment: Orientation  Orientation Level: Disoriented to, Situation, Time                              Home Living Family/patient expects to be discharged to:: Private residence Living Arrangements: Alone Available Help at Discharge: Family;Available 24 hours/day Type of Home: House Home Access: Stairs to enter [employer] of Steps: 1 Entrance Stairs-Rails: None Home Layout: One level     Bathroom Shower/Tub: Company secretary: Standard Bathroom Accessibility: Yes   Home Equipment: Scientist, research (medical) (4 wheels);Rolling Walker (2 wheels)          Prior Functioning/Environment Prior Level of Function : Independent/Modified Independent;History of Falls (last six months)             Mobility Comments: ~2-3 weeks ago utilized RW and supervision for ambulation/stairs. Per son, pt has difficulty sequencing stair negotiation and needing WC more leading up to hospital admission. ADLs Comments: Family assist with ADLs        OT Problem List: Decreased strength;Decreased range of motion;Decreased activity tolerance;Impaired balance (sitting and/or standing)      OT Treatment/Interventions: Self-care/ADL training;Therapeutic exercise;Energy conservation;DME and/or AE instruction;Therapeutic activities;Balance training    OT Goals(Current goals can be found in the care plan section) Acute Rehab OT Goals Patient Stated Goal: to go home OT Goal Formulation: With patient/family Time For Goal Achievement: 01/08/23 Potential to Achieve Goals: Good ADL Goals Pt Will Perform Grooming: with modified independence;standing Pt Will Perform Lower Body Dressing: sit to/from stand;with set-up;with supervision Pt Will Transfer to Toilet: with modified independence;regular height toilet;ambulating  OT Frequency: Min 1X/week    Co-evaluation PT/OT/SLP Co-Evaluation/Treatment: Yes Reason for Co-Treatment: Necessary to address cognition/behavior during functional activity;For patient/therapist safety PT goals addressed during session: Mobility/safety with mobility OT goals addressed during session: ADL's and self-care      AM-PAC OT "6 Clicks" Daily Activity     Outcome Measure Help from another person eating meals?: None Help from another person taking care of personal grooming?: A Little Help from another person toileting, which includes using toliet, bedpan, or urinal?: A Lot Help from  another person bathing (including washing, rinsing, drying)?: A Lot Help from another person to put on and taking off regular upper body clothing?: A Little Help from another person to put on and taking off regular lower body clothing?: A Lot 6 Click Score: 16   End of Session Equipment Utilized During Treatment: Rolling walker (2 wheels)  Activity Tolerance: Patient tolerated treatment well Patient left: in chair;with call bell/phone within reach;with chair alarm set;with family/visitor present  OT Visit Diagnosis: Other abnormalities of gait and mobility (R26.89);Muscle weakness (generalized) (M62.81)                Time: 1610-9604 OT Time Calculation (min): 27 min Charges:  OT General Charges $OT Visit: 1 Visit OT Evaluation $OT Eval Moderate Complexity: 1 Mod OT Treatments $Self Care/Home Management : 8-22 mins  Kathie Dike, M.S. OTR/L  12/25/22, 1:15 PM  ascom [phone]

## 2022-12-25 NOTE — Progress Notes (Signed)
Progress Note   Patient: Richard Castaneda VHQ:[id number] DOB: [birth year] DOA: 12/24/2022     0 DOS: the patient was seen and examined on 12/25/2022   Brief hospital course: Taken from H&P.  Richard Castaneda is a 89 y.o. male with medical history significant of HTN, hypothyroidism, BPH, presented with syncope.   Per son patient had a fall 52-month ago, broke his wrist and discharged from nursing home later on.  Since then continued to deteriorate, frequent episodes of confusion with gait and ambulation issues.  Recently home PT team recommended patient get evaluated for Parkinson's due to small shuffling gait and significant worsening of tremor and rigidity in his arms.  Son found patient on toilet, slumped over to the side but still sitting, unconscious, remained unconscious for about 5 to 7 minutes, he was cool to touch and appeared pale and breathing heavily during that time.  EMS was called.  While in the ED patient had another episode of syncope, lasted for 5 minutes, during that time patient was found drooling and there was significant disparity of blood pressure on right and left arm.  Due to concern of aortic aneurysm or dissection, CTA dissection was ordered and came back negative.  CT head was negative for any acute abnormality.  Chest x-ray negative for any acute infiltrate.   8/23: Hemodynamically stable, positive orthostatic vitals with 30 degrees drop from sitting to standing.  Some improvement in tremor with the start of Sinemet but it can worsen postural hypotension.  Based on his history and progressive decline over the past few months, concern of typical versus atypical Parkinson's.  Discussed with neurology and they were recommending outpatient neurology evaluation.  Also found to have significantly elevated TSH above 10, not sure whether he was taking his home Synthroid or not but dose was increased and need to have a close follow-up with PCP and repeat TSH in 3 to 4  weeks. Echocardiogram was normal with normal EF, grade 1 diastolic dysfunction and no other significant abnormality.  Giving some IV fluid. PT is recommending SNF, TOC to look for his benefit as he recently discharged from rehab.     Assessment and Plan: * Syncope Patient with continuous decline over the past few month, recently released from rehab. Outpatient PT and OT are concerning for Parkinson's due to worsening rigidity, shuffling gait and very slow response.  No diagnosis yet. Positive orthostatic vitals especially with standing.  CT head without any significant abnormality.  Echocardiogram with normal EF and only grade 1 diastolic dysfunction. TSH significantly elevated-not sure whether he was taking his medications regularly or not.  Concern of vasovagal versus autonomic dysfunction with Parkinson's -Patient was started on Sinemet trial by admitting provider-some improvement in tremor but Sinemet itself can cause orthostatic.  If it is atypical Parkinson's then he might not get benefit from Sinemet. -Need outpatient neurology evaluation -MRI brain was ordered as advised by neurology -Palliative care consult -Continue with Sinemet for now  Fall at home, initial encounter Patient with worsening ambulatory dysfunction, becoming very slow with shuffling gait and tremors.  Intermittent confusion and very slow response. -PT and OT are recommending SNF -Son is okay if insurance will pay-TOC to check his benefit as he recently came home from rehab  PVD (peripheral vascular disease) (HCC) -Continue aspirin and statin  CKD (chronic kidney disease) stage 3, GFR 30-59 ml/min (HCC) Seems stable rather some improvement in creatinine from baseline.  Appears to have CKD stage IIIa. -Monitor renal function -Avoid nephrotoxins  Benign prostatic hyperplasia with urinary frequency -Continue home finasteride and Myrbetriq  Malnutrition of moderate degree -Consult to dietitian  Essential  hypertension Blood pressure mildly elevated this morning.  Per son he was recently started back on amlodipine which was held for many months due to concern of hypotension. -Continue to monitor with as needed for now  Hypothyroidism TSH significantly elevated above 10, not sure whether he was taking his home Synthroid regularly or not.  It can also contribute with slow response and intermittent confusion. -Increasing the dose of home Synthroid. -Patient will need a repeat testing in 2 to 3 weeks   Subjective: Patient was sitting in a recliner comfortably when seen today.  Mild tremors noted.  Per son tremor seems little better after getting Sinemet.  Slow decline over the past few month, recently discharged from rehab few weeks ago.  Physical Exam: Vitals:   12/24/22 2033 12/25/22 0124 12/25/22 0618 12/25/22 0751  BP: 135/61  134/62 (!) 148/66  Pulse: 84  70 60  Resp: 16   17  Temp: 98.8 F (37.1 C)  97.9 F (36.6 C) 98 F (36.7 C)  TempSrc: Oral  Oral   SpO2: 94%  96% 94%  Weight:  67.5 kg     General.  Ill-appearing, frail elderly man, in no acute distress. Pulmonary.  Lungs clear bilaterally, normal respiratory effort. CV.  Regular rate and rhythm, no JVD, rub or murmur. Abdomen.  Soft, nontender, nondistended, BS positive. CNS.  Alert and oriented .  No focal neurologic deficit. Extremities.  No edema, no cyanosis, pulses intact and symmetrical. Psychiatry.  Appears slow but able to answer questions appropriately.  Data Reviewed: Prior data reviewed  Family Communication: Discussed with son at bedside  Disposition: Status is: Observation The patient will require care spanning > 2 midnights and should be moved to inpatient because: Severity of illness  Planned Discharge Destination: Skilled nursing facility  DVT prophylaxis.  Lovenox Time spent: 50 minutes  This record has been created using Conservation officer, historic buildings. Errors have been sought and corrected,but  may not always be located. Such creation errors do not reflect on the standard of care.   Author: Arnetha Courser, MD 12/25/2022 2:32 PM  For on call review [email or web address].

## 2022-12-25 NOTE — Evaluation (Addendum)
Physical Therapy Evaluation Patient Details Name: Richard Castaneda MRN: [id number] DOB: Jul 30, 1929 Today's Date: 12/25/2022  History of Present Illness  Pt is a 89y/o male presenting due to 2 syncopal episodes, one while on the toilet at home and another in ED.  PMH includes history of falls, hypertension, mild dementia, CAD, CKD, peripheral vascular disease, and hypothyroidism. Per son, pt has a broken R wrist due to a fall from 28mo ago and is now able to weightbear through hand.  Clinical Impression   Pt presents laying in bed with son in room, A&Ox2 disoriented to situation and time. He currently lives home alone (family has been staying 24/7) with 1 stair to enter and no rails. Per son, ~2-3 weeks ago he utilized RW and needed supervision for ambulation/stairs (has difficulty sequencing stair negotiation despite HHPT intervention). More recently, pt has been having increasing difficulty with performing sit<>stands and needing WC for mobility.    PT/OT cotreat performed for patient/therapist safety. Orthostatics performed during session and overall unremarkable. Pt able to perform supine>sit with minA for completion of movement. He also required minA for initiating sit>stand but able to complete stand>sit with CGA. Pt tolerated ambulating ~15ft with RW, intermittently minA to assist with RW guidance. PT noting shuffling gait, forward head/rounded shoulders posture, and decreased awareness of surroundings (pt hit door/wall, unable to correct and required therapist facilitation of RW). Pt also unable to correct posture during mobility, minimizing ability to visually scan surroundings. Pt son mentioned this is the best he's seen the pt move within the last few weeks. Pt would benefit from continued skilled therapy to maximize functional abilities.       If plan is discharge home, recommend the following: A little help with walking and/or transfers;A little help with bathing/dressing/bathroom;Assistance  with cooking/housework;Direct supervision/assist for medications management;Help with stairs or ramp for entrance;Assist for transportation;Direct supervision/assist for financial management   Can travel by private vehicle   Yes    Equipment Recommendations None recommended by PT  Recommendations for Other Services       Functional Status Assessment Patient has had a recent decline in their functional status and demonstrates the ability to make significant improvements in function in a reasonable and predictable amount of time.     Precautions / Restrictions Precautions Precautions: Fall Restrictions Weight Bearing Restrictions: No      Mobility  Bed Mobility Overal bed mobility: Needs Assistance Bed Mobility: Supine to Sit     Supine to sit: Min assist     General bed mobility comments: minA to complete task    Transfers Overall transfer level: Needs assistance Equipment used: Rolling walker (2 wheels) Transfers: Sit to/from Stand Sit to Stand: Min assist, Contact guard assist           General transfer comment: Sit>stand minA to intiate movement, stand>sit CGA    Ambulation/Gait Ambulation/Gait assistance: Contact guard assist Gait Distance (Feet): 70 Feet Assistive device: Rolling walker (2 wheels) Gait Pattern/deviations: Shuffle, Trunk flexed Gait velocity: decreased     General Gait Details: Decreased awareness of surroundings, unable to correct after hitting door/wall (required therapist facilitation of RW). Unable to correct forward head/rounded shoulders with cueing, minimizing ability to visually scan.  Stairs            Wheelchair Mobility     Tilt Bed    Modified Rankin (Stroke Patients Only)       Balance Overall balance assessment: Needs assistance Sitting-balance support: Feet supported, Feet unsupported, No upper extremity supported, Bilateral  upper extremity supported Sitting balance-Leahy Scale: Fair     Standing balance  support: Bilateral upper extremity supported, Reliant on assistive device for balance, During functional activity Standing balance-Leahy Scale: Poor                               Pertinent Vitals/Pain Pain Assessment Pain Assessment: No/denies pain    Home Living Family/patient expects to be discharged to:: Private residence Living Arrangements: Alone Available Help at Discharge: Family;Available 24 hours/day Type of Home: House Home Access: Stairs to enter Entrance Stairs-Rails: None Entrance Stairs-Number of Steps: 1   Home Layout: One level Home Equipment: Scientist, research (medical) (4 wheels);Rolling Walker (2 wheels)      Prior Function Prior Level of Function : Independent/Modified Independent;History of Falls (last six months)             Mobility Comments: ~2-3 weeks ago utilized RW and supervision for ambulation/stairs. Per son, pt has difficulty sequencing stair negotiation and needing WC more leading up to hospital admission. ADLs Comments: Family assist with ADLs     Extremity/Trunk Assessment   Upper Extremity Assessment Upper Extremity Assessment: Overall WFL for tasks assessed    Lower Extremity Assessment Lower Extremity Assessment: Generalized weakness       Communication   Communication Communication: No apparent difficulties Cueing Techniques: Verbal cues;Tactile cues  Cognition Arousal: Alert Behavior During Therapy: WFL for tasks assessed/performed Overall Cognitive Status: Impaired/Different from baseline Area of Impairment: Orientation                 Orientation Level: Disoriented to, Situation, Time                      General Comments      Exercises     Assessment/Plan    PT Assessment Patient needs continued PT services  PT Problem List Decreased strength;Decreased range of motion;Decreased activity tolerance;Decreased balance;Decreased mobility;Decreased coordination;Decreased knowledge of use  of DME;Decreased safety awareness       PT Treatment Interventions DME instruction;Gait training;Stair training;Functional mobility training;Therapeutic activities;Therapeutic exercise;Balance training;Neuromuscular re-education;Patient/family education    PT Goals (Current goals can be found in the Care Plan section)  Acute Rehab PT Goals Patient Stated Goal: return home PT Goal Formulation: With patient Time For Goal Achievement: 01/08/23 Potential to Achieve Goals: Fair    Frequency Min 1X/week     Co-evaluation PT/OT/SLP Co-Evaluation/Treatment: Yes Reason for Co-Treatment: Necessary to address cognition/behavior during functional activity;For patient/therapist safety PT goals addressed during session: Mobility/safety with mobility OT goals addressed during session: ADL's and self-care       AM-PAC PT "6 Clicks" Mobility  Outcome Measure Help needed turning from your back to your side while in a flat bed without using bedrails?: A Little Help needed moving from lying on your back to sitting on the side of a flat bed without using bedrails?: A Little Help needed moving to and from a bed to a chair (including a wheelchair)?: A Little Help needed standing up from a chair using your arms (e.g., wheelchair or bedside chair)?: A Little Help needed to walk in hospital room?: A Little Help needed climbing 3-5 steps with a railing? : A Lot 6 Click Score: 17    End of Session   Activity Tolerance: Patient tolerated treatment well Patient left: in chair;with call bell/phone within reach;with chair alarm set;with family/visitor present   PT Visit Diagnosis: Unsteadiness on feet (R26.81);Other abnormalities  of gait and mobility (R26.89);History of falling (Z91.81);Muscle weakness (generalized) (M62.81);Difficulty in walking, not elsewhere classified (R26.2);Dizziness and giddiness (R42)    Time: 1610-9604 PT Time Calculation (min) (ACUTE ONLY): 27 min   Charges:   PT  Evaluation $PT Eval Low Complexity: 1 Low PT Treatments $Therapeutic Activity: 8-22 mins PT General Charges $$ ACUTE PT VISIT: 1 Visit        Akeila Lana, PT, SPT 12:28 PM,12/25/22

## 2022-12-25 NOTE — Assessment & Plan Note (Signed)
Continue aspirin and statin. 

## 2022-12-25 NOTE — Assessment & Plan Note (Signed)
Seems stable rather some improvement in creatinine from baseline.  Appears to have CKD stage IIIa. -Monitor renal function -Avoid nephrotoxins

## 2022-12-25 NOTE — Progress Notes (Signed)
Initial Nutrition Assessment  DOCUMENTATION CODES:   Not applicable  INTERVENTION:   -Feeding assistance with meals -MVI with minerals daily -Ensure Enlive po BID, each supplement provides 350 kcal and 20 grams of protein.  -Liberalize diet to regular for widest variety of meal selections  NUTRITION DIAGNOSIS:   Increased nutrient needs related to acute illness as evidenced by estimated needs.  GOAL:   Patient will meet greater than or equal to 90% of their needs  MONITOR:   PO intake, Supplement acceptance  REASON FOR ASSESSMENT:   Consult Assessment of nutrition requirement/status  ASSESSMENT:   Pt with medical history significant of HTN, hypothyroidism, BPH, presented with syncope.  Pt admitted with syncope.   Reviewed I/O's: +120 ml x 24 hours   Pt unavailable at time of visit. Palliative care team meeting with pt at time of visit. RD unable to obtain further nutrition-related history or complete nutrition-focused physical exam at this time.    Pt currently on a heart healthy diet. Noted meal completions 90%.   Pt with tremors; may benefit from feeding assistance.   Per H&P, pt has experienced decline over the past 3 months since discharge from short term SNF secondary to broken wrist and fall.   Wt has been stable over the past year.   Pt with history of moderate malnutrition; suspect this is ongoing, however, unable to identify at this time. Pt would benefit from addition of oral nutrition supplements.   Medications reviewed and include sinemet and lactated ringers infusion @ 100 ml/hr.   No results found for: "HGBA1C" PTA DM medications are none.   Labs reviewed: CBGS: 102 (inpatient orders for glycemic control are none).    Diet Order:   Diet Order             Diet Heart Room service appropriate? Yes; Fluid consistency: Thin  Diet effective now                   EDUCATION NEEDS:   No education needs have been identified at this  time  Skin:  Skin Assessment: Reviewed RN Assessment  Last BM:  Unknown  Height:   Ht Readings from Last 1 Encounters:  12/15/22 5\' 6"  (1.676 m)    Weight:   Wt Readings from Last 1 Encounters:  12/25/22 67.5 kg    Ideal Body Weight:  59.1 kg  BMI:  Body mass index is 24.02 kg/m.  Estimated Nutritional Needs:   Kcal:  1650-1850  Protein:  85-100 grams  Fluid:  > 1.6 L    Levada Schilling, RD, LDN, CDCES Registered Dietitian II Certified Diabetes Care and Education Specialist Please refer to [facility] for RD and/or RD on-call/weekend/after hours pager

## 2022-12-25 NOTE — Assessment & Plan Note (Signed)
-  Continue home finasteride and Myrbetriq

## 2022-12-25 NOTE — Plan of Care (Signed)

## 2022-12-25 NOTE — Hospital Course (Addendum)
Taken from H&P.  Richard Castaneda is a 89 y.o. male with medical history significant of HTN, hypothyroidism, BPH, presented with syncope.   Per son patient had a fall 61-month ago, broke his wrist and discharged from nursing home later on.  Since then continued to deteriorate, frequent episodes of confusion with gait and ambulation issues.  Recently home PT team recommended patient get evaluated for Parkinson's due to small shuffling gait and significant worsening of tremor and rigidity in his arms.  Son found patient on toilet, slumped over to the side but still sitting, unconscious, remained unconscious for about 5 to 7 minutes, he was cool to touch and appeared pale and breathing heavily during that time.  EMS was called.  While in the ED patient had another episode of syncope, lasted for 5 minutes, during that time patient was found drooling and there was significant disparity of blood pressure on right and left arm.  Due to concern of aortic aneurysm or dissection, CTA dissection was ordered and came back negative.  CT head was negative for any acute abnormality.  Chest x-ray negative for any acute infiltrate.   8/23: Hemodynamically stable, positive orthostatic vitals with 30 degrees drop from sitting to standing.  Some improvement in tremor with the start of Sinemet but it can worsen postural hypotension.  Based on his history and progressive decline over the past few months, concern of typical versus atypical Parkinson's.  Discussed with neurology and they were recommending outpatient neurology evaluation.  Also found to have significantly elevated TSH above 10, not sure whether he was taking his home Synthroid or not but dose was increased and need to have a close follow-up with PCP and repeat TSH in 3 to 4 weeks. Echocardiogram was normal with normal EF, grade 1 diastolic dysfunction and no other significant abnormality.  Giving some IV fluid. PT is recommending SNF, TOC to look for his benefit as  he recently discharged from rehab.  8/24: Patient more somnolent and lethargic this morning, able to eat couple of bites with the help of son, just moving his extremities on deep sternal rub during morning rounds.  Getting ABG to rule out hypercarbia.  Palliative care is on board  8/25: Alert and talking, although slow.  Significant improvement in tremors and upper extremity rigidity.  Concern of aspiration with thin liquids, swallow team is recommending dysphagia 1 diet with nectar thick liquids.  Family is leaning toward taking him back home with hospice help, they would like to see neurologist and continue Sinemet as it seems helping.  8/26:  Cardiology evaluated him and make some changes to the dose of Sinemet.  Some coughing and upper respiratory congestion, mild leukocytosis today, concern of aspiration, chest x-ray with small left pleural effusion and no other significant infiltrate noted.  He was started on 5-day course of Augmentin which she will continue on discharge to complete the course.  Sinemet dose was adjusted as recommended by neurology, side effects were explained to family and if continued to tolerate in next 2 to 3 weeks they can increase the dose to 1 tablet 3 times a day.  Patient will get benefit from outpatient neurology follow-up.  Family decided to go home with hospice help.  They will follow-up with providers according to hospice protocol and understand these restrictions very well.  Patient should be on dysphagia 1 diet with nectar thick liquids due to concern of aspiration.  Patient will continue on current medications and need to have a close follow-up  either with hospice or primary care provider.

## 2022-12-25 NOTE — Consult Note (Signed)
Consultation Note Date: 12/25/2022   Patient Name: Richard Castaneda  DOB: 12/23/1929  MRN: [id number]  Age / Sex: 89 y.o., male  PCP: Marisue Ivan, MD Referring Physician: Arnetha Courser, MD  Reason for Consultation: Establishing goals of care   HPI/Brief Hospital Course: 89 y.o. male  with past medical history of HTN, hypothyroidism, CAD, CKD and PVD admitted from home on 12/24/2022 with syncope episode.  Son reports assisting [name] to restroom, on his return to assist Mr. Dress he found him unresponsive, attempted to awaked Mr. Ray for several minutes but then notices changes in respirations which prompted him to call EMS  On arrival to ED, nurse staff witnessed recurrent syncope event, spontaneously returned to baseline  CT head (-) CTA (-) for dissection  Palliative medicine was consulted for assisting with goals of care conversations.  Subjective:  Extensive chart review has been completed prior to meeting patient including labs, vital signs, imaging, progress notes, orders, and available advanced directive documents from current and previous encounters.  Visited with Richard Castaneda at his bedside. He is awake, alert, disoriented with mumbled speech at times, unable to engage in GOC discussions. Son-Tony at bedside during time of visit.  Introduced myself as a Publishing rights manager as a member of the palliative care team. Explained palliative medicine is specialized medical care for people living with serious illness. It focuses on providing relief from the symptoms and stress of a serious illness. The goal is to improve quality of life for both the patient and the family.   [name] shares that [name] is widowed, [name] has a sister-Debra who is on family vacation this week. Prior to admission, Richard Castaneda lived at home but with around the clock caregivers including family, private pay and [employer] caregivers. At baseline, [name] requires  assistance with bathing and other hygiene, able to ambulate short distances with his walker.  Noted recent admit in June after a fall resulting in right wrist fracture-discharged to [facility] at that time. Has been receiving home PT/OT twice per week since returning home.  [name] shares his concern related to the significant decline he has noticed in his father in a short time period-about 2 weeks. Function and cognitive decline. [name] shares, Richard Castaneda is increasingly more confused, struggles with ability to follow commands, worsening tremor, shuffling gait and now with recurrent syncope episodes. [name] shares his understanding of orthostatic hypotension that could have been exacerbated by Flomax that he was recently started on.  [name] also shares concern from home PT/OT regarding Parkinson's disease. He was awaiting an outpatient referral to neurology prior to admission. He is aware Sinemet has been started to help alleviate tremors, he feels some improvement but also aware this can contribute to worsening hypotension.  [name] is hopeful to get answers from MRI from earlier today and is hopeful to get answers from neuro team regarding their perspective on potential diagnoses. He is also hopeful Richard Castaneda can return to SNF to regain strength and mobility.  We discussed continuing with current plan of care versus shifting our focus to comfort care/hospice. We discussed the overall philosophy of hospice. We also discussed having outpatient Palliative care follow Richard Castaneda at discharge, once answers and recommendations provided from neuro team or with an ongoing decline being able to easily transition to hospice care at that time for which [name] agrees.  [name] requests to speak with [facility] team regarding a hospital bed being brought into [name] home.  I discussed importance of continued  conversations with family/support persons and all members of their medical team regarding overall plan of care  and treatment options ensuring decisions are in alignment with patients goals of care.  All questions/concerns addressed.  PMT will continue to follow and support patient as needed.  Objective: Primary Diagnoses: Present on Admission:  Syncope  Essential hypertension  Hypothyroidism  PVD (peripheral vascular disease) (HCC)  Benign prostatic hyperplasia with urinary frequency  CKD (chronic kidney disease) stage 3, GFR 30-59 ml/min (HCC)  Malnutrition of moderate degree   Physical Exam  Vital Signs: BP (!) 148/66   Pulse 60   Temp 98 F (36.7 C)   Resp 17   Wt 67.5 kg   SpO2 94%   BMI 24.02 kg/m  Pain Scale: Faces   Pain Score: 0-No pain  Assessment and Plan  SUMMARY OF RECOMMENDATIONS   DNR TOC aware of OP Palliative Care referral PMT to continue to follow for ongoing needs and support   Thank you for this consult and allowing Palliative Medicine to participate in the care of Richard Castaneda. Palliative medicine will continue to follow and assist as needed.   Time Total: 75 minutes  Time spent includes: Detailed review of medical records (labs, imaging, vital signs), medically appropriate exam (mental status, respiratory, cardiac, skin), discussed with treatment team, counseling and educating patient, family and staff, documenting clinical information, medication management and coordination of care.   Signed by: Leeanne Deed, DNP, AGNP-C Palliative Medicine    Please contact Palliative Medicine Team phone at [phone] for questions and concerns.  For individual provider: See Loretha Stapler

## 2022-12-25 NOTE — Progress Notes (Signed)
SLP Cancellation Note  Patient Details Name: Richard Castaneda MRN: [id number] DOB: 28-May-1929   Cancelled treatment:       Reason Eval/Treat Not Completed: Patient at procedure or test/unavailable  Pt out of room at MRI.   Will re-attempt as able.   Benedicta Sultan 12/25/2022, 12:24 PM

## 2022-12-25 NOTE — Progress Notes (Signed)
SLP Cancellation Note  Patient Details Name: Richard Castaneda MRN: [id number] DOB: Nov 12, 1929   Cancelled treatment:       Reason Eval/Treat Not Completed: SLP screened, no needs identified, will sign off  Pt recently returning to room following MRI (results not in at present). SLP provided swallowing screen with no overt s/s of aspiration observed. Pt's nurse provided that pt took his medicine without incident earlier in the day. She also states that pt's son reports absence of s/s of dysphagia/aspiration when sitting upright.   ST services will sign off, please reconsult if issues arise.   Trevante Tennell B. Dreama Saa, M.S., CCC-SLP, CBIS Speech-Language Pathologist Certified Brain Injury Specialist [facility] [phone] Ascom [phone] Fax [phone]   Clydine Parkison Dreama Saa 12/25/2022, 2:02 PM

## 2022-12-25 NOTE — Assessment & Plan Note (Signed)
Patient with worsening ambulatory dysfunction, becoming very slow with shuffling gait and tremors.  Intermittent confusion and very slow response. -PT and OT are recommending SNF -Son is okay if insurance will pay-TOC to check his benefit as he recently came home from rehab

## 2022-12-25 NOTE — Assessment & Plan Note (Signed)
Blood pressure mildly elevated this morning.  Per son he was recently started back on amlodipine which was held for many months due to concern of hypotension. -Continue to monitor with as needed for now

## 2022-12-25 NOTE — TOC Initial Note (Signed)
Transition of Care [facility]) - Initial/Assessment Note    Patient Details  Name: Richard Castaneda MRN: [id number] Date of Birth: [birth year]  Transition of Care [facility]) CM/SW Contact:    Garret Reddish, RN Phone Number: 12/25/2022, 3:25 PM  Clinical Narrative:   Chart reviewed.  Noted that patient was admitted Syncope.     I have spoken with patient's son  Triton Altice.  He informs me that prior to admission his father lived at  his own home.  He has caregiver support and family that is with him 24 hours a day.  Always [facility] assist with personal care services at home.  Patient was able to get around with a walker at home.  [name] reports that [facility] was coming out to the home for Home Health PT and OT services.  Patient has a walker, rollator, and cane at home.    [name] informs me that Mr. Breining was recently discharged from [facility].  He reports that patient stayed in rehab for about 26 days.  [name] was agreeable SNF recommendation. [name] understands that patient may be in co-pay days and he is agreeable to short term rehab for his father.  He would prefer [facility].  [name] is also agreeable to bed search in the [place] area.  I have left Tiffany, Admission Coordinator with [facility] a message to check on bed days to see if she able to offer patient a bed.    I have completed fl 2 and sent SNF referral to [facility] area facilities.    TOC will continue to follow for discharge planning.           Expected Discharge Plan: Skilled Nursing Facility Barriers to Discharge: No Barriers Identified   Patient Goals and CMS Choice   CMS Medicare.gov Compare Post Acute Care list provided to:: Patient Represenative (must comment) [name] [place]) Choice offered to / list presented to : Adult Children      Expected Discharge Plan and Services In-house Referral: Clinical Social Work   Post Acute Care Choice: Skilled Nursing Facility Living arrangements for the past 2 months:  Single Family Home                     Date DME Agency Contacted:  (Patient was active with [facility] prior to admission)         Date HH Agency Contacted:  (Patient was active with [facility] for PT/OT prior to admission)      Prior Living Arrangements/Services Living arrangements for the past 2 months: Single Family Home Lives with:: Self (Currently has 24 hour care between family and private duty services.)   Do you feel safe going back to the place where you live?: Yes        Care giver support system in place?: Yes (comment) (Patient has a supportive daugher and son) Current home services: DME, Home PT, Home OT (Rollator, walker, cane)    Activities of Daily Living      Permission Sought/Granted                  Emotional Assessment       Orientation: : Oriented to Self, Oriented to Place      Admission diagnosis:  Syncope [R55] Tremor [R25.1] AKI (acute kidney injury) (HCC) [N17.9] Syncope, unspecified syncope type [R55] Patient Active Problem List   Diagnosis Date Noted   Tremor 12/25/2022   Syncope 12/24/2022   Fall at home, initial encounter 10/12/2022   Right  wrist fracture, closed, initial encounter 10/12/2022   History of syncope 10/12/2022   Malnutrition of moderate degree 10/12/2022   Sepsis (HCC) 08/15/2021   CAP (community acquired pneumonia) 08/15/2021   Anemia 08/14/2021   Rectal bleeding 08/14/2021   Syncope, vasovagal 08/14/2021   Acute respiratory failure with hypoxia (HCC) 08/14/2021   Dyspepsia and other specified disorders of function of stomach 07/09/2020   Hemorrhoids 07/09/2020   Inguinal hernia 07/09/2020   Other and unspecified hyperlipidemia 07/09/2020   Lumbar spondylosis 10/03/2018   CKD (chronic kidney disease) stage 3, GFR 30-59 ml/min (HCC) 09/01/2018   Bilateral carotid artery stenosis 05/27/2018   Status post revision of total knee replacement, left 06/22/2017   Status post unicompartmental knee replacement,  left 12/31/2016   Borderline diabetes mellitus 11/16/2016   Hypothyroidism 11/16/2016   Benign prostatic hyperplasia with urinary frequency 10/19/2016   Primary osteoarthritis of left knee 08/26/2016   Vaccine counseling 03/09/2016   Medicare annual wellness visit, subsequent 03/09/2016   Medicare annual wellness visit, initial 03/09/2016   Moderate tricuspid insufficiency 09/12/2014   Moderate mitral insufficiency 09/12/2014   PVD (peripheral vascular disease) (HCC) 12/29/2013   Pure hypercholesterolemia 12/29/2013   Coronary artery disease 12/29/2013   Anterior myocardial infarction (HCC) 12/29/2013   Essential hypertension 12/29/2013   PCP:  Marisue Ivan, MD Pharmacy:   [facility] - [place], [place] - [address] [place] [address] Phone: [phone] Fax: [phone]     Social Determinants of Health (SDOH) Social History: SDOH Screenings   Tobacco Use: Low Risk  (12/24/2022)   SDOH Interventions:     Readmission Risk Interventions    08/15/2021    3:18 PM  Readmission Risk Prevention Plan  Post Dischage Appt Complete  Medication Screening Complete  Transportation Screening Complete

## 2022-12-25 NOTE — Progress Notes (Signed)
*  PRELIMINARY RESULTS* Echocardiogram 2D Echocardiogram has been performed.  [name] 12/25/2022, 12:53 PM

## 2022-12-25 NOTE — NC FL2 (Signed)
[place] MEDICAID FL2 LEVEL OF CARE FORM     IDENTIFICATION  Patient Name: Richard Castaneda Birthdate: Dec 05, 1929 Sex: male Admission Date (Current Location): 12/24/2022  [place] and [place] Number:  Chiropodist and Address:  [facility], [address] [place], [place] [address]      Provider Number: [id number]  Attending Physician Name and Address:  Arnetha Courser, MD  Relative Name and Phone Number:  Lani Beavers [phone]    Current Level of Care: Hospital Recommended Level of Care: Skilled Nursing Facility Prior Approval Number:    Date Approved/Denied:   PASRR Number: [id number]  Discharge Plan: SNF    Current Diagnoses: Patient Active Problem List   Diagnosis Date Noted   Tremor 12/25/2022   Syncope 12/24/2022   Fall at home, initial encounter 10/12/2022   Right wrist fracture, closed, initial encounter 10/12/2022   History of syncope 10/12/2022   Malnutrition of moderate degree 10/12/2022   Sepsis (HCC) 08/15/2021   CAP (community acquired pneumonia) 08/15/2021   Anemia 08/14/2021   Rectal bleeding 08/14/2021   Syncope, vasovagal 08/14/2021   Acute respiratory failure with hypoxia (HCC) 08/14/2021   Dyspepsia and other specified disorders of function of stomach 07/09/2020   Hemorrhoids 07/09/2020   Inguinal hernia 07/09/2020   Other and unspecified hyperlipidemia 07/09/2020   Lumbar spondylosis 10/03/2018   CKD (chronic kidney disease) stage 3, GFR 30-59 ml/min (HCC) 09/01/2018   Bilateral carotid artery stenosis 05/27/2018   Status post revision of total knee replacement, left 06/22/2017   Status post unicompartmental knee replacement, left 12/31/2016   Borderline diabetes mellitus 11/16/2016   Hypothyroidism 11/16/2016   Benign prostatic hyperplasia with urinary frequency 10/19/2016   Primary osteoarthritis of left knee 08/26/2016   Vaccine counseling 03/09/2016   Medicare annual wellness visit, subsequent  03/09/2016   Medicare annual wellness visit, initial 03/09/2016   Moderate tricuspid insufficiency 09/12/2014   Moderate mitral insufficiency 09/12/2014   PVD (peripheral vascular disease) (HCC) 12/29/2013   Pure hypercholesterolemia 12/29/2013   Coronary artery disease 12/29/2013   Anterior myocardial infarction (HCC) 12/29/2013   Essential hypertension 12/29/2013    Orientation RESPIRATION BLADDER Height & Weight     Self, Place  Normal Incontinent Weight: 67.5 kg Height:     BEHAVIORAL SYMPTOMS/MOOD NEUROLOGICAL BOWEL NUTRITION STATUS      Incontinent  (See Discharge Summary)  AMBULATORY STATUS COMMUNICATION OF NEEDS Skin   Extensive Assist Verbally Normal                       Personal Care Assistance Level of Assistance  Bathing, Feeding, Dressing Bathing Assistance: Maximum assistance Feeding assistance: Limited assistance Dressing Assistance: Maximum assistance     Functional Limitations Info  Sight, Hearing, Speech Sight Info: Impaired (Wears glasses) Hearing Info: Adequate Speech Info: Adequate    SPECIAL CARE FACTORS FREQUENCY  PT (By licensed PT), OT (By licensed OT)     PT Frequency: 5x weekly OT Frequency: 5x weekly            Contractures Contractures Info: Not present    Additional Factors Info  Code Status, Allergies Code Status Info: DNR Allergies Info: Terazosin           Current Medications (12/25/2022):  This is the current hospital active medication list Current Facility-Administered Medications  Medication Dose Route Frequency Provider Last Rate Last Admin   acetaminophen (TYLENOL) tablet 650 mg  650 mg Oral TID Emeline General, MD   650 mg  at 12/25/22 0903   aspirin EC tablet 81 mg  81 mg Oral Daily Mikey College T, MD   81 mg at 12/25/22 5784   carbidopa-levodopa (SINEMET IR) 10-100 MG per tablet immediate release 1 tablet  1 tablet Oral TID Emeline General, MD   1 tablet at 12/25/22 0903   ciprofloxacin (CILOXAN) 0.3 % ophthalmic  solution 1 drop  1 drop Left Eye Q4H while awake Mikey College T, MD   1 drop at 12/25/22 1346   enoxaparin (LOVENOX) injection 40 mg  40 mg Subcutaneous Q24H Mikey College T, MD   40 mg at 12/24/22 2228   finasteride (PROSCAR) tablet 5 mg  5 mg Oral Daily Mikey College T, MD   5 mg at 12/25/22 6962   hydrALAZINE (APRESOLINE) injection 10 mg  10 mg Intravenous Q6H PRN Mikey College T, MD   10 mg at 12/24/22 1650   lactated ringers infusion   Intravenous Continuous Arnetha Courser, MD       [name] ON 12/27/2022] levothyroxine (SYNTHROID) tablet 175 mcg  175 mcg Oral Daily Arnetha Courser, MD       levothyroxine (SYNTHROID, LEVOTHROID) injection 150 mcg  150 mcg Intravenous Daily Arnetha Courser, MD   150 mcg at 12/25/22 1347   methocarbamol (ROBAXIN) tablet 500 mg  500 mg Oral Q8H PRN Mikey College T, MD       mirabegron ER [facility]) tablet 25 mg  25 mg Oral Daily Mikey College T, MD   25 mg at 12/25/22 9528   polyvinyl alcohol (LIQUIFILM TEARS) 1.4 % ophthalmic solution 1 drop  1 drop Both Eyes PRN Mikey College T, MD       simvastatin (ZOCOR) tablet 10 mg  10 mg Oral QHS Mikey College T, MD   10 mg at 12/24/22 2220   sodium chloride flush (NS) 0.9 % injection 3 mL  3 mL Intravenous Q12H Mikey College T, MD   3 mL at 12/25/22 0904   tamsulosin (FLOMAX) capsule 0.4 mg  0.4 mg Oral QPC supper Arnetha Courser, MD         Discharge Medications: Please see discharge summary for a list of discharge medications.  Relevant Imaging Results:  Relevant Lab Results:   Additional Information [id number]  Garret Reddish, RN

## 2022-12-25 NOTE — Assessment & Plan Note (Addendum)
Patient with continuous decline over the past few month, recently released from rehab. Outpatient PT and OT are concerning for Parkinson's due to worsening rigidity, shuffling gait and very slow response.  No diagnosis yet. Positive orthostatic vitals especially with standing.  CT head without any significant abnormality.  Echocardiogram with normal EF and only grade 1 diastolic dysfunction. TSH significantly elevated-not sure whether he was taking his medications regularly or not.  Concern of vasovagal versus autonomic dysfunction with Parkinson's -Patient was started on Sinemet trial by admitting provider-some improvement in tremor but Sinemet itself can cause orthostatic.  If it is atypical Parkinson's then he might not get benefit from Sinemet. -Need outpatient neurology evaluation -MRI brain was ordered as advised by neurology -Palliative care consult -Continue with Sinemet for now

## 2022-12-25 NOTE — Assessment & Plan Note (Signed)
-  Consult to dietitian

## 2022-12-25 NOTE — Assessment & Plan Note (Signed)
TSH significantly elevated above 10, not sure whether he was taking his home Synthroid regularly or not.  It can also contribute with slow response and intermittent confusion. -Increasing the dose of home Synthroid. -Patient will need a repeat testing in 2 to 3 weeks

## 2022-12-25 NOTE — Plan of Care (Signed)
  Problem: Education: Goal: Knowledge of condition and prescribed therapy will improve 12/25/2022 0201 by Randa Evens, RN Outcome: Progressing 12/24/2022 2355 by Randa Evens, RN Outcome: Progressing   Problem: Cardiac: Goal: Will achieve and/or maintain adequate cardiac output 12/25/2022 0201 by Randa Evens, RN Outcome: Progressing 12/24/2022 2355 by Randa Evens, RN Outcome: Progressing   Problem: Physical Regulation: Goal: Complications related to the disease process, condition or treatment will be avoided or minimized 12/25/2022 0201 by Randa Evens, RN Outcome: Progressing 12/24/2022 2355 by Randa Evens, RN Outcome: Progressing   Problem: Education: Goal: Knowledge of General Education information will improve Description: Including pain rating scale, medication(s)/side effects and non-pharmacologic comfort measures 12/25/2022 0201 by Randa Evens, RN Outcome: Progressing 12/24/2022 2355 by Randa Evens, RN Outcome: Progressing   Problem: Health Behavior/Discharge Planning: Goal: Ability to manage health-related needs will improve Outcome: Progressing   Problem: Clinical Measurements: Goal: Ability to maintain clinical measurements within normal limits will improve Outcome: Progressing Goal: Will remain free from infection Outcome: Progressing Goal: Diagnostic test results will improve Outcome: Progressing Goal: Respiratory complications will improve Outcome: Progressing Goal: Cardiovascular complication will be avoided Outcome: Progressing   Problem: Activity: Goal: Risk for activity intolerance will decrease Outcome: Progressing   Problem: Nutrition: Goal: Adequate nutrition will be maintained 12/25/2022 0201 by Randa Evens, RN Outcome: Progressing 12/24/2022 2355 by Randa Evens, RN Outcome: Progressing   Problem: Coping: Goal: Level of anxiety will decrease Outcome: Progressing   Problem: Elimination: Goal: Will  not experience complications related to bowel motility Outcome: Progressing Goal: Will not experience complications related to urinary retention Outcome: Progressing   Problem: Pain Managment: Goal: General experience of comfort will improve Outcome: Progressing   Problem: Safety: Goal: Ability to remain free from injury will improve Outcome: Progressing   Problem: Skin Integrity: Goal: Risk for impaired skin integrity will decrease 12/25/2022 0201 by Randa Evens, RN Outcome: Progressing 12/24/2022 2355 by Randa Evens, RN Outcome: Progressing

## 2022-12-26 DIAGNOSIS — Z8042 Family history of malignant neoplasm of prostate: Secondary | ICD-10-CM | POA: Diagnosis not present

## 2022-12-26 DIAGNOSIS — R251 Tremor, unspecified: Secondary | ICD-10-CM | POA: Diagnosis not present

## 2022-12-26 DIAGNOSIS — R55 Syncope and collapse: Secondary | ICD-10-CM | POA: Diagnosis present

## 2022-12-26 DIAGNOSIS — R350 Frequency of micturition: Secondary | ICD-10-CM | POA: Diagnosis present

## 2022-12-26 DIAGNOSIS — E7800 Pure hypercholesterolemia, unspecified: Secondary | ICD-10-CM | POA: Diagnosis present

## 2022-12-26 DIAGNOSIS — G20C Parkinsonism, unspecified: Secondary | ICD-10-CM | POA: Diagnosis not present

## 2022-12-26 DIAGNOSIS — R7303 Prediabetes: Secondary | ICD-10-CM | POA: Diagnosis present

## 2022-12-26 DIAGNOSIS — N1831 Chronic kidney disease, stage 3a: Secondary | ICD-10-CM | POA: Diagnosis present

## 2022-12-26 DIAGNOSIS — I2510 Atherosclerotic heart disease of native coronary artery without angina pectoris: Secondary | ICD-10-CM | POA: Diagnosis present

## 2022-12-26 DIAGNOSIS — T446X5A Adverse effect of alpha-adrenoreceptor antagonists, initial encounter: Secondary | ICD-10-CM | POA: Diagnosis present

## 2022-12-26 DIAGNOSIS — I129 Hypertensive chronic kidney disease with stage 1 through stage 4 chronic kidney disease, or unspecified chronic kidney disease: Secondary | ICD-10-CM | POA: Diagnosis present

## 2022-12-26 DIAGNOSIS — R296 Repeated falls: Secondary | ICD-10-CM | POA: Diagnosis present

## 2022-12-26 DIAGNOSIS — I951 Orthostatic hypotension: Secondary | ICD-10-CM | POA: Diagnosis present

## 2022-12-26 DIAGNOSIS — Z961 Presence of intraocular lens: Secondary | ICD-10-CM | POA: Diagnosis present

## 2022-12-26 DIAGNOSIS — N179 Acute kidney failure, unspecified: Secondary | ICD-10-CM | POA: Diagnosis present

## 2022-12-26 DIAGNOSIS — Z66 Do not resuscitate: Secondary | ICD-10-CM | POA: Diagnosis present

## 2022-12-26 DIAGNOSIS — W19XXXA Unspecified fall, initial encounter: Secondary | ICD-10-CM | POA: Diagnosis not present

## 2022-12-26 DIAGNOSIS — Z9841 Cataract extraction status, right eye: Secondary | ICD-10-CM | POA: Diagnosis not present

## 2022-12-26 DIAGNOSIS — Z6823 Body mass index (BMI) 23.0-23.9, adult: Secondary | ICD-10-CM | POA: Diagnosis not present

## 2022-12-26 DIAGNOSIS — N401 Enlarged prostate with lower urinary tract symptoms: Secondary | ICD-10-CM | POA: Diagnosis present

## 2022-12-26 DIAGNOSIS — Z9842 Cataract extraction status, left eye: Secondary | ICD-10-CM | POA: Diagnosis not present

## 2022-12-26 DIAGNOSIS — D72829 Elevated white blood cell count, unspecified: Secondary | ICD-10-CM | POA: Diagnosis not present

## 2022-12-26 DIAGNOSIS — Z515 Encounter for palliative care: Secondary | ICD-10-CM | POA: Diagnosis not present

## 2022-12-26 DIAGNOSIS — E440 Moderate protein-calorie malnutrition: Secondary | ICD-10-CM | POA: Diagnosis present

## 2022-12-26 DIAGNOSIS — E039 Hypothyroidism, unspecified: Secondary | ICD-10-CM | POA: Diagnosis present

## 2022-12-26 DIAGNOSIS — G20A1 Parkinson's disease without dyskinesia, without mention of fluctuations: Secondary | ICD-10-CM | POA: Diagnosis present

## 2022-12-26 DIAGNOSIS — I081 Rheumatic disorders of both mitral and tricuspid valves: Secondary | ICD-10-CM | POA: Diagnosis present

## 2022-12-26 DIAGNOSIS — F22 Delusional disorders: Secondary | ICD-10-CM | POA: Diagnosis present

## 2022-12-26 DIAGNOSIS — I739 Peripheral vascular disease, unspecified: Secondary | ICD-10-CM | POA: Diagnosis present

## 2022-12-26 LAB — BLOOD GAS, ARTERIAL
Acid-Base Excess: 3.1 mmol/L — ABNORMAL HIGH (ref 0.0–2.0)
Bicarbonate: 27 mmol/L (ref 20.0–28.0)
O2 Saturation: 96.8 %
Patient temperature: 37
pCO2 arterial: 38 mmHg (ref 32–48)
pH, Arterial: 7.46 — ABNORMAL HIGH (ref 7.35–7.45)
pO2, Arterial: 73 mmHg — ABNORMAL LOW (ref 83–108)

## 2022-12-26 LAB — THYROID PANEL WITH TSH
Free Thyroxine Index: 3.1 (ref 1.2–4.9)
T3 Uptake Ratio: 37 % (ref 24–39)
T4, Total: 8.5 ug/dL (ref 4.5–12.0)
TSH: 3.89 u[IU]/mL (ref 0.450–4.500)

## 2022-12-26 LAB — GLUCOSE, CAPILLARY
Glucose-Capillary: 111 mg/dL — ABNORMAL HIGH (ref 70–99)
Glucose-Capillary: 105 mg/dL — ABNORMAL HIGH (ref 70–99)
Glucose-Capillary: 112 mg/dL — ABNORMAL HIGH (ref 70–99)

## 2022-12-26 NOTE — Progress Notes (Signed)
Daily Progress Note   Patient Name: Richard Castaneda       Date: 12/26/2022 DOB: 09/19/1929  Age: 89 y.o. MRN#: [id number] Attending Physician: Arnetha Courser, MD Primary Care Physician: Marisue Ivan, MD Admit Date: 12/24/2022  Reason for Consultation/Follow-up: Establishing goals of care  HPI/Brief Hospital Review:  89 y.o. male  with past medical history of HTN, hypothyroidism, CAD, CKD and PVD admitted from home on 12/24/2022 with syncope episode.   Son reports assisting Mr. Capobianco to restroom, on his return to assist Mr. Sancen he found him unresponsive, attempted to awaked Mr. Weininger for several minutes but then notices changes in respirations which prompted him to call EMS   On arrival to ED, nurse staff witnessed recurrent syncope event, spontaneously returned to baseline   CT head (-) CTA (-) for dissection  8/24: Somnolent during assessment and does not respond to verbal stimuli-primary team aware. ABG pending   Palliative medicine was consulted for assisting with goals of care conversations.  Subjective: Extensive chart review has been completed prior to meeting patient including labs, vital signs, imaging, progress notes, orders, and available advanced directive documents from current and previous encounters.    Visited at bedside, assessment as above, does not appear to be in acute distress or discomfort. Son-Tony at bedside during time of visit. [name] voicing concerns related to current presentation compared to yesterday. He has been able to speak with primary team, again etiology unclear (possible atypical Parkinson's).  [name] wanting to discuss options in the event Mr. Filipek does not recover from this. We discussed shifting our focus to a comfort care approach. Mr.  Hartzog would no longer receive aggressive medical interventions such as continuous vital signs, lab work, radiology testing, or medications not focused on comfort. All care would focus on how the patient is looking and feeling. This would include management of any symptoms that may cause discomfort, pain, shortness of breath, cough, nausea, agitation, anxiety, and/or secretions etc. Symptoms would be managed with medications and other non-pharmacological interventions such as spiritual support if requested, repositioning, music therapy, or therapeutic listening. Family verbalized understanding and appreciation. We also discussed the overall philosophy of hospice and basic eligibility and benefits covered. [name] shares his goal is to be able to care for Ms. Grimm at his home.  [name] requests a day or so  to allow for outcomes to see if Mr. Burandt will become more responsive. [name] requests to speak with someone from Hospice directly to discuss more in depth.  Answered and addressed all questions and concerns. PMT to continue to follow for ongoing needs and support.    Palliative Care Assessment & Plan   Assessment/Recommendation/Plan  DNR Allow time for outcomes TOC made aware of referral, choice offered and called and spoke with liaison to offer report PMT to continue to follow for ongoing needs and support  Thank you for allowing the Palliative Medicine Team to assist in the care of this patient.  Total time:  35 minutes  Time spent includes: Detailed review of medical records (labs, imaging, vital signs), medically appropriate exam (mental status, respiratory, cardiac, skin), discussed with treatment team, counseling and educating patient, family and staff, documenting clinical information, medication management and coordination of care.  Leeanne Deed, DNP, AGNP-C Palliative Medicine   Please contact Palliative Medicine Team phone at [phone] for questions and concerns.

## 2022-12-26 NOTE — Assessment & Plan Note (Signed)
Patient with continuous decline over the past few month, recently released from rehab. Outpatient PT and OT are concerning for Parkinson's due to worsening rigidity, shuffling gait and very slow response.  No diagnosis yet. Positive orthostatic vitals especially with standing.  CT head and MRI brain without any significant abnormality.  Echocardiogram with normal EF and only grade 1 diastolic dysfunction. TSH significantly elevated-not sure whether he was taking his medications regularly or not.  Repeat TSH with thyroid panel today was normal. Concern of vasovagal versus autonomic dysfunction with Parkinson's -Patient was started on Sinemet trial by admitting provider-some improvement in tremor but Sinemet itself can cause orthostatic.  If it is atypical Parkinson's then he might not get benefit from Sinemet. -Need outpatient neurology evaluation -Palliative care consult -Continue with Sinemet for now

## 2022-12-26 NOTE — Progress Notes (Signed)
ABG reports comes with PO2 73, PH 7.46 and acid base excess 3.1 . MD made aware and administered oxygen 2lit /min via nasal cannula as order.Plan of care ongoing.

## 2022-12-26 NOTE — Plan of Care (Signed)

## 2022-12-26 NOTE — Progress Notes (Signed)
Patient is only response to voice and feels lethargic. MD made aware. No any new order received. Plan of care ongoing.

## 2022-12-26 NOTE — Progress Notes (Signed)
Progress Note   Patient: Richard Castaneda ION:[id number] DOB: 27-Dec-1929 DOA: 12/24/2022     0 DOS: the patient was seen and examined on 12/26/2022   Brief hospital course: Taken from H&P.  Richard Castaneda is a 89 y.o. male with medical history significant of HTN, hypothyroidism, BPH, presented with syncope.   Per son patient had a fall 63-month ago, broke his wrist and discharged from nursing home later on.  Since then continued to deteriorate, frequent episodes of confusion with gait and ambulation issues.  Recently home PT team recommended patient get evaluated for Parkinson's due to small shuffling gait and significant worsening of tremor and rigidity in his arms.  Son found patient on toilet, slumped over to the side but still sitting, unconscious, remained unconscious for about 5 to 7 minutes, he was cool to touch and appeared pale and breathing heavily during that time.  EMS was called.  While in the ED patient had another episode of syncope, lasted for 5 minutes, during that time patient was found drooling and there was significant disparity of blood pressure on right and left arm.  Due to concern of aortic aneurysm or dissection, CTA dissection was ordered and came back negative.  CT head was negative for any acute abnormality.  Chest x-ray negative for any acute infiltrate.   8/23: Hemodynamically stable, positive orthostatic vitals with 30 degrees drop from sitting to standing.  Some improvement in tremor with the start of Sinemet but it can worsen postural hypotension.  Based on his history and progressive decline over the past few months, concern of typical versus atypical Parkinson's.  Discussed with neurology and they were recommending outpatient neurology evaluation.  Also found to have significantly elevated TSH above 10, not sure whether he was taking his home Synthroid or not but dose was increased and need to have a close follow-up with PCP and repeat TSH in 3 to 4  weeks. Echocardiogram was normal with normal EF, grade 1 diastolic dysfunction and no other significant abnormality.  Giving some IV fluid. PT is recommending SNF, TOC to look for his benefit as he recently discharged from rehab.  8/24: Patient more somnolent and lethargic this morning, able to eat couple of bites with the help of son, just moving his extremities on deep sternal rub during morning rounds.  Getting ABG to rule out hypercarbia.  Palliative care is on board   Assessment and Plan: * Syncope Patient with continuous decline over the past few month, recently released from rehab. Outpatient PT and OT are concerning for Parkinson's due to worsening rigidity, shuffling gait and very slow response.  No diagnosis yet. Positive orthostatic vitals especially with standing.  CT head and MRI brain without any significant abnormality.  Echocardiogram with normal EF and only grade 1 diastolic dysfunction. TSH significantly elevated-not sure whether he was taking his medications regularly or not.  Repeat TSH with thyroid panel today was normal. Concern of vasovagal versus autonomic dysfunction with Parkinson's -Patient was started on Sinemet trial by admitting provider-some improvement in tremor but Sinemet itself can cause orthostatic.  If it is atypical Parkinson's then he might not get benefit from Sinemet. -Need outpatient neurology evaluation -Palliative care consult -Continue with Sinemet for now  Fall at home, initial encounter Patient with worsening ambulatory dysfunction, becoming very slow with shuffling gait and tremors.  Intermittent confusion and very slow response. -PT and OT are recommending SNF -Son is okay if insurance will pay-TOC to check his benefit as he recently came home  from rehab  PVD (peripheral vascular disease) (HCC) -Continue aspirin and statin  CKD (chronic kidney disease) stage 3, GFR 30-59 ml/min (HCC) Seems stable rather some improvement in creatinine from  baseline.  Appears to have CKD stage IIIa. -Monitor renal function -Avoid nephrotoxins  Benign prostatic hyperplasia with urinary frequency -Continue home finasteride and Myrbetriq  Malnutrition of moderate degree -Consult to dietitian  Essential hypertension Blood pressure mildly elevated this morning.  Per son he was recently started back on amlodipine which was held for many months due to concern of hypotension. -Continue to monitor with as needed for now  Hypothyroidism TSH significantly elevated above 10, not sure whether he was taking his home Synthroid regularly or not.  It can also contribute with slow response and intermittent confusion.  Repeat TSH with thyroid panel was normal -Increasing the dose of home Synthroid. -Patient will need a repeat testing in 2 to 3 weeks   Subjective: Patient deeply asleep, only moving extremities on deep sternal rub, never opened his eyes, son at bedside and according to him he was able to take couple of bites with his help but mostly remained sleeping.  Physical Exam: Vitals:   12/26/22 0159 12/26/22 0501 12/26/22 0802 12/26/22 1121  BP:  (!) 164/119 (!) 137/57 (!) 106/51  Pulse:  93 83 76  Resp:  20 16 16   Temp:  99.2 F (37.3 C) 97.8 F (36.6 C) 98.7 F (37.1 C)  TempSrc:  Oral  Oral  SpO2:  92% 93% 94%  Weight: 67.6 kg      General.  Chronically ill-appearing, lethargic elderly man, in no acute distress. Pulmonary.  Lungs clear bilaterally, normal respiratory effort. CV.  Regular rate and rhythm, no JVD, rub or murmur. Abdomen.  Soft, nontender, nondistended, BS positive. CNS.  Somnolent, only moving extremities with deep sternal rub Extremities.  No edema, no cyanosis, pulses intact and symmetrical.   Data Reviewed: Prior data reviewed  Family Communication: Discussed with son at bedside  Disposition: Status is: Observation The patient will require care spanning > 2 midnights and should be moved to inpatient because:  Severity of illness  Planned Discharge Destination: Skilled nursing facility  DVT prophylaxis.  Lovenox Time spent: 50 minutes  This record has been created using Conservation officer, historic buildings. Errors have been sought and corrected,but may not always be located. Such creation errors do not reflect on the standard of care.   Author: Arnetha Courser, MD 12/26/2022 2:14 PM  For on call review [email or web address].

## 2022-12-26 NOTE — Progress Notes (Signed)
Insurance claims handler Liaison Referral Note  Received request from Allena Katz, SW Transitions of Care Manager, for hospice services at home after discharge. Spoke with Richard Castaneda, son, to initiate education related to hospice philosophy, services, and team approach to care. Patient/family verbalized understanding of information given.   Per discussion- family and team at [facility] are trying to figure out what's going on with Richard Castaneda and are awaiting consult and more testing.  They are interested in taking Richard Castaneda home with hospice care, depending on findings over next day or two.  DME needs discussed. Patient has the following equipment in the home:  rollator, wheelchair, walker  DME Needs:  To be determined closer to d/c plan.  Pharmacy used:  [facility] in [place]  The address has been verified and is correct in the chart. Contact patient's son, Richard Castaneda [phone] for DME delivery.  Please send signed and completed DNR home with patient/family if applicable. Please provide prescriptions at discharge as needed to ensure ongoing symptom management.  [facility] information and contact numbers given to Richard Castaneda, son.  Above information shared with Allena Katz, SW Transitions of Care Manager and team caring for Richard Castaneda.  Please call with any hospice related questions or concerns.  Thank you for the opportunity to participate in this patient's care.  Norris Cross, RN Nurse Liaison [phone]

## 2022-12-26 NOTE — TOC Progression Note (Signed)
Transition of Care [facility]) - Progression Note    Patient Details  Name: Richard Castaneda MRN: [id number] Date of Birth: 06-Apr-1930  Transition of Care [facility] CM/SW Contact  Allena Katz, LCSW Phone Number: 12/26/2022, 12:44 PM  Clinical Narrative:   CSW spoke with patients daughter about choice of agency. She would like to use [facility] for home hospice. CSW referred to kara with hospice.     Expected Discharge Plan: Skilled Nursing Facility Barriers to Discharge: No Barriers Identified  Expected Discharge Plan and Services In-house Referral: Clinical Social Work   Post Acute Care Choice: Skilled Nursing Facility Living arrangements for the past 2 months: Single Family Home                     Date DME Agency Contacted:  (Patient was active with [facility] prior to admission)         Date HH Agency Contacted:  (Patient was active with [facility] for PT/OT prior to admission)       Social Determinants of Health (SDOH) Interventions SDOH Screenings   Tobacco Use: Low Risk  (12/24/2022)    Readmission Risk Interventions    08/15/2021    3:18 PM  Readmission Risk Prevention Plan  Post Dischage Appt Complete  Medication Screening Complete  Transportation Screening Complete

## 2022-12-26 NOTE — Assessment & Plan Note (Signed)
TSH significantly elevated above 10, not sure whether he was taking his home Synthroid regularly or not.  It can also contribute with slow response and intermittent confusion.  Repeat TSH with thyroid panel was normal -Increasing the dose of home Synthroid. -Patient will need a repeat testing in 2 to 3 weeks

## 2022-12-26 NOTE — TOC Progression Note (Signed)
Transition of Care [facility]) - Progression Note    Patient Details  Name: Richard Castaneda MRN: [id number] Date of Birth: [birth year]  [facility] CM/SW Contact  Allena Katz, LCSW Phone Number: 12/26/2022, 10:20 AM  Clinical Narrative:   Message sent to Tiffany at [facility] to follow up on if they can accept pt.     Expected Discharge Plan: Skilled Nursing Facility Barriers to Discharge: No Barriers Identified  Expected Discharge Plan and Services In-house Referral: Clinical Social Work   Post Acute Care Choice: Skilled Nursing Facility Living arrangements for the past 2 months: Single Family Home                     Date DME Agency Contacted:  (Patient was active with [facility] prior to admission)         Date HH Agency Contacted:  (Patient was active with [facility] for PT/OT prior to admission)       Social Determinants of Health (SDOH) Interventions SDOH Screenings   Tobacco Use: Low Risk  (12/24/2022)    Readmission Risk Interventions    08/15/2021    3:18 PM  Readmission Risk Prevention Plan  Post Dischage Appt Complete  Medication Screening Complete  Transportation Screening Complete

## 2022-12-27 ENCOUNTER — Inpatient Hospital Stay: Payer: Medicare Other

## 2022-12-27 DIAGNOSIS — R55 Syncope and collapse: Secondary | ICD-10-CM | POA: Diagnosis not present

## 2022-12-27 DIAGNOSIS — N179 Acute kidney failure, unspecified: Secondary | ICD-10-CM | POA: Diagnosis not present

## 2022-12-27 DIAGNOSIS — Z515 Encounter for palliative care: Secondary | ICD-10-CM | POA: Diagnosis not present

## 2022-12-27 DIAGNOSIS — G20C Parkinsonism, unspecified: Secondary | ICD-10-CM

## 2022-12-27 DIAGNOSIS — R251 Tremor, unspecified: Secondary | ICD-10-CM | POA: Diagnosis not present

## 2022-12-27 DIAGNOSIS — E440 Moderate protein-calorie malnutrition: Secondary | ICD-10-CM | POA: Diagnosis not present

## 2022-12-27 LAB — GLUCOSE, CAPILLARY
Glucose-Capillary: 102 mg/dL — ABNORMAL HIGH (ref 70–99)
Glucose-Capillary: 149 mg/dL — ABNORMAL HIGH (ref 70–99)

## 2022-12-27 MED ORDER — BENZONATATE 100 MG PO CAPS
100.0000 mg | ORAL_CAPSULE | Freq: Three times a day (TID) | ORAL | Status: DC | PRN
  Administered 2022-12-27: 100 mg via ORAL
  Filled 2022-12-27: qty 1

## 2022-12-27 MED ORDER — CARBIDOPA-LEVODOPA ER 25-100 MG PO TBCR
0.5000 | EXTENDED_RELEASE_TABLET | Freq: Three times a day (TID) | ORAL | Status: DC
  Administered 2022-12-27 – 2022-12-28 (×3): 0.5 via ORAL
  Filled 2022-12-27 (×4): qty 0.5

## 2022-12-27 NOTE — Evaluation (Signed)
Clinical/Bedside Swallow Evaluation Patient Details  Name: Richard Castaneda MRN: [id number] Date of Birth: Jul 04, 1929  Today's Date: 12/27/2022 Time: SLP Start Time (ACUTE ONLY): 1038 SLP Stop Time (ACUTE ONLY): 1056 SLP Time Calculation (min) (ACUTE ONLY): 18 min  Past Medical History:  Past Medical History:  Diagnosis Date   Acquired hypothyroidism 11/16/2016   AKI (acute kidney injury) (HCC) 08/14/2021   Anterior myocardial infarction (HCC) 12/29/2013   Overview:  2004   Benign essential HTN 12/29/2013   Benign prostatic hyperplasia with urinary frequency 10/19/2016   Borderline diabetes mellitus 11/16/2016   Coronary artery disease 12/29/2013   Overview:  Anterior MI, PCI and stent placement of LAD11/04   H/O iron deficiency anemia 09/09/2015   History of BPH    Inguinal hernia, bilateral    Moderate tricuspid insufficiency 09/12/2014   Occasional tremors    Left hand only with writing and eating   Pre-diabetes    Primary osteoarthritis of left knee 08/26/2016   Pure hypercholesterolemia 12/29/2013   PVD (peripheral vascular disease) (HCC) 12/29/2013   Overview:  With carotid atherosclerosis    Past Surgical History:  Past Surgical History:  Procedure Laterality Date   CATARACT EXTRACTION W/ INTRAOCULAR LENS  IMPLANT, BILATERAL     CORONARY ANGIOPLASTY  2004   1 stent   EYE SURGERY Bilateral    Cataract Extraction with IOL   HERNIA REPAIR Bilateral    Inguinal Hernia Repair   PARTIAL KNEE ARTHROPLASTY Left 12/31/2016   Procedure: UNICOMPARTMENTAL KNEE;  Surgeon: Christena Flake, MD;  Location: [facility] ORS;  Service: Orthopedics;  Laterality: Left;   TOTAL KNEE REVISION Left 06/22/2017   Procedure: TOTAL KNEE REVISION, CONVERTING A PARTIAL KNEE TO A TOTAL;  Surgeon: Christena Flake, MD;  Location: [facility] ORS;  Service: Orthopedics;  Laterality: Left;   HPI:  Pt is a 89y/o male presenting due to 2 syncopal episodes, one while on the toilet at home and another in ED.  PMH includes  history of falls, hypertension, mild dementia, CAD, CKD, peripheral vascular disease, and hypothyroidism. MRI 12/25/22, "1. No acute intracranial process.  2. Stable mild chronic small-vessel disease." CXR, 12/24/22, "1. Increased central vascular congestion without overt edema."    Assessment / Plan / Recommendation  Clinical Impression  Pt seen for clinical swallowing evaluation. Pt alert and cooperative. Requires cueing for POs and repositioning assistance. Pt slow to respond at times. Pt kept eyes closed the majority of session. On 2L/min O2 via [place]. Pt's daughter at bedside feeding pt breakfast upon SLP entrance to room. Per daughter, pt with increased coughing with POs the "last few weeks." Pt presents with s/sx moderate oral and concern for suspected pharyngeal dysphagia. Orally, pt with oral holding and delayed oral transit across trials, but most pronounced with thin liquids. Solid trial deferred due to daughter report of prolonged/inefficient mastication of scrambled eggs. Pharyngeally, pt with immediate cough with thin liquids via cup and straw which is concerning for pharyngeal dysphagia. Recommend downgrade to a puree diet with nectar-thick liquids and safe swallowing strategies/aspiration precautions as outlined below. Pt is at increased risk for aspiration/aspiration PNA given medical comorbidities, dependence for feeding at present, and overall clinical presensation. Noted pt with Parkinson-like symptoms and started on Sinemet this admission. Palliative Care following. SLP to f/u per POC for diet tolerance, trials of upgraded textures, and consideration for MBSS as clinically indicated. SLP Visit Diagnosis: Dysphagia, oropharyngeal phase (R13.12)    Aspiration Risk  Mild aspiration risk;Moderate aspiration risk    Diet  Recommendation Dysphagia 1 (Puree);Nectar-thick liquid    Medication Administration: Crushed with puree Supervision: Staff to assist with self feeding;Full supervision/cueing  for compensatory strategies Compensations: Minimize environmental distractions;Slow rate;Small sips/bites Postural Changes: Seated upright at 90 degrees;Remain upright for at least 30 minutes after po intake    Other  Recommendations Oral Care Recommendations: Oral care BID;Staff/trained caregiver to provide oral care Caregiver Recommendations: Remove water pitcher    Recommendations for follow up therapy are one component of a multi-disciplinary discharge planning process, led by the attending physician.  Recommendations may be updated based on patient status, additional functional criteria and insurance authorization.  Follow up Recommendations Follow physician's recommendations for discharge plan and follow up therapies         Functional Status Assessment Patient has had a recent decline in their functional status and demonstrates the ability to make significant improvements in function in a reasonable and predictable amount of time.  Frequency and Duration min 2x/week  2 weeks       Prognosis Prognosis for improved oropharyngeal function: Fair Barriers to Reach Goals: Severity of deficits      Swallow Study   General Date of Onset: 12/24/22 (admit date) HPI: Pt is a 89y/o male presenting due to 2 syncopal episodes, one while on the toilet at home and another in ED.  PMH includes history of falls, hypertension, mild dementia, CAD, CKD, peripheral vascular disease, and hypothyroidism. MRI 12/25/22, "1. No acute intracranial process.  2. Stable mild chronic small-vessel disease." CXR, 12/24/22, "1. Increased central vascular congestion without overt edema." Type of Study: Bedside Swallow Evaluation Previous Swallow Assessment: none Diet Prior to this Study: Regular;Thin liquids (Level 0) Temperature Spikes Noted: No Respiratory Status: Nasal cannula (2L/min) History of Recent Intubation: No Behavior/Cognition: Alert;Cooperative;Requires cueing Oral Cavity Assessment: Within  Functional Limits Oral Care Completed by SLP: No Oral Cavity - Dentition: Adequate natural dentition Self-Feeding Abilities: Total assist Patient Positioning: Upright in bed Baseline Vocal Quality: Low vocal intensity Volitional Cough: Strong Volitional Swallow: Able to elicit    Oral/Motor/Sensory Function Overall Oral Motor/Sensory Function: Generalized oral weakness   Ice Chips Ice chips: Not tested   Thin Liquid Thin Liquid: Impaired Presentation: Cup;Straw Oral Phase Impairments: Poor awareness of bolus (oral holding) Oral Phase Functional Implications: Oral holding ("swishing") Pharyngeal  Phase Impairments: Cough - Immediate;Suspected delayed Swallow    Nectar Thick Nectar Thick Liquid: Impaired Presentation: Cup;Spoon;Straw Oral Phase Impairments: Poor awareness of bolus Oral phase functional implications: Oral holding Pharyngeal Phase Impairments: Suspected delayed Swallow (appeared WFL)   Honey Thick Honey Thick Liquid: Not tested   Puree Puree: Impaired Presentation: Spoon Oral Phase Functional Implications: Oral holding Pharyngeal Phase Impairments: Suspected delayed Swallow   Solid     Solid: Not tested Other Comments: daughter reports inefficient mastication with scrambled eggs; deferred solid trials at this time     Clyde Canterbury, M.S., CCC-SLP Speech-Language Pathologist [facility] [phone] Arnette Felts)  Woodroe Chen 12/27/2022,12:08 PM

## 2022-12-27 NOTE — Progress Notes (Signed)
Patient received from x-ray via bed in stable condition.

## 2022-12-27 NOTE — Assessment & Plan Note (Signed)
Patient with worsening ambulatory dysfunction, becoming very slow with shuffling gait and tremors.  Intermittent confusion and very slow response. -PT and OT are recommending SNF -Family is not leaning towards taking him back home with hospice help

## 2022-12-27 NOTE — Progress Notes (Signed)
                                                                                                                                                                                                           Daily Progress Note   Patient Name: Richard Castaneda       Date: 12/27/2022 DOB: [birth year]  Age: 89 y.o. MRN#: [id number] Attending Physician: Richard Courser, MD Primary Care Physician: Richard Ivan, MD Admit Date: 12/24/2022  Reason for Consultation/Follow-up: Establishing goals of care  HPI/Brief Hospital Review:  89 y.o. male  with past medical history of HTN, hypothyroidism, CAD, CKD and PVD admitted from home on 12/24/2022 with syncope episode.   Son reports assisting Richard Castaneda to restroom, on his return to assist Richard Castaneda he found him unresponsive, attempted to awaked Richard Castaneda for several minutes but then notices changes in respirations which prompted him to call EMS   On arrival to ED, nurse staff witnessed recurrent syncope event, spontaneously returned to baseline   CT head (-) CTA (-) for dissection   8/24: Somnolent during assessment and does not respond to verbal stimuli-primary team aware. 8/25: Remains drowsy, a bit more alert compared to yesterday, concern for aspiration, diet has been downgraded  Palliative medicine was consulted for assisting with goals of care conversations.  Subjective: Extensive chart review has been completed prior to meeting patient including labs, vital signs, imaging, progress notes, orders, and available advanced directive documents from current and previous encounters.    Visited with Richard Castaneda at his bedside. Resting in bed comfortably with eyes closed, acknowledges my presence with occasional eye opening. Daughter-Richard Castaneda at bedside.  Richard Castaneda shares collectively as a family they have decided to have hospice services follow in their home. They will continue to have private caregivers come in to also support Richard Castaneda during this time.  Richard Castaneda with specific questions for hospice liaison-[facility] made aware.  Spoke with primary team and made aware of plan moving forward.  Care plan was discussed with primary team and Hospice liaison.  Thank you for allowing the Palliative Medicine Team to assist in the care of this patient.  Total time:  25 minutes  Time spent includes: Detailed review of medical records (labs, imaging, vital signs), medically appropriate exam (mental status, respiratory, cardiac, skin), discussed with treatment team, counseling and educating patient, family and staff, documenting clinical information, medication management and coordination of care.  Richard Deed, DNP, AGNP-C Palliative Medicine   Please contact Palliative Medicine Team phone at [phone] for questions and concerns.

## 2022-12-27 NOTE — Progress Notes (Signed)
Patient sent for x-ray via bed in stable condition.

## 2022-12-27 NOTE — Progress Notes (Signed)
[facility] Referral Note  Follow up on hospice referral.  Spoke with Isabella Bowens, patient's daughter, who states they have decided to take her dad home with hospice services.  Patient is more awake today.    Discussed DME needs:  hospital bed, over bed table. DME in the home:  [facility], wheelchair, walker, rollator  Patient may discharge as early as tomorrow.  Isabella Bowens is primary contact for DME delivery and she can be reached at [phone].  Hospital Liaison team will continue to follow through final disposition.  Thank you for allowing participation in this patient's care.  Norris Cross ,RN Nurse Liaison [phone]

## 2022-12-27 NOTE — Consult Note (Signed)
Neurology Consultation Reason for Consult: Concern for parkinsonism Requesting Physician: Priscella Mann  CC: Falls, shuffling gait, syncope  History is obtained from: Granddaughter at bedside and chart review  HPI: Richard Castaneda is a 89 y.o. male with a past medical history significant for hypertension, hyperlipidemia, hypothyroidism, coronary artery disease s/p MI, CKD, peripheral vascular disease, moderate mitral valve and tricuspid valve insufficiency  He has been having increasing tremor which at times will spread to involve his whole body per family.  Granddaughter is not sure if he has had issues with sense of smell but he does have issues with constipation.  No clear history of REM sleep behavior disorder.  He has been having a cognitive decline with paranoia (thinking his caregivers were trying to kill him).  Home physical therapy was concerned about parkinsonian gait and had recommended an evaluation for Parkinson's disease.  He has an upcoming appointment with Dr. Sherryll Burger on 01/21/2023  He was started on Sinemet empirically by medicine team (10/100 tablet 3 times daily) and has been tolerating this well with marked improvement in his tremors and rigidity per family  ROS: Unable to obtain due to baseline dementia, obtained from family at bedside as able  Past Medical History:  Diagnosis Date   Acquired hypothyroidism 11/16/2016   AKI (acute kidney injury) (HCC) 08/14/2021   Anterior myocardial infarction (HCC) 12/29/2013   Overview:  2004   Benign essential HTN 12/29/2013   Benign prostatic hyperplasia with urinary frequency 10/19/2016   Borderline diabetes mellitus 11/16/2016   Coronary artery disease 12/29/2013   Overview:  Anterior MI, PCI and stent placement of LAD11/04   H/O iron deficiency anemia 09/09/2015   History of BPH    Inguinal hernia, bilateral    Moderate tricuspid insufficiency 09/12/2014   Occasional tremors    Left hand only with writing and eating    Pre-diabetes    Primary osteoarthritis of left knee 08/26/2016   Pure hypercholesterolemia 12/29/2013   PVD (peripheral vascular disease) (HCC) 12/29/2013   Overview:  With carotid atherosclerosis    Past Surgical History:  Procedure Laterality Date   CATARACT EXTRACTION W/ INTRAOCULAR LENS  IMPLANT, BILATERAL     CORONARY ANGIOPLASTY  2004   1 stent   EYE SURGERY Bilateral    Cataract Extraction with IOL   HERNIA REPAIR Bilateral    Inguinal Hernia Repair   PARTIAL KNEE ARTHROPLASTY Left 12/31/2016   Procedure: UNICOMPARTMENTAL KNEE;  Surgeon: Christena Flake, MD;  Location: [facility] ORS;  Service: Orthopedics;  Laterality: Left;   TOTAL KNEE REVISION Left 06/22/2017   Procedure: TOTAL KNEE REVISION, CONVERTING A PARTIAL KNEE TO A TOTAL;  Surgeon: Christena Flake, MD;  Location: [facility] ORS;  Service: Orthopedics;  Laterality: Left;     Family History  Problem Relation Age of Onset   Kidney failure Mother    Prostate cancer Paternal Uncle    Bladder Cancer Neg Hx    Kidney cancer Neg Hx     Social History:  reports that he has never smoked. He has never used smokeless tobacco. He reports that he does not drink alcohol and does not use drugs.  Exam: Current vital signs: BP (!) 150/67 (BP Location: Right Arm)   Pulse 68   Temp 97.8 F (36.6 C)   Resp 16   Wt 66.8 kg   SpO2 100%   BMI 23.77 kg/m  Vital signs in last 24 hours: Temp:  [97.7 F (36.5 C)-98.7 F (37.1 C)] 97.8 F (36.6 C) (08/25  1324) Pulse Rate:  [67-86] 68 (08/25 0806) Resp:  [16-20] 16 (08/25 0806) BP: (139-164)/(63-71) 150/67 (08/25 0806) SpO2:  [95 %-100 %] 100 % (08/25 0806) Weight:  [66.8 kg] 66.8 kg (08/25 0311)   Physical Exam  Constitutional: Appears frail and elderly Psych: Affect pleasant and cooperative, slow to respond Eyes: No scleral injection.  Some crusty discharge on the left eye HENT: No oropharyngeal obstruction.  MSK: no major joint deformities.  Cardiovascular: Normal rate and regular  rhythm. Perfusing extremities well Respiratory: Effort normal, non-labored breathing GI: Soft.  No distension. There is no tenderness.  Skin: Scattered bruises  Neuro: Mental Status: Patient is awake, alert, oriented to person, place, age but not month and unable to provide significant details of situation.  Unable to correctly identify granddaughter at bedside No signs of aphasia or neglect.  Poor attention/concentration Cranial Nerves: II: Visual Fields are full. .   III,IV, VI: EOMI without ptosis or diploplia.  Saccadic pursuits V: Facial sensation is symmetric to eyelash brush VII: Facial movement is symmetric, masked facies.  VIII: hearing is intact to voice X: Uvula elevates symmetrically XII: tongue is midline without atrophy or fasciculations.  Motor: Pill-rolling tremor of the right upper extremity.  Cogwheeling right greater than left upper extremity.  Decrementing finger taps.  Bradykinetic.  5/5 accounting for age/frailty throughout Sensory: Equally reactive to touch in all 4 extremities Deep Tendon Reflexes: 2+ and symmetric in the brachioradialis and patellae (though slightly challenging to be confident due to startle response on testing) Cerebellar: Finger-nose bradykinetic and does not reach target.  Unable to follow command for heel-to-shin testing Gait:  Deferred in acute setting    I have reviewed labs in epic and the results pertinent to this consultation are:  Basic Metabolic Panel: Recent Labs  Lab 12/24/22 0935 12/25/22 0940  NA 132* 137  K 4.0 4.0  CL 101 102  CO2 20* 24  GLUCOSE 133* 133*  BUN 27* 24*  CREATININE 1.32* 1.05  CALCIUM 8.6* 8.7*    CBC: Recent Labs  Lab 12/24/22 0935 12/25/22 0940  WBC 11.2* 6.7  HGB 13.2 11.4*  HCT 40.7 34.4*  MCV 98.1 96.6  PLT 395 342    Coagulation Studies: No results for input(s): "LABPROT", "INR" in the last 72 hours.    I have reviewed the images obtained:  MRI brain personally reviewed 1.  No acute intracranial process. 2. Stable mild chronic small-vessel disease.   Impression: Agree with medicine team that the description of his syncopal event is consistent with vasovagal syncope.  Certainly patients with Parkinson's disease tend to have orthostatic hypotension as well.  Agree with TED stockings and abdominal binder to reduce orthostatic hypotension symptoms.  He does have many parkinsonian features on examination including cogwheeling tremor, masked facies, bradykinesis, decrementing finger taps.  Family at bedside is not sure if he has the classic nonmotor features for Parkinson's disease (anosmia, REM sleep behavior disorder).  However atypical Parkinson's does tend to be less responsive to Sinemet and his good response to Sinemet here is encouraging.  Side effects to watch out with Sinemet include somnolence, headaches, nausea, worsening orthostatic hypotension, confusion/hallucinations and punding (purposeless preoccupation with sorting/tidying/purposeless disassembling of things etc.).   Recommendations: -Transition Sinemet 1 tab 10-100 to half a tablet of 25-100 (12.5-50 3 times daily)  -If he is tolerating this well without any of the above side effects after 1 to 2 weeks could increase to 1 tablet 3 times daily -If he has bothersome side  effects on the higher dose, should reduce back to 12.5-50 3 times daily -Continue planned outpatient follow-up with Dr. Sherryll Burger -Discussed with granddaughter at bedside who reported that the family does have MyChart access and will be able to see this note for guidance on side effects to watch out and medication adjustment plan; unfortunately due to other emergent consults I was unable to additionally call other family members to review plan -Discussed with primary team via secure chat  Brooke Dare MD-PhD [facility] [phone] [facility] coverage for [facility] is from 8 AM to 4 AM in-house and 4 PM to 8 PM by  telephone/video. 8 PM to 8 AM emergent questions or overnight urgent questions should be addressed to Teleneurology On-call or [facility] neurohospitalist; contact information can be found on AMION

## 2022-12-27 NOTE — Progress Notes (Signed)
Progress Note   Patient: Richard Castaneda VHQ:[id number] DOB: [birth year] DOA: 12/24/2022     1 DOS: the patient was seen and examined on 12/27/2022   Brief hospital course: Taken from H&P.  Richard Castaneda is a 89 y.o. male with medical history significant of HTN, hypothyroidism, BPH, presented with syncope.   Per son patient had a fall 86-month ago, broke his wrist and discharged from nursing home later on.  Since then continued to deteriorate, frequent episodes of confusion with gait and ambulation issues.  Recently home PT team recommended patient get evaluated for Parkinson's due to small shuffling gait and significant worsening of tremor and rigidity in his arms.  Son found patient on toilet, slumped over to the side but still sitting, unconscious, remained unconscious for about 5 to 7 minutes, he was cool to touch and appeared pale and breathing heavily during that time.  EMS was called.  While in the ED patient had another episode of syncope, lasted for 5 minutes, during that time patient was found drooling and there was significant disparity of blood pressure on right and left arm.  Due to concern of aortic aneurysm or dissection, CTA dissection was ordered and came back negative.  CT head was negative for any acute abnormality.  Chest x-ray negative for any acute infiltrate.   8/23: Hemodynamically stable, positive orthostatic vitals with 30 degrees drop from sitting to standing.  Some improvement in tremor with the start of Sinemet but it can worsen postural hypotension.  Based on his history and progressive decline over the past few months, concern of typical versus atypical Parkinson's.  Discussed with neurology and they were recommending outpatient neurology evaluation.  Also found to have significantly elevated TSH above 10, not sure whether he was taking his home Synthroid or not but dose was increased and need to have a close follow-up with PCP and repeat TSH in 3 to 4  weeks. Echocardiogram was normal with normal EF, grade 1 diastolic dysfunction and no other significant abnormality.  Giving some IV fluid. PT is recommending SNF, TOC to look for his benefit as he recently discharged from rehab.  8/24: Patient more somnolent and lethargic this morning, able to eat couple of bites with the help of son, just moving his extremities on deep sternal rub during morning rounds.  Getting ABG to rule out hypercarbia.  Palliative care is on board  8/25: Alert and talking, although slow.  Significant improvement in tremors and upper extremity rigidity.  Concern of aspiration with thin liquids, swallow team is recommending dysphagia 1 diet with nectar thick liquids.  Family is leaning toward taking him back home with hospice help, they would like to see neurologist and continue Sinemet as it seems helping.   Assessment and Plan: * Syncope Patient with continuous decline over the past few month, recently released from rehab. Outpatient PT and OT are concerning for Parkinson's due to worsening rigidity, shuffling gait and very slow response.  No diagnosis yet. Positive orthostatic vitals especially with standing.  CT head and MRI brain without any significant abnormality.  Echocardiogram with normal EF and only grade 1 diastolic dysfunction. TSH significantly elevated-not sure whether he was taking his medications regularly or not.  Repeat TSH with thyroid panel today was normal. Concern of vasovagal versus autonomic dysfunction with Parkinson's -Patient was started on Sinemet trial by admitting provider-improving tremors and upper extremity rigidity.  Discussed with daughter regarding potential side effect of postural hypotension and how to help patient. -Need outpatient neurology  evaluation -Palliative care consult-family is leaning towards taking him back home with hospice help -Continue with Sinemet for now  Fall at home, initial encounter Patient with worsening  ambulatory dysfunction, becoming very slow with shuffling gait and tremors.  Intermittent confusion and very slow response. -PT and OT are recommending SNF -Family is not leaning towards taking him back home with hospice help  PVD (peripheral vascular disease) (HCC) -Continue aspirin and statin  CKD (chronic kidney disease) stage 3, GFR 30-59 ml/min (HCC) Seems stable rather some improvement in creatinine from baseline.  Appears to have CKD stage IIIa. -Monitor renal function -Avoid nephrotoxins  Benign prostatic hyperplasia with urinary frequency -Continue home finasteride and Myrbetriq  Malnutrition of moderate degree -Consult to dietitian  Essential hypertension Blood pressure mildly elevated this morning.  Per son he was recently started back on amlodipine which was held for many months due to concern of hypotension. -Continue to monitor with as needed for now  Hypothyroidism TSH significantly elevated above 10, not sure whether he was taking his home Synthroid regularly or not.  It can also contribute with slow response and intermittent confusion.  Repeat TSH with thyroid panel was normal -Increasing the dose of home Synthroid. -Patient will need a repeat testing in 2 to 3 weeks   Subjective: Patient was resting comfortably when seen today.  Easily arousable and able to communicate and participate in care and exam.  Daughter at bedside.  Physical Exam: Vitals:   12/26/22 2057 12/27/22 0311 12/27/22 0455 12/27/22 0806  BP: (!) 164/71  (!) 153/63 (!) 150/67  Pulse: 80  67 68  Resp: 20  16 16   Temp: 97.7 F (36.5 C)  98.2 F (36.8 C) 97.8 F (36.6 C)  TempSrc: Oral  Oral   SpO2: 99%  98% 100%  Weight:  66.8 kg     General.  Frail elderly man, in no acute distress. Pulmonary.  Lungs clear bilaterally, normal respiratory effort. CV.  Regular rate and rhythm, no JVD, rub or murmur. Abdomen.  Soft, nontender, nondistended, BS positive. CNS.  Alert and oriented .  No  focal neurologic deficit. Extremities.  No edema, no cyanosis, pulses intact and symmetrical. Psychiatry.  Slow in response but appropriate  Data Reviewed: Prior data reviewed  Family Communication: Discussed with daughter at bedside  Disposition: Status is: Inpatient  Planned Discharge Destination: Skilled nursing facility/home with hospice  DVT prophylaxis.  Lovenox Time spent: 45 minutes  This record has been created using Conservation officer, historic buildings. Errors have been sought and corrected,but may not always be located. Such creation errors do not reflect on the standard of care.   Author: Arnetha Courser, MD 12/27/2022 12:37 PM  For on call review [email or web address].

## 2022-12-27 NOTE — Plan of Care (Signed)
  Problem: Education: Goal: Knowledge of condition and prescribed therapy will improve Outcome: Progressing   Problem: Cardiac: Goal: Will achieve and/or maintain adequate cardiac output Outcome: Progressing   Problem: Education: Goal: Knowledge of General Education information will improve Description: Including pain rating scale, medication(s)/side effects and non-pharmacologic comfort measures Outcome: Not Progressing   Problem: Health Behavior/Discharge Planning: Goal: Ability to manage health-related needs will improve Outcome: Not Progressing

## 2022-12-28 DIAGNOSIS — W19XXXA Unspecified fall, initial encounter: Secondary | ICD-10-CM | POA: Diagnosis not present

## 2022-12-28 DIAGNOSIS — I739 Peripheral vascular disease, unspecified: Secondary | ICD-10-CM | POA: Diagnosis not present

## 2022-12-28 DIAGNOSIS — E440 Moderate protein-calorie malnutrition: Secondary | ICD-10-CM | POA: Diagnosis not present

## 2022-12-28 DIAGNOSIS — R55 Syncope and collapse: Secondary | ICD-10-CM | POA: Diagnosis not present

## 2022-12-28 LAB — CBC
HCT: 35.5 % — ABNORMAL LOW (ref 39.0–52.0)
Hemoglobin: 11.9 g/dL — ABNORMAL LOW (ref 13.0–17.0)
MCH: 32 pg (ref 26.0–34.0)
MCHC: 33.5 g/dL (ref 30.0–36.0)
MCV: 95.4 fL (ref 80.0–100.0)
Platelets: 325 10*3/uL (ref 150–400)
RBC: 3.72 MIL/uL — ABNORMAL LOW (ref 4.22–5.81)
RDW: 12.5 % (ref 11.5–15.5)
WBC: 14.1 10*3/uL — ABNORMAL HIGH (ref 4.0–10.5)
nRBC: 0 % (ref 0.0–0.2)

## 2022-12-28 LAB — PROCALCITONIN: Procalcitonin: 0.19 ng/mL

## 2022-12-28 MED ORDER — BENZONATATE 100 MG PO CAPS: 100.0000 mg | ORAL_CAPSULE | Freq: Three times a day (TID) | ORAL | 0 refills | Status: DC | PRN

## 2022-12-28 MED ORDER — CARBIDOPA-LEVODOPA ER 25-100 MG PO TBCR: 0.5000 | EXTENDED_RELEASE_TABLET | Freq: Three times a day (TID) | ORAL | 2 refills | Status: DC

## 2022-12-28 MED ORDER — AMOXICILLIN-POT CLAVULANATE 400-57 MG/5ML PO SUSR: 500.0000 mg | Freq: Three times a day (TID) | ORAL | 0 refills | Status: DC

## 2022-12-28 MED ORDER — CIPROFLOXACIN HCL 0.3 % OP SOLN: 1.0000 [drp] | OPHTHALMIC | 0 refills | Status: DC

## 2022-12-28 MED ORDER — AMOXICILLIN-POT CLAVULANATE 400-57 MG/5ML PO SUSR
500.0000 mg | Freq: Three times a day (TID) | ORAL | Status: DC
  Administered 2022-12-28: 500 mg via ORAL
  Filled 2022-12-28: qty 6.25
  Filled 2022-12-28: qty 6.3
  Filled 2022-12-28: qty 10

## 2022-12-28 NOTE — Progress Notes (Signed)
Physical Therapy Treatment Patient Details Name: Richard Castaneda MRN: [id number] DOB: 1929-08-12 Today's Date: 12/28/2022   History of Present Illness Pt is a 89y/o male presenting due to 2 syncopal episodes, one while on the toilet at home and another in ED.  PMH includes history of falls, hypertension, mild dementia, CAD, CKD, peripheral vascular disease, and hypothyroidism. Per son, pt has a broken R wrist due to a fall from 64mo ago and is now able to weightbear through hand.    PT Comments  Pt resting in bed, wife at bedside, SpO2 91% on RA. Upon attempt to mobilize OOB, pt found to be incontinent of loose watery stool requiring assistance for hygiene. Pt required increased functional assist this session for bed mobility and transfers. Unable to progress gait due to fatigue, unsteadiness, and incontinence. Pt did not c/o dizziness with positional changes. Discussed d/c plans with pt and pt's wife for safe transition home with Hospice. Pt positioned comfortably in chair for lunch. Plan for d/c home this pm.    If plan is discharge home, recommend the following: A little help with walking and/or transfers;A little help with bathing/dressing/bathroom;Assistance with cooking/housework;Direct supervision/assist for medications management;Help with stairs or ramp for entrance;Assist for transportation;Direct supervision/assist for financial management   Can travel by private vehicle     Yes  Equipment Recommendations  None recommended by PT    Recommendations for Other Services       Precautions / Restrictions Precautions Precautions: Fall Precaution Comments: Hx of Orthostatic Hypotension Restrictions Weight Bearing Restrictions: No     Mobility  Bed Mobility Overal bed mobility: Needs Assistance Bed Mobility: Rolling, Supine to Sit Rolling: Mod assist   Supine to sit: Mod assist     General bed mobility comments: Increased assistance needed for bed mobility due to tremors and  weakness    Transfers Overall transfer level: Needs assistance Equipment used: Rolling walker (2 wheels) Transfers: Sit to/from Stand Sit to Stand: Mod assist           General transfer comment: Increased assistance to transfer from sit to stand complared to previous sessions    Ambulation/Gait Ambulation/Gait assistance: Min assist Gait Distance (Feet): 5 Feet Assistive device: Rolling walker (2 wheels) Gait Pattern/deviations: Shuffle, Trunk flexed       General Gait Details: Increased shuffle and tremors throughout. Unable to progress gait distance due to loose watery stool incontinence   Stairs             Wheelchair Mobility     Tilt Bed    Modified Rankin (Stroke Patients Only)       Balance Overall balance assessment: Needs assistance Sitting-balance support: Bilateral upper extremity supported, Feet supported Sitting balance-Leahy Scale: Fair     Standing balance support: Bilateral upper extremity supported, Reliant on assistive device for balance, During functional activity Standing balance-Leahy Scale: Fair                              Cognition Arousal: Alert Behavior During Therapy: WFL for tasks assessed/performed Overall Cognitive Status: Impaired/Different from baseline Area of Impairment: Orientation                 Orientation Level: Disoriented to, Situation, Time             General Comments: Pt more awake upon sitting EOB        Exercises      General Comments General comments (skin integrity,  edema, etc.): Discussed role of PT and current POC with pt and pt's wife. Answered all questions/concerns to facilitate safe transition home with Hospice.      Pertinent Vitals/Pain Pain Assessment Pain Assessment: No/denies pain    Home Living                          Prior Function            PT Goals (current goals can now be found in the care plan section) Acute Rehab PT Goals Patient  Stated Goal: return home    Frequency    Min 1X/week      PT Plan      Co-evaluation              AM-PAC PT "6 Clicks" Mobility   Outcome Measure  Help needed turning from your back to your side while in a flat bed without using bedrails?: A Little Help needed moving from lying on your back to sitting on the side of a flat bed without using bedrails?: A Little Help needed moving to and from a bed to a chair (including a wheelchair)?: A Little Help needed standing up from a chair using your arms (e.g., wheelchair or bedside chair)?: A Little Help needed to walk in hospital room?: A Little Help needed climbing 3-5 steps with a railing? : A Lot 6 Click Score: 17    End of Session Equipment Utilized During Treatment: Gait belt Activity Tolerance: Patient tolerated treatment well Patient left: in chair;with call bell/phone within reach;with chair alarm set;with family/visitor present Nurse Communication: Mobility status PT Visit Diagnosis: Unsteadiness on feet (R26.81);Other abnormalities of gait and mobility (R26.89);History of falling (Z91.81);Muscle weakness (generalized) (M62.81);Difficulty in walking, not elsewhere classified (R26.2);Dizziness and giddiness (R42)     Time: 2706-2376 PT Time Calculation (min) (ACUTE ONLY): 43 min  Charges:    $Gait Training: 8-22 mins $Therapeutic Exercise: 8-22 mins $Therapeutic Activity: 8-22 mins PT General Charges $$ ACUTE PT VISIT: 1 Visit                    Zadie Cleverly, PTA  Jannet Askew 12/28/2022, 2:03 PM

## 2022-12-28 NOTE — Progress Notes (Signed)
Patient being discharged home with Hospice. PIV removed. Went over discharged instructions with daughter. DNR in discharge packet. All questions were answered and patients daughter states that she understands. Patient will be going home via EMS.

## 2022-12-28 NOTE — TOC Transition Note (Signed)
Transition of Care [facility]) - CM/SW Discharge Note   Patient Details  Name: Richard Castaneda MRN: [id number] Date of Birth: [birth year]  Transition of Care [facility]) CM/SW Contact:  Allena Katz, LCSW Phone Number: 12/28/2022, 12:21 PM   Clinical Narrative:   Pt to discharge to home with home hospice and [facility] and always [facility]. [facility] to deliver DME around 3. Always [facility] to admit after. Medical necessity printed to unit. CSW to call for transport once pt is ready. DNR signed and on the chart.     Final next level of care: Home w Hospice Care Barriers to Discharge: Barriers Resolved   Patient Goals and CMS Choice CMS Medicare.gov Compare Post Acute Care list provided to:: Patient Choice offered to / list presented to : Adult Children  Discharge Placement                  Patient to be transferred to facility by: [facility]      Discharge Plan and Services Additional resources added to the After Visit Summary for   In-house Referral: Clinical Social Work   Post Acute Care Choice: Skilled Nursing Facility              Date DME Agency Contacted:  (Patient was active with [facility] prior to admission)         Date HH Agency Contacted:  (Patient was active with [facility] for PT/OT prior to admission)      Social Determinants of Health (SDOH) Interventions SDOH Screenings   Tobacco Use: Low Risk  (12/24/2022)     Readmission Risk Interventions    08/15/2021    3:18 PM  Readmission Risk Prevention Plan  Post Dischage Appt Complete  Medication Screening Complete  Transportation Screening Complete

## 2022-12-28 NOTE — Discharge Summary (Signed)
Physician Discharge Summary   Patient: Richard Castaneda MRN: [id number] DOB: [birth year]  Admit date:     12/24/2022  Discharge date: 12/28/22  Discharge Physician: Arnetha Courser   PCP: Marisue Ivan, MD   Recommendations at discharge:  Patient is being discharged with hospice care. Can follow-up with PCP and neurology if needed  Discharge Diagnoses: Principal Problem:   Syncope Active Problems:   Fall at home, initial encounter   PVD (peripheral vascular disease) (HCC)   CKD (chronic kidney disease) stage 3, GFR 30-59 ml/min (HCC)   Benign prostatic hyperplasia with urinary frequency   Malnutrition of moderate degree   Essential hypertension   Hypothyroidism   Tremor   Hospital Course: Taken from H&P.  Richard Castaneda is a 89 y.o. male with medical history significant of HTN, hypothyroidism, BPH, presented with syncope.   Per son patient had a fall 52-month ago, broke his wrist and discharged from nursing home later on.  Since then continued to deteriorate, frequent episodes of confusion with gait and ambulation issues.  Recently home PT team recommended patient get evaluated for Parkinson's due to small shuffling gait and significant worsening of tremor and rigidity in his arms.  Son found patient on toilet, slumped over to the side but still sitting, unconscious, remained unconscious for about 5 to 7 minutes, he was cool to touch and appeared pale and breathing heavily during that time.  EMS was called.  While in the ED patient had another episode of syncope, lasted for 5 minutes, during that time patient was found drooling and there was significant disparity of blood pressure on right and left arm.  Due to concern of aortic aneurysm or dissection, CTA dissection was ordered and came back negative.  CT head was negative for any acute abnormality.  Chest x-ray negative for any acute infiltrate.   8/23: Hemodynamically stable, positive orthostatic vitals with 30 degrees drop from  sitting to standing.  Some improvement in tremor with the start of Sinemet but it can worsen postural hypotension.  Based on his history and progressive decline over the past few months, concern of typical versus atypical Parkinson's.  Discussed with neurology and they were recommending outpatient neurology evaluation.  Also found to have significantly elevated TSH above 10, not sure whether he was taking his home Synthroid or not but dose was increased and need to have a close follow-up with PCP and repeat TSH in 3 to 4 weeks. Echocardiogram was normal with normal EF, grade 1 diastolic dysfunction and no other significant abnormality.  Giving some IV fluid. PT is recommending SNF, TOC to look for his benefit as he recently discharged from rehab.  8/24: Patient more somnolent and lethargic this morning, able to eat couple of bites with the help of son, just moving his extremities on deep sternal rub during morning rounds.  Getting ABG to rule out hypercarbia.  Palliative care is on board  8/25: Alert and talking, although slow.  Significant improvement in tremors and upper extremity rigidity.  Concern of aspiration with thin liquids, swallow team is recommending dysphagia 1 diet with nectar thick liquids.  Family is leaning toward taking him back home with hospice help, they would like to see neurologist and continue Sinemet as it seems helping.  8/26:  Cardiology evaluated him and make some changes to the dose of Sinemet.  Some coughing and upper respiratory congestion, mild leukocytosis today, concern of aspiration, chest x-ray with small left pleural effusion and no other significant infiltrate noted.  He  was started on 5-day course of Augmentin which she will continue on discharge to complete the course.  Sinemet dose was adjusted as recommended by neurology, side effects were explained to family and if continued to tolerate in next 2 to 3 weeks they can increase the dose to 1 tablet 3 times a day.   Patient will get benefit from outpatient neurology follow-up.  Family decided to go home with hospice help.  They will follow-up with providers according to hospice protocol and understand these restrictions very well.  Patient should be on dysphagia 1 diet with nectar thick liquids due to concern of aspiration.  Patient will continue on current medications and need to have a close follow-up either with hospice or primary care provider.  Assessment and Plan: * Syncope Patient with continuous decline over the past few month, recently released from rehab. Outpatient PT and OT are concerning for Parkinson's due to worsening rigidity, shuffling gait and very slow response.  No diagnosis yet. Positive orthostatic vitals especially with standing.  CT head and MRI brain without any significant abnormality.  Echocardiogram with normal EF and only grade 1 diastolic dysfunction. TSH significantly elevated-not sure whether he was taking his medications regularly or not.  Repeat TSH with thyroid panel today was normal. Concern of vasovagal versus autonomic dysfunction with Parkinson's -Patient was started on Sinemet trial by admitting provider-improving tremors and upper extremity rigidity.  Discussed with daughter regarding potential side effect of postural hypotension and how to help patient. -Need outpatient neurology evaluation -Palliative care consult-family is leaning towards taking him back home with hospice help -Continue with Sinemet for now  Fall at home, initial encounter Patient with worsening ambulatory dysfunction, becoming very slow with shuffling gait and tremors.  Intermittent confusion and very slow response. -PT and OT are recommending SNF -Family is not leaning towards taking him back home with hospice help  PVD (peripheral vascular disease) (HCC) -Continue aspirin and statin  CKD (chronic kidney disease) stage 3, GFR 30-59 ml/min (HCC) Seems stable rather some improvement in  creatinine from baseline.  Appears to have CKD stage IIIa. -Monitor renal function -Avoid nephrotoxins  Benign prostatic hyperplasia with urinary frequency -Continue home finasteride and Myrbetriq  Malnutrition of moderate degree -Consult to dietitian  Essential hypertension Blood pressure mildly elevated this morning.  Per son he was recently started back on amlodipine which was held for many months due to concern of hypotension. -Continue to monitor with as needed for now  Hypothyroidism TSH significantly elevated above 10, not sure whether he was taking his home Synthroid regularly or not.  It can also contribute with slow response and intermittent confusion.  Repeat TSH with thyroid panel was normal -Increasing the dose of home Synthroid. -Patient will need a repeat testing in 2 to 3 weeks       Consultants: Neurology.  Palliative care Procedures performed: None none Disposition: Hospice care Diet recommendation:  Discharge Diet Orders (From admission, onward)     Start     Ordered   12/28/22 0000  Diet - low sodium heart healthy        12/28/22 1343           Dysphagia type 1 nectar thick Liquid DISCHARGE MEDICATION: Allergies as of 12/28/2022       Reactions   Terazosin    Other reaction(s): Orthostatic hypotension        Medication List     STOP taking these medications    lisinopril 20 MG tablet Commonly known  as: ZESTRIL       TAKE these medications    acetaminophen 650 MG CR tablet Commonly known as: TYLENOL Take 650 mg by mouth 3 (three) times daily.   amLODipine 5 MG tablet Commonly known as: NORVASC Take 5 mg by mouth daily.   amoxicillin-clavulanate 400-57 MG/5ML suspension Commonly known as: AUGMENTIN Take 6.3 mLs (500 mg total) by mouth every 8 (eight) hours.   aspirin EC 81 MG tablet Take 81 mg by mouth daily.   benzonatate 100 MG capsule Commonly known as: TESSALON Take 1 capsule (100 mg total) by mouth 3 (three) times  daily as needed for cough.   Carbidopa-Levodopa ER 25-100 MG tablet controlled release Commonly known as: SINEMET CR Take 0.5 tablets by mouth 3 (three) times daily.   cholecalciferol 25 MCG (1000 UNIT) tablet Commonly known as: VITAMIN D3 Take 400 Units by mouth daily with supper.   ciprofloxacin 0.3 % ophthalmic solution Commonly known as: CILOXAN Place 1 drop into the left eye every 4 (four) hours while awake. Administer 1 drop, every 2 hours, while awake, for 2 days. Then 1 drop, every 4 hours, while awake, for the next 5 days.   DSS 100 MG Caps Take 100 mg by mouth daily as needed.   feeding supplement (GLUCERNA SHAKE) Liqd Take 237 mLs by mouth 3 (three) times daily between meals.   ferrous sulfate 325 (65 FE) MG tablet Take 325 mg by mouth daily with breakfast.   finasteride 5 MG tablet Commonly known as: PROSCAR TAKE 1 TABLET (5 MG TOTAL) BY MOUTH ONCE DAILY.   fluticasone 50 MCG/ACT nasal spray Commonly known as: FLONASE Place 1 spray into both nostrils daily.   Gemtesa 75 MG Tabs Generic drug: Vibegron Take 1 tablet (75 mg total) by mouth daily.   iVIZIA Dry Eyes 0.5 % Soln Generic drug: Povidone (PF) Apply 1 drop to eye daily.   levothyroxine 175 MCG tablet Commonly known as: SYNTHROID Take 175 mcg by mouth daily.   methocarbamol 500 MG tablet Commonly known as: ROBAXIN Take 1 tablet (500 mg total) by mouth every 8 (eight) hours as needed for muscle spasms.   multivitamin with minerals Tabs tablet Take 1 tablet by mouth daily.   simvastatin 10 MG tablet Commonly known as: ZOCOR Take 10 mg by mouth daily.   tamsulosin 0.4 MG Caps capsule Commonly known as: FLOMAX Take 0.4 mg by mouth daily.        Follow-up Information     Lonell Face, MD. Go on 01/21/2023.   Specialty: Neurology Why: at 2:30pm Contact information: [address] [facility] [place] [address] [phone]         Marisue Ivan,  MD. Schedule an appointment as soon as possible for a visit in 1 week(s).   Specialty: Family Medicine Contact information: [address] [facility] [place] [address] [phone]                Discharge Exam: Ceasar Mons Weights   12/26/22 0159 12/27/22 0311 12/28/22 0438  Weight: 67.6 kg 66.8 kg 66.1 kg   General.  Frail and ill-appearing elderly man, in no acute distress. Pulmonary.  Lungs clear bilaterally, normal respiratory effort. CV.  Regular rate and rhythm, no JVD, rub or murmur. Abdomen.  Soft, nontender, nondistended, BS positive. CNS.  Appears lethargic but awake.  No focal neurologic deficit. Extremities.  No edema, no cyanosis, pulses intact and symmetrical.  Condition at discharge: stable  The results  of significant diagnostics from this hospitalization (including imaging, microbiology, ancillary and laboratory) are listed below for reference.   Imaging Studies: DG Chest 2 View  Result Date: 12/27/2022 CLINICAL DATA:  Cough EXAM: CHEST - 2 VIEW COMPARISON:  X-ray 12/24/2022 FINDINGS: Small left effusion. Underinflation. Normal cardiopericardial silhouette with calcified aorta. No consolidation or pneumothorax. Chronic interstitial changes. IMPRESSION: Underinflation with a small left effusion. Electronically Signed   By: Karen Kays M.D.   On: 12/27/2022 15:30   MR BRAIN WO CONTRAST  Result Date: 12/25/2022 CLINICAL DATA:  Mental status change, persistent or worsening. EXAM: MRI HEAD WITHOUT CONTRAST TECHNIQUE: Multiplanar, multiecho pulse sequences of the brain and surrounding structures were obtained without intravenous contrast. COMPARISON:  MRI brain 08/15/2021.  Head CT 12/24/2022. FINDINGS: Brain: No acute infarct or hemorrhage. Stable mild chronic small-vessel disease. No mass or midline shift. No hydrocephalus or extra-axial collection. Foci of old hemorrhage in the right cerebellar hemisphere and right centrum semiovale. Vascular:  Normal flow voids. Skull and upper cervical spine: Normal marrow signal. Sinuses/Orbits: No acute findings. Other: None. IMPRESSION: 1. No acute intracranial process. 2. Stable mild chronic small-vessel disease. Electronically Signed   By: Orvan Falconer M.D.   On: 12/25/2022 16:11   ECHOCARDIOGRAM COMPLETE  Result Date: 12/25/2022    ECHOCARDIOGRAM REPORT   Patient Name:   LEONTAE LEISY Date of Exam: 12/25/2022 Medical Rec #:  [id number]      Height:       66.0 in Accession #:    [id number]     Weight:       148.8 lb Date of Birth:  1930-03-30      BSA:          1.764 m Patient Age:    89 years       BP:           148/66 mmHg Patient Gender: M              HR:           60 bpm. Exam Location:  [facility] Procedure: 2D Echo, Cardiac Doppler and Color Doppler Indications:     Syncope R55  History:         Patient has prior history of Echocardiogram examinations, most                  recent 08/15/2021. CAD. Moderate tricuspid regurge.  Sonographer:     [name] Referring Phys:  [id number] [facility] Isiac Breighner Diagnosing Phys: Debbe Odea MD  Sonographer Comments: Suboptimal apical window. IMPRESSIONS  1. Left ventricular ejection fraction, by estimation, is 55 to 60%. The left ventricle has normal function. The left ventricle has no regional wall motion abnormalities. Left ventricular diastolic parameters are consistent with Grade I diastolic dysfunction (impaired relaxation).  2. Right ventricular systolic function is normal. The right ventricular size is normal.  3. Left atrial size was moderately dilated.  4. The mitral valve is normal in structure. Mild mitral valve regurgitation.  5. The aortic valve is tricuspid. Aortic valve regurgitation is not visualized. Aortic valve sclerosis/calcification is present, without any evidence of aortic stenosis. FINDINGS  Left Ventricle: Left ventricular ejection fraction, by estimation, is 55 to 60%. The left ventricle has normal function. The left ventricle has no regional wall  motion abnormalities. The left ventricular internal cavity size was normal in size. There is  no left ventricular hypertrophy. Left ventricular diastolic parameters are consistent with Grade I diastolic dysfunction (impaired relaxation). Right Ventricle: The right ventricular  size is normal. No increase in right ventricular wall thickness. Right ventricular systolic function is normal. Left Atrium: Left atrial size was moderately dilated. Right Atrium: Right atrial size was normal in size. Pericardium: There is no evidence of pericardial effusion. Mitral Valve: The mitral valve is normal in structure. Mild mitral valve regurgitation. MV peak gradient, 5.3 mmHg. The mean mitral valve gradient is 3.0 mmHg. Tricuspid Valve: The tricuspid valve is normal in structure. Tricuspid valve regurgitation is mild. Aortic Valve: The aortic valve is tricuspid. Aortic valve regurgitation is not visualized. Aortic valve sclerosis/calcification is present, without any evidence of aortic stenosis. Aortic valve mean gradient measures 4.0 mmHg. Aortic valve peak gradient measures 6.8 mmHg. Aortic valve area, by VTI measures 1.87 cm. Pulmonic Valve: The pulmonic valve was normal in structure. Pulmonic valve regurgitation is not visualized. Aorta: The aortic root is normal in size and structure. Venous: The inferior vena cava was not well visualized. IAS/Shunts: No atrial level shunt detected by color flow Doppler.  LEFT VENTRICLE PLAX 2D LVIDd:         4.80 cm   Diastology LVIDs:         3.50 cm   LV e' medial:    6.64 cm/s LV PW:         1.10 cm   LV E/e' medial:  12.0 LV IVS:        1.40 cm   LV e' lateral:   9.36 cm/s LVOT diam:     2.00 cm   LV E/e' lateral: 8.5 LV SV:         59 LV SV Index:   33 LVOT Area:     3.14 cm  RIGHT VENTRICLE RV Basal diam:  3.00 cm RV Mid diam:    1.80 cm RV S prime:     12.20 cm/s TAPSE (M-mode): 2.6 cm LEFT ATRIUM            Index        RIGHT ATRIUM           Index LA diam:      4.30 cm  2.44 cm/m    RA Area:     13.30 cm LA Vol (A4C): 115.0 ml 65.20 ml/m  RA Volume:   31.60 ml  17.92 ml/m  AORTIC VALVE AV Area (Vmax):    1.70 cm AV Area (Vmean):   1.69 cm AV Area (VTI):     1.87 cm AV Vmax:           130.00 cm/s AV Vmean:          92.333 cm/s AV VTI:            0.314 m AV Peak Grad:      6.8 mmHg AV Mean Grad:      4.0 mmHg LVOT Vmax:         70.30 cm/s LVOT Vmean:        49.700 cm/s LVOT VTI:          0.187 m LVOT/AV VTI ratio: 0.60  AORTA Ao Root diam: 3.20 cm MITRAL VALVE                TRICUSPID VALVE MV Area (PHT): 3.27 cm     TR Peak grad:   31.1 mmHg MV Area VTI:   1.51 cm     TR Vmax:        279.00 cm/s MV Peak grad:  5.3 mmHg MV Mean grad:  3.0 mmHg  SHUNTS MV Vmax:       1.15 m/s     Systemic VTI:  0.19 m MV Vmean:      75.9 cm/s    Systemic Diam: 2.00 cm MV Decel Time: 232 msec MV E velocity: 79.70 cm/s MV A velocity: 107.00 cm/s MV E/A ratio:  0.74 Debbe Odea MD Electronically signed by Debbe Odea MD Signature Date/Time: 12/25/2022/1:05:58 PM    Final    DG Chest Portable 1 View  Result Date: 12/24/2022 CLINICAL DATA:  Altered level of consciousness, syncope EXAM: PORTABLE CHEST 1 VIEW COMPARISON:  10/12/2022 FINDINGS: Single frontal view of the chest demonstrates an unremarkable cardiac silhouette. There is increased central vascular congestion, without acute airspace disease, effusion, or pneumothorax. No acute bony abnormalities. External defibrillator pads overlie the lower chest. IMPRESSION: 1. Increased central vascular congestion without overt edema. Electronically Signed   By: Sharlet Salina M.D.   On: 12/24/2022 11:26   CT Angio Chest/Abd/Pel for Dissection W and/or W/WO  Result Date: 12/24/2022 CLINICAL DATA:  Acute aortic syndrome (AAS) suspected. Patient was found unresponsive in bathroom. EXAM: CT ANGIOGRAPHY CHEST, ABDOMEN AND PELVIS TECHNIQUE: Non-contrast CT of the chest was initially obtained. Multidetector CT imaging through the chest, abdomen and  pelvis was performed using the standard protocol during bolus administration of intravenous contrast. Multiplanar reconstructed images and MIPs were obtained and reviewed to evaluate the vascular anatomy. RADIATION DOSE REDUCTION: This exam was performed according to the departmental dose-optimization program which includes automated exposure control, adjustment of the mA and/or kV according to patient size and/or use of iterative reconstruction technique. CONTRAST:  OMNIPAQUE IOHEXOL 350 MG/ML SOLN COMPARISON:  CT scan chest, abdomen and pelvis from 12/15/2022. FINDINGS: CTA CHEST FINDINGS Cardiovascular: No evidence of intramural hematoma in the thoracic aorta on the unenhanced images. No evidence of thoracic aortic aneurysm, dissection or penetrating atherosclerotic ulcer. Mild cardiomegaly. No pericardial effusion. There are coronary artery calcifications, in keeping with coronary artery disease. There are also moderate to severe peripheral atherosclerotic vascular calcifications of thoracic aorta and its major branches. Mediastinum/Nodes: Visualized thyroid gland appears grossly unremarkable. No solid / cystic mediastinal masses. The esophagus is nondistended precluding optimal assessment. No axillary, mediastinal or hilar lymphadenopathy by size criteria. Lungs/Pleura: The central tracheo-bronchial tree is patent. There are dependent changes in bilateral lungs. No mass or consolidation. There is small left pleural effusion. No right pleural effusion. No suspicious lung nodules. Musculoskeletal: The visualized soft tissues of the chest wall are grossly unremarkable. No suspicious osseous lesions. There are mild multilevel degenerative changes in the visualized spine. Redemonstration of mild superior endplate deformities of T7 and T9 vertebrae, similar to the prior study. No significant retropulsion or spinal canal compromise. Redemonstration of several subacute/healing left rib fractures. Review of the  MIP images confirms the above findings. CTA ABDOMEN AND PELVIS FINDINGS VASCULAR Aorta: Normal caliber aorta without aneurysm, dissection, vasculitis or significant stenosis. Celiac: Patent without evidence of aneurysm, dissection, vasculitis or significant stenosis. SMA: Patent without evidence of aneurysm, dissection, vasculitis or significant stenosis. Renals: Both renal arteries are patent without evidence of aneurysm, dissection, vasculitis, fibromuscular dysplasia or significant stenosis. IMA: Patent without evidence of aneurysm, dissection, vasculitis or significant stenosis. Inflow: Patent without evidence of aneurysm, dissection, vasculitis or significant stenosis. Veins: No obvious venous abnormality within the limitations of this arterial phase study. Review of the MIP images confirms the above findings. NON-VASCULAR Hepatobiliary: The liver is normal in size. Non-cirrhotic configuration. No suspicious mass. No intrahepatic or extrahepatic bile duct  dilation. No calcified gallstones. Normal gallbladder wall thickness. No pericholecystic inflammatory changes. Pancreas: Unremarkable. No pancreatic ductal dilatation or surrounding inflammatory changes. Spleen: Within normal limits. No focal lesion. Adrenals/Urinary Tract: Adrenal glands are unremarkable. No suspicious renal mass. Redemonstration of several simple cysts in bilateral kidneys with largest sinus cyst in the left kidney lower pole measuring up to 2.2 x 3.2 cm. No hydronephrosis. No renal or ureteric calculi. Urinary bladder is under distended, precluding optimal assessment. However, no large mass or stones identified. No perivesical fat stranding. Stomach/Bowel: No disproportionate dilation of the small or large bowel loops. No evidence of abnormal bowel wall thickening or inflammatory changes. The appendix is unremarkable. Vascular/Lymphatic: No ascites or pneumoperitoneum. No abdominal or pelvic lymphadenopathy, by size criteria. Reproductive:  Enlarged prostate. Symmetric seminal vesicles. Other: Postsurgical changes from prior right inguinal hernia repair. The soft tissues and abdominal wall are otherwise unremarkable. Musculoskeletal: No suspicious osseous lesions. There are mild - moderate multilevel degenerative changes in the visualized spine. Redemonstration of small-to-moderate compression deformity of L1 vertebral body without significant interval change. No significant retropulsion or spinal canal compromise. Redemonstration of subacute/healing fractures of the right superomedial acetabulum and right inferior pubic ramus. Review of the MIP images confirms the above findings. IMPRESSION: 1. No evidence of aortic dissection, aneurysm or penetrating atherosclerotic ulcer. No intramural hematoma seen in the thoracic aorta. 2. Small left pleural effusion. 3. Redemonstration of multiple subacute/healing fractures of the left ribs, right acetabulum, and right inferior pubic ramus. 4. Redemonstration of compression deformities of T7, T9, and L1 vertebrae without significant interval change. 5. Multiple other nonacute observations, as described above. Electronically Signed   By: Jules Schick M.D.   On: 12/24/2022 10:44   CT Head Wo Contrast  Result Date: 12/24/2022 CLINICAL DATA:  Mental status change, unknown cause EXAM: CT HEAD WITHOUT CONTRAST TECHNIQUE: Contiguous axial images were obtained from the base of the skull through the vertex without intravenous contrast. RADIATION DOSE REDUCTION: This exam was performed according to the departmental dose-optimization program which includes automated exposure control, adjustment of the mA and/or kV according to patient size and/or use of iterative reconstruction technique. COMPARISON:  CT Head 12/15/22 FINDINGS: Brain: No evidence of acute infarction, hemorrhage, hydrocephalus, extra-axial collection or mass lesion/mass effect. Sequela of mild chronic microvascular ischemic change. Vascular: No  hyperdense vessel or unexpected calcification. Skull: Normal. Negative for fracture or focal lesion. Sinuses/Orbits: No middle ear or mastoid effusion. Paranasal sinuses are clear. Bilateral lens replacement. Orbits are unremarkable. Other: None IMPRESSION: No acute intracranial abnormality. Electronically Signed   By: Lorenza Cambridge M.D.   On: 12/24/2022 10:21   CT CHEST ABDOMEN PELVIS W CONTRAST  Result Date: 12/15/2022 CLINICAL DATA:  Fall, left-sided flank and back pain EXAM: CT CHEST, ABDOMEN, AND PELVIS WITH CONTRAST TECHNIQUE: Multidetector CT imaging of the chest, abdomen and pelvis was performed following the standard protocol during bolus administration of intravenous contrast. RADIATION DOSE REDUCTION: This exam was performed according to the departmental dose-optimization program which includes automated exposure control, adjustment of the mA and/or kV according to patient size and/or use of iterative reconstruction technique. CONTRAST:  OMNIPAQUE IOHEXOL 300 MG/ML  SOLN COMPARISON:  CT abdomen pelvis, 08/14/2021 FINDINGS: CT CHEST FINDINGS Cardiovascular: Aortic atherosclerosis. Normal heart size. Left and right coronary artery calcifications and stents. No pericardial effusion. Mediastinum/Nodes: No enlarged mediastinal, hilar, or axillary lymph nodes. Thyroid gland, trachea, and esophagus demonstrate no significant findings. Lungs/Pleura: Lungs are clear.  Trace pleural effusions. Musculoskeletal: No chest wall abnormality.  No acute osseous findings. Subacute, partially callused fractures of the anterior left third and fourth ribs (series 6, image 67). CT ABDOMEN PELVIS FINDINGS Hepatobiliary: No solid liver abnormality is seen. No gallstones, gallbladder wall thickening, or biliary dilatation. Pancreas: Unremarkable. No pancreatic ductal dilatation or surrounding inflammatory changes. Spleen: Normal in size without significant abnormality. Adrenals/Urinary Tract: Adrenal glands are  unremarkable. Simple, benign bilateral renal cortical cysts, for which no further follow-up or characterization is required. Kidneys are otherwise normal, without renal calculi, solid lesion, or hydronephrosis. Bladder is unremarkable. Stomach/Bowel: Stomach is within normal limits. Appendix appears normal. No evidence of bowel wall thickening, distention, or inflammatory changes. Vascular/Lymphatic: Aortic atherosclerosis. No enlarged abdominal or pelvic lymph nodes. Reproductive: No mass or other abnormality. Other: Evidence of prior right inguinal hernia repair.  No ascites. Musculoskeletal: Osteopenia. New, although age indeterminate superior endplate wedge deformity of L1 with approximately 30% anterior height loss (series 8, image 72). New, although nonacute sclerotic insufficiency fractures of the bilateral sacral ala (series 4, image 90). Subacute, partially callused fracture of the base of the right superior pubic ramus and anterior acetabulum (series 4, image 110). Subacute, partially callused fracture of the inferior right pubic ramus (series 4, image 118) IMPRESSION: 1. New, although age indeterminate superior endplate wedge deformity of L1 with approximately 30% anterior height loss. Correlate for acute pain and point tenderness. 2. New, although nonacute sclerotic insufficiency fractures of the bilateral sacral ala as well as subacute, partially callused fractures of the base of the right superior pubic ramus and anterior acetabulum as well as the right inferior pubic ramus. 3. Subacute, partially callused fractures of the anterior left third and fourth ribs. 4. No acute traumatic injury to the organs of the chest, abdomen, or pelvis. 5. Coronary artery disease. Aortic Atherosclerosis (ICD10-I70.0). Electronically Signed   By: Jearld Lesch M.D.   On: 12/15/2022 12:23   CT HEAD WO CONTRAST ( )  Result Date: 12/15/2022 CLINICAL DATA:  fall EXAM: CT HEAD WITHOUT CONTRAST CT CERVICAL SPINE WITHOUT  CONTRAST TECHNIQUE: Multidetector CT imaging of the head and cervical spine was performed following the standard protocol without intravenous contrast. Multiplanar CT image reconstructions of the cervical spine were also generated. RADIATION DOSE REDUCTION: This exam was performed according to the departmental dose-optimization program which includes automated exposure control, adjustment of the mA and/or kV according to patient size and/or use of iterative reconstruction technique. COMPARISON:  CT C Spine 10/12/22 FINDINGS: CT HEAD FINDINGS Brain: No evidence of acute infarction, hemorrhage, hydrocephalus, extra-axial collection or mass lesion/mass effect. Generalized volume loss. Sequela of mild chronic microvascular ischemic change. Vascular: No hyperdense vessel or unexpected calcification. Skull: Severe degenerative changes of the right TMJ. No acute calvarial fracture. Sinuses/Orbits: No middle ear or mastoid effusion. Paranasal sinuses are clear. Bilateral lens replacement. Orbits are otherwise unremarkable. Other: None. CT CERVICAL SPINE FINDINGS Alignment: Trace retrolisthesis of C4 on C5. Skull base and vertebrae: Compared to prior exam there is a mild acute appearing superior endplate compression deformity at C7 and an inferior endplate compression deformity at T1 (series 9, image 36). Soft tissues and spinal canal: No prevertebral fluid or swelling. No visible canal hematoma. Disc levels:  No CT evidence of high-grade spinal canal stenosis. Upper chest: Negative. Other: None IMPRESSION: 1. No CT evidence of intracranial injury 2. Mild acute appearing superior endplate compression deformity at C7 and an inferior endplate compression deformity at T1. Recommend correlation with point tenderness and consider further evaluation with a cervical spine MRI. Electronically Signed  By: Lorenza Cambridge M.D.   On: 12/15/2022 11:58   CT Cervical Spine Wo Contrast  Result Date: 12/15/2022 CLINICAL DATA:  fall EXAM:  CT HEAD WITHOUT CONTRAST CT CERVICAL SPINE WITHOUT CONTRAST TECHNIQUE: Multidetector CT imaging of the head and cervical spine was performed following the standard protocol without intravenous contrast. Multiplanar CT image reconstructions of the cervical spine were also generated. RADIATION DOSE REDUCTION: This exam was performed according to the departmental dose-optimization program which includes automated exposure control, adjustment of the mA and/or kV according to patient size and/or use of iterative reconstruction technique. COMPARISON:  CT C Spine 10/12/22 FINDINGS: CT HEAD FINDINGS Brain: No evidence of acute infarction, hemorrhage, hydrocephalus, extra-axial collection or mass lesion/mass effect. Generalized volume loss. Sequela of mild chronic microvascular ischemic change. Vascular: No hyperdense vessel or unexpected calcification. Skull: Severe degenerative changes of the right TMJ. No acute calvarial fracture. Sinuses/Orbits: No middle ear or mastoid effusion. Paranasal sinuses are clear. Bilateral lens replacement. Orbits are otherwise unremarkable. Other: None. CT CERVICAL SPINE FINDINGS Alignment: Trace retrolisthesis of C4 on C5. Skull base and vertebrae: Compared to prior exam there is a mild acute appearing superior endplate compression deformity at C7 and an inferior endplate compression deformity at T1 (series 9, image 36). Soft tissues and spinal canal: No prevertebral fluid or swelling. No visible canal hematoma. Disc levels:  No CT evidence of high-grade spinal canal stenosis. Upper chest: Negative. Other: None IMPRESSION: 1. No CT evidence of intracranial injury 2. Mild acute appearing superior endplate compression deformity at C7 and an inferior endplate compression deformity at T1. Recommend correlation with point tenderness and consider further evaluation with a cervical spine MRI. Electronically Signed   By: Lorenza Cambridge M.D.   On: 12/15/2022 11:58    Microbiology: Results for  orders placed or performed in visit on 08/19/22  Microscopic Examination     Status: Abnormal   Collection Time: 08/19/22  2:16 PM   Urine  Result Value Ref Range Status   WBC, UA 6-10 (A) 0 - 5 /hpf Final   RBC, Urine 0-2 0 - 2 /hpf Final   Epithelial Cells (non renal) 0-10 0 - 10 /hpf Final   Casts Present (A) None seen /lpf Final   Cast Type Hyaline casts N/A Final   Mucus, UA Present (A) Not Estab. Final   Bacteria, UA Moderate (A) None seen/Few Final  CULTURE, URINE COMPREHENSIVE     Status: Abnormal   Collection Time: 08/19/22  3:44 PM   Specimen: Urine   UR  Result Value Ref Range Status   Urine Culture, Comprehensive Final report (A)  Final   Organism ID, Bacteria Comment (A)  Final    Comment: Escherichia coli, identified by an automated biochemical system. Cefazolin <=4 ug/mL Cefazolin with an MIC <=16 predicts susceptibility to the oral agents cefaclor, cefdinir, cefpodoxime, cefprozil, cefuroxime, cephalexin, and loracarbef when used for therapy of uncomplicated urinary tract infections due to E. coli, Klebsiella pneumoniae, and Proteus mirabilis. 10,000-25,000 colony forming units per mL    ANTIMICROBIAL SUSCEPTIBILITY Comment  Final    Comment:       ** S = Susceptible; I = Intermediate; R = Resistant **                    P = Positive; N = Negative             MICS are expressed in micrograms per mL    Antibiotic  RSLT#1    RSLT#2    RSLT#3    RSLT#4 Amoxicillin/Clavulanic Acid    S Ampicillin                     S Cefepime                       S Ceftriaxone                    S Cefuroxime                     S Ciprofloxacin                  S Ertapenem                      S Gentamicin                     S Imipenem                       S Levofloxacin                   S Meropenem                      S Nitrofurantoin                 S Piperacillin/Tazobactam        S Tetracycline                   S Tobramycin                      S Trimethoprim/Sulfa             S     Labs: CBC: Recent Labs  Lab 12/24/22 0935 12/25/22 0940 12/28/22 0455  WBC 11.2* 6.7 14.1*  HGB 13.2 11.4* 11.9*  HCT 40.7 34.4* 35.5*  MCV 98.1 96.6 95.4  PLT 395 342 325   Basic Metabolic Panel: Recent Labs  Lab 12/24/22 0935 12/25/22 0940  NA 132* 137  K 4.0 4.0  CL 101 102  CO2 20* 24  GLUCOSE 133* 133*  BUN 27* 24*  CREATININE 1.32* 1.05  CALCIUM 8.6* 8.7*   Liver Function Tests: Recent Labs  Lab 12/24/22 0954  AST 28  ALT 22  ALKPHOS 91  BILITOT 0.9  PROT 6.9  ALBUMIN 3.6   CBG: Recent Labs  Lab 12/26/22 0502 12/26/22 0803 12/26/22 1120 12/27/22 0452 12/27/22 2051  GLUCAP 111* 105* 112* 102* 149*    Discharge time spent: greater than 30 minutes.  This record has been created using Conservation officer, historic buildings. Errors have been sought and corrected,but may not always be located. Such creation errors do not reflect on the standard of care.   Signed: Arnetha Courser, MD [facility] 12/28/2022

## 2022-12-28 NOTE — Progress Notes (Signed)
Speech Language Pathology Treatment: Dysphagia  Patient Details Name: Trance Trefry MRN: [id number] DOB: Apr 09, 1930 Today's Date: 12/28/2022 Time: 0900-0930 SLP Time Calculation (min) (ACUTE ONLY): 30 min  Assessment / Plan / Recommendation Clinical Impression  Pt seen for follow up dysphagia intervention, with focus on compensatory strategy training and education regarding aspiration risk and comfort feeding measures with pt and daughter Stanton Kidney). Per chart review, family with plan to take pt home with hospice; SLP verified with MD regarding plan to pursue more comfort measures approach. Upon entrance to room daughter with many questions regarding current diet and application to home setting. Therapist reinforcing the goal of optimizing safety in regards to safe swallow/aspiration precautions (slow rate, small bites, elevated HOB, and alert for PO intake) and liberalizing diet for accomodation of pt's preferred items (Peps[employer] and meat). Further education provided regarding general risk of aspiration, importance of oral care, and supervision during intake to ensure compliance with recommendations. Daughter reported understanding. Pt seen with trials of nectar thick liquids- impulsive straw sips noted, with verbal cues provided for slowing rate and single sips. No s/sx of aspiration noted across trials. Based on daughter request, pt will continue on Nectar thick liquids with allowance of Dys 2 (fine chopped) solids with aspiration precautions as stated above and supervision. All questions answered upton therapist retreat. No further acute SLP services indicated.     HPI HPI: Pt is a 89y/o male presenting due to 2 syncopal episodes, one while on the toilet at home and another in ED.  PMH includes history of falls, hypertension, mild dementia, CAD, CKD, peripheral vascular disease, and hypothyroidism. MRI 12/25/22, "1. No acute intracranial process.  2. Stable mild chronic small-vessel disease." CXR, 12/27/22,  "Underinflation with a small left effusion."      SLP Plan  Discharge SLP treatment due to (comment) (transition to comfort measures)      Recommendations for follow up therapy are one component of a multi-disciplinary discharge planning process, led by the attending physician.  Recommendations may be updated based on patient status, additional functional criteria and insurance authorization.    Recommendations  Diet recommendations: Nectar-thick liquid;Dysphagia 2 (fine chop) Liquids provided via: Straw Medication Administration: Crushed with puree Supervision: Full supervision/cueing for compensatory strategies;Staff to assist with self feeding Compensations: Minimize environmental distractions;Slow rate;Small sips/bites Postural Changes and/or Swallow Maneuvers: Seated upright 90 degrees                             Discharge SLP treatment due to (comment) (transition to comfort measures)   [place] Alivya Wegman Clapp  MS [facility] SLP   [place] J Clapp  12/28/2022, 9:50 AM

## 2023-01-15 ENCOUNTER — Ambulatory Visit: Payer: Medicare Other | Admitting: Urology

## 2023-05-05 DEATH — deceased

## 2023-09-14 ENCOUNTER — Ambulatory Visit: Payer: Self-pay | Admitting: Urology
